# Patient Record
Sex: Female | Born: 1960 | Race: Black or African American | Hispanic: No | Marital: Single | Smoking: Current every day smoker
Health system: Southern US, Community
[De-identification: ages and names within clinical notes are randomized; demographics above are authoritative.]

## PROBLEM LIST (undated history)

## (undated) DIAGNOSIS — J302 Other seasonal allergic rhinitis: Secondary | ICD-10-CM

## (undated) DIAGNOSIS — K219 Gastro-esophageal reflux disease without esophagitis: Secondary | ICD-10-CM

## (undated) DIAGNOSIS — K089 Disorder of teeth and supporting structures, unspecified: Secondary | ICD-10-CM

## (undated) DIAGNOSIS — I509 Heart failure, unspecified: Secondary | ICD-10-CM

## (undated) DIAGNOSIS — I10 Essential (primary) hypertension: Secondary | ICD-10-CM

## (undated) HISTORY — DX: Disorder of teeth and supporting structures, unspecified: K08.9

## (undated) HISTORY — DX: Heart failure, unspecified: I50.9

## (undated) HISTORY — DX: Essential (primary) hypertension: I10

## (undated) HISTORY — DX: Other seasonal allergic rhinitis: J30.2

## (undated) NOTE — *Deleted (*Deleted)
***In Progress*** Referring Physician: Dr. Shirlean Mylar [facility]) Primary Care: Dr. Shirlean Mylar [facility]) Primary Cardiologist: Dr. Eden Emms (not seen since 2016) AHFC: Dr. Gala Romney  HPI:  Angela Serrano is a 89 y/o woman with HTN, GERD with esophageal stricture s/p dilation in 2016 and tobacco use referred by Dr. Leary Roca for further evaluation of her HF.  Saw Dr. Eden Emms in 2016 for moderate pericardial effusion found incidentally on chest CT. TEE 1/16 EF 55-60% and normal valves. Treated with NSAIDs for pleuropericarditis.   Echo 4/21 EF 35-40% global HK grade II DD.   We saw her for the first time in May 2021. Was not feeling well. NYHA III. Underwent R/L cath which looked good. EF 40-45% no CAD. Normal RHC. She was ordered to get a cMRI but this had not been done yet.  She recently presented to clinic 05/20/20 with her daughter. Was doing better from a symptom volume standpoint. Denied exertional dyspnea. No orthopnea/ PND. No LEE. Checked weight every morning. Weight was stable at home, 129 lbs. Reported full medication compliance. Her BP remained elevated, 162/90. SBP in the 150s at home. Was still smoking <1/2 ppd and drinking 3-4 40 oz beers a day.   Today he returns to HF clinic for pharmacist medication titration. At last visit with pharmacy clinic on 06/13/20, carvedilol 3.125 mg BID was initiated and Entresto was increased to 49/51 BID. Patient previously using losartan and Entresto with dizziness/lightheadedness, patient stopped losartan after calling her pharmacy.  . Shortness of breath/dyspnea on exertion? {YES [id number]  . Orthopnea/PND? {YES [id number] . Edema? {YES [id number] . Lightheadedness/dizziness? {YES [id number] . Daily weights at home? {YES [id number] . Blood pressure/heart rate monitoring at home? {YES [id number] . Following low-sodium/fluid-restricted diet? {YES NO:22349}  HF Medications: Carvedilol 3.125 mg BID  Entresto 49/51 mg BID  Spironolactone 25 mg  daily   Has the patient been experiencing any side effects to the medications prescribed?  {YES NO:22349}  Does the patient have any problems obtaining medications due to transportation or finances?   {YES NO:22349}  Understanding of regimen: {excellent/good/fair/poor:19665} Understanding of indications: {excellent/good/fair/poor:19665} Potential of compliance: {excellent/good/fair/poor:19665} Patient understands to avoid NSAIDs. Patient understands to avoid decongestants.    Pertinent Lab Values 05/28/20: . Serum creatinine 0.77, BUN 17, Potassium 3.9, Sodium 142, BNP 209.2 (04/18/20)  Vital Signs: . Weight: *** (last clinic weight: 130 lbs) . Blood pressure: ***  . Heart rate: ***   Assessment: 1. Chronic systolic HF/chest pressure - Echo 4/21 EF 35-40% global HK grade II DD. Unclear etiology suspect HTN +/- ETOH - R/L Cath 5/21 Normal cors, EF 40-45% RHC ok -NYHA II, Volume status ok, BP elevated - Not currently taking any diuretics - Increase carvedilol to 6.25 mg BID? - Continue Entresto to 49/51 mg BID - Continue spironolactone 12.5 mg daily  - Eventual addition of SGLT2i  - we discussed at length need for ETOH cessation and better control of BP  - Working on scheduling cMRI  - continue gradual med titration q2-3 weeks.   2. Severe HTN - suspect cause of CM - remains elevated, 152/86 in clinic - Start carvedilol and increase Entresto as above. Continue spironolactone.  - advised to quit drinking and smoking    3. ETOH use - discussed possibility that this could be cause of reduced EF - she continues to drink 3-4 40 oz beers a day  - advised need for cessation.  - she will work to reduce intake   4. Tobacco use - still  smoking <1/2 ppd  - encouraged cessation   Plan: 1) Medication changes:  2) Follow-up: 3 weeks with Pharmacy Clinic   Plan: 1) Medication changes: Based on clinical presentation, vital signs and recent labs will *** 2) Labs: *** 3)  Follow-up: ***   Karle Plumber, PharmD, BCPS, BCCP, CPP Heart Failure Clinic Pharmacist [phone]

---

## 1988-09-21 HISTORY — PX: TUBAL LIGATION: SHX77

## 1999-05-29 ENCOUNTER — Emergency Department (HOSPITAL_COMMUNITY): Admission: EM | Admit: 1999-05-29 | Discharge: 1999-05-29 | Payer: Self-pay | Admitting: Emergency Medicine

## 2000-05-25 ENCOUNTER — Emergency Department (HOSPITAL_COMMUNITY): Admission: EM | Admit: 2000-05-25 | Discharge: 2000-05-25 | Payer: Self-pay | Admitting: *Deleted

## 2002-09-21 HISTORY — PX: MYOMECTOMY: SHX85

## 2003-04-24 ENCOUNTER — Inpatient Hospital Stay (HOSPITAL_COMMUNITY): Admission: AD | Admit: 2003-04-24 | Discharge: 2003-04-26 | Payer: Self-pay | Admitting: Obstetrics and Gynecology

## 2005-11-20 ENCOUNTER — Other Ambulatory Visit: Admission: RE | Admit: 2005-11-20 | Discharge: 2005-11-20 | Payer: Self-pay | Admitting: Family Medicine

## 2006-08-06 ENCOUNTER — Ambulatory Visit (HOSPITAL_COMMUNITY): Admission: RE | Admit: 2006-08-06 | Discharge: 2006-08-06 | Payer: Self-pay | Admitting: Family Medicine

## 2007-08-03 ENCOUNTER — Ambulatory Visit (HOSPITAL_COMMUNITY): Admission: RE | Admit: 2007-08-03 | Discharge: 2007-08-03 | Payer: Self-pay | Admitting: Family Medicine

## 2007-10-19 ENCOUNTER — Emergency Department (HOSPITAL_COMMUNITY): Admission: EM | Admit: 2007-10-19 | Discharge: 2007-10-19 | Payer: Self-pay | Admitting: Family Medicine

## 2009-03-04 ENCOUNTER — Emergency Department (HOSPITAL_COMMUNITY): Admission: EM | Admit: 2009-03-04 | Discharge: 2009-03-04 | Payer: Self-pay | Admitting: Family Medicine

## 2009-09-21 DIAGNOSIS — J302 Other seasonal allergic rhinitis: Secondary | ICD-10-CM

## 2009-09-21 HISTORY — DX: Other seasonal allergic rhinitis: J30.2

## 2011-02-06 NOTE — H&P (Signed)
NAME:  Angela Serrano, Angela Serrano                              ACCOUNT NO.:  [id number]   MEDICAL RECORD NO.:  [id number]                   PATIENT TYPE:  INP   LOCATION:  NA                                   [facility]ILITY[facility]  [facility]   PHYSICIAN:  Janine Limbo, M.D.            DATE OF BIRTH:  13-Apr-1961   DATE OF ADMISSION:  DATE OF DISCHARGE:                                HISTORY & PHYSICAL   HISTORY OF PRESENT ILLNESS:  [name] is a 50 year old female, para 1-1-1-3,  who presents with large fibroids for an abdominal myomectomy.  The patient  complains of pelvic pressure and pelvic pain.  Her most recent Pap smear was  within normal limits.  Cultures of her cervix are negative.  The patient is  not sexually active, though she does not want to have a hysterectomy at this  time.  An ultrasound was performed that showed a 12.6 x 8.7 x 8.6 cm uterus.  There were multiple fibroids present and the largest measures 7.3 cm in  size.  The patient is status post tubal ligation.  She denies a past history  of sexually transmitted infections.   ALLERGIES:  No known drug allergies.   OBSTETRICAL HISTORY:  The patient has had one full term vaginal delivery and  one preterm vaginal delivery.  She has had twins.  She had one elective  pregnancy termination.   PAST MEDICAL HISTORY:  The patient has a history of anemia.  She also has a  history of sickle cell trait.  She is not currently taking any medications  on a regular basis.   SOCIAL HISTORY:  The patient smokes six cigarettes each day.  She drinks  beer occasionally.  She denies other recreational drug uses.   REVIEW OF SYSTEMS:  Please see history of present illness.   FAMILY HISTORY:  The patient has a family history of asthma, sickle cell  trait, hypertension, diabetes, seizures, migraine headaches, and strokes.   PHYSICAL EXAMINATION:  VITAL SIGNS:  Weight is 113 pounds.  HEENT:  Within normal limits.  CHEST:  Clear.  CARDIOVASCULAR:  Regular rate  and rhythm.  BREASTS:  No masses.  ABDOMEN:  Nontender and there is a firm pelvic mass present.  EXTREMITIES:  Within normal limits.  NEUROLOGIC:  Grossly normal.  PELVIC:  External genitalia is normal. The vagina is normal except for  relaxation.  Cervix is nontender.  The uterus is 12 to 14-week size,  anterior, and firm.  Adnexa has no masses appreciated.  Rectovaginal  examination confirms.   ASSESSMENT:  Symptomatic fibroids.    PLAN:  The patient will undergo an abdominal myomectomy.  She understands  the indications for her procedure and she accepts the risks of, but not  limited to, anesthetic complications, bleeding, infections, and possible  damage to the surrounding organs.  Janine Limbo, M.D.    AVS/MEDQ  D:  04/19/2003  T:  04/19/2003  Job:  [id number]

## 2011-02-06 NOTE — Discharge Summary (Signed)
NAMEACCALIA, Angela Serrano                              ACCOUNT NO.:  [id number]   MEDICAL RECORD NO.:  [id number]                   PATIENT TYPE:  INP   LOCATION:  9109                                 [facility]ACILITY:  [facility]   PHYSICIAN:  Janine Limbo, M.D.            DATE OF BIRTH:  [birth year]   DATE OF ADMISSION:  04/24/2003  DATE OF DISCHARGE:  04/26/2003                                 DISCHARGE SUMMARY   DISCHARGE DIAGNOSES:  Symptomatic uterine fibroids.   OPERATION:  On the date of admission patient underwent an abdominal  myomectomy, tolerating procedure well.  The patient was found to have a  multiple fibroid uterus with three large fibroids, the largest measuring 6 x  8 cm with aggregate weight of 315 g.  The patient had normal appearing tubes  and ovaries bilaterally.  Do note, however, that her tubes had been  previously ligated.   HISTORY OF PRESENT ILLNESS:  Angela Serrano is a 50 year old female para 1-1-1-3  who presents for myomectomy because of large symptomatic uterine fibroids.  Please see patient's dictated history and physical examination for details.   PHYSICAL EXAMINATION:  VITAL SIGNS:  Weight 113 pounds.  GENERAL:  Within normal limits.  ABDOMEN:  Nontender, firm mass arising from the pelvis to the level of her  umbilicus.  PELVIC:  External genitalia is normal.  Vagina is normal except for  relaxation.  Cervix is nontender.  Uterus is 12-14 weeks size, anterior, and  firm.  Adnexa has no appreciable masses.  Rectovaginal examination confirms.   HOSPITAL COURSE:  On the day of admission patient underwent aforementioned  procedure, tolerating it well.  Postoperative course was unremarkable with  patient resuming bowel and bladder function by postoperative day #2 and  therefore deemed ready for discharge home.  Postoperative hemoglobin 10.0  (preoperative hemoglobin 13.2).   DISCHARGE MEDICATIONS:  1. Vicodin one to two tablets q.6h. as needed for pain.  2. Phenergan  25 mg one tablet q.6h. as needed for nausea.  3. Ibuprofen 600 mg one tablet with food q.6h. for five days, then as needed     for pain.  4. Colace 100 mg one tablet b.i.d. until bowel movements are regular.  5. Iron 325 mg one tablet b.i.d. for six weeks.  6. Mylanta Gas chewables one tablet one-half hour after meals and at     bedtime.   DISCHARGE INSTRUCTIONS:  The patient was given a copy of [facility] postoperative sheet.  She was further advised to avoid driving for  two weeks, heavy lifting for four weeks, and intercourse for six weeks.   FOLLOWUP:  Follow-up appointment is scheduled with Janine Limbo, M.D.  on May 31, 2003 at 2:30 p.m.    PATHOLOGY:  Leiomyomata (two) and portion of endometrium with proliferative  phase changes and underlying myometrium with adenomyosis and focal  adenomyoma.  No  atypia or malignancy identified.     Elmira J. Adline Peals.                    Janine Limbo, M.D.    EJP/MEDQ  D:  05/21/2003  T:  05/22/2003  Job:  [id number]

## 2011-02-06 NOTE — Op Note (Signed)
NAME:  Angela Serrano, Angela Serrano                              ACCOUNT NO.:  [id number]   MEDICAL RECORD NO.:  [id number]                   PATIENT TYPE:  INP   LOCATION:  9109                                 [facility]ILITY:  [facility]   PHYSICIAN:  Janine Limbo, M.D.            DATE OF BIRTH:  07/26/1961   DATE OF PROCEDURE:  04/24/2003  DATE OF DISCHARGE:                                 OPERATIVE REPORT   PREOPERATIVE DIAGNOSIS:  Symptomatic fibroids.   POSTOPERATIVE DIAGNOSIS:  Symptomatic fibroids.   PROCEDURE:  Multiple abdominal myomectomy.   SURGEON:  Janine Limbo, M.D.   FIRST ASSISTANT:  Elmira J. Adline Peals.   ANESTHESIA:  General.   DISPOSITION:  Angela Serrano is a 50 year old female, para 1-1-1-3, who presents  with the above-mentioned diagnosis.  She emphatically declines hypertension.  She understands the indications for her procedure, and she accepts the risks  of, but not limited to, anesthetic complications, bleeding, infection, and  possible damage to the surrounding organs.   FINDINGS:  The patient had a multiple fibroid uterus.  The largest fibroid  removed was 6.6 x 8 cm.  The weight was 315 g.  The overall uterus was  approximately 12-14 weeks' size.  The ovaries are normal.  The fallopian  tubes were normal except for defects in the tubes from her prior tubal  ligation.  If the patient should conceive in the future, then a cesarean  section is recommended.   DESCRIPTION OF PROCEDURE:  The patient was taken to the operating room,  where a general anesthetic was given.  The patient's abdomen, perineum, and  vagina were prepped with multiple layers of Betadine.  A Foley catheter was  placed in the bladder.  The patient was sterilely draped.  A low transverse  area was injected with 20 mL of 0.5% Marcaine.  A low transverse incision  was made and carried sharply through the subcutaneous tissue, the fascia,  and the anterior peritoneum.  The abdominal wall retractor was  placed.  The  bowel was packed cephalad.  The uterus was elevated into the operative  field.  A 6 cm incision was made on the anterior portion of the uterus after  injecting the anterior portion of the uterus with Pitressin and saline.  The  incision was extended down to the level of the fibroid.  A large fibroid was  removed using a combination of sharp and blunt dissection.  There was an  area of adenomyosis present that was also sharply removed.  There was a 3 x  4 cm ________ present on the right posterior fundus of the uterus, and this  was removed after injecting the uterine surface with Pitressin and saline.  All defects were then closed using deep sutures of 0 Vicryl followed by  superficial sutures of 3-0 Vicryl.  Hemostasis was noted to be adequate  throughout.  The pelvis  was vigorously irrigated.  Interceed was placed over  the uterine incisions.  All instruments and packs were removed.  The  anterior peritoneum was closed using a running suture of 3-0 Vicryl.  The  fascia was closed using a running suture of 0 Vicryl, followed by three  interrupted sutures of 0 Vicryl.  The subcutaneous tissue was irrigated.  Hemostasis was confirmed.  The skin was reapproximated using  skin staples.  Sponge, needle, and instrument counts were correct on two  occasions.  The estimated blood loss was 150 mL.  The patient tolerated her  procedure well.  She was awakened from her anesthetic without difficulty and  taken to the recovery room in stable condition.  Pictures were taken of the  patient's fibroids.                                               Janine Limbo, M.D.    AVS/MEDQ  D:  04/24/2003  T:  04/25/2003  Job:  [id number]

## 2011-06-19 ENCOUNTER — Emergency Department (HOSPITAL_COMMUNITY)
Admission: EM | Admit: 2011-06-19 | Discharge: 2011-06-19 | Disposition: A | Discharge status: Home / Self Care | Payer: Self-pay | Attending: Emergency Medicine | Admitting: Emergency Medicine

## 2011-06-19 DIAGNOSIS — R220 Localized swelling, mass and lump, head: Secondary | ICD-10-CM | POA: Insufficient documentation

## 2011-06-19 DIAGNOSIS — K089 Disorder of teeth and supporting structures, unspecified: Secondary | ICD-10-CM | POA: Insufficient documentation

## 2011-12-10 ENCOUNTER — Encounter: Payer: Self-pay | Admitting: Obstetrics & Gynecology

## 2011-12-16 ENCOUNTER — Encounter: Payer: Self-pay | Admitting: Advanced Practice Midwife

## 2011-12-24 ENCOUNTER — Encounter: Payer: Self-pay | Admitting: Advanced Practice Midwife

## 2011-12-24 ENCOUNTER — Ambulatory Visit (INDEPENDENT_AMBULATORY_CARE_PROVIDER_SITE_OTHER): Payer: 59 | Admitting: Advanced Practice Midwife

## 2011-12-24 VITALS — BP 129/79 | HR 82 | Temp 97.8°F | Ht 61.0 in | Wt 132.3 lb

## 2011-12-24 DIAGNOSIS — Z01419 Encounter for gynecological examination (general) (routine) without abnormal findings: Secondary | ICD-10-CM

## 2011-12-24 DIAGNOSIS — N6310 Unspecified lump in the right breast, unspecified quadrant: Secondary | ICD-10-CM

## 2011-12-24 DIAGNOSIS — N63 Unspecified lump in unspecified breast: Secondary | ICD-10-CM

## 2011-12-24 NOTE — Patient Instructions (Signed)
Place premenopausal annual exam patient instructions here.  °

## 2011-12-24 NOTE — Progress Notes (Signed)
Patient states has irregular periods, sometimes spots, sometimes light period

## 2011-12-24 NOTE — Progress Notes (Signed)
  Subjective:     Angela Serrano is a 51 y.o. female here for a routine exam.  Current complaints: None.     Gynecologic History Patient's last menstrual period was 12/04/2011. Contraception: abstinence Last Pap: 2009. Results were: normal Last mammogram: 2008. Results were: normal  Obstetric History OB History    Grav Para Term Preterm Abortions TAB SAB Ect Mult Living   3 2 2  1 1          # Outc Date GA Lbr Len/2nd Wgt Sex Del Anes PTL Lv   1 TAB 1982           2 TRM 1988     SVD      3 TRM 1990     SVD          The following portions of the patient's history were reviewed and updated as appropriate: allergies, current medications, past family history, past medical history, past social history, past surgical history and problem list.  Review of Systems A comprehensive review of systems was negative.    Objective:    BP 129/79  Pulse 82  Temp(Src) 97.8 F (36.6 C) (Oral)  Ht 5\' 1"  (1.549 m)  Wt 60.011 kg (132 lb 4.8 oz)  BMI 25.00 kg/m2  LMP 12/04/2011  General Appearance:    Alert, cooperative, no distress, appears stated age  Head:    Normocephalic, without obvious abnormality, atraumatic  Eyes:    PERRL, conjunctiva/corneas clear,         Nose:   Nares normal     Neck:   Supple, symmetrical, trachea midline, no adenopathy;    thyroid:  no enlargement/tenderness/nodules  Back:     Symmetric, no curvature, ROM normal, no CVA tenderness  Lungs:     Clear to auscultation bilaterally, respirations unlabored  Chest Wall:    No tenderness or deformity   Heart:    Regular rate and rhythm, S1 and S2 normal, no murmur, rub   or gallop  Breast Exam:    No tenderness, two 1 x 0.5 cm masses at 6 o'clock on right breast, no nipple abnormality or drainage  Abdomen:     Soft, non-tender, bowel sounds active all four quadrants,    no masses, no organomegaly  Genitalia:    Normal female without lesion, discharge or tenderness     Extremities:   Extremities normal, atraumatic,  no cyanosis or edema     Skin:   Skin color, texture, turgor normal, no rashes or lesions     Neurologic:  A&Ox4, Grossly normal      Assessment:    Healthy female exam.  Right breast mass   Plan:    Mammogram ordered. Follow up in: 1 year. Pap  1. Visit for routine gyn exam  Cytology - PAP  2. Mass of breast, right  MM Digital Diagnostic Bilat, US Breast Right     Biddie Sebek

## 2011-12-28 ENCOUNTER — Telehealth: Payer: Self-pay | Admitting: *Deleted

## 2011-12-28 NOTE — Telephone Encounter (Signed)
Message copied by Mannie Stabile on Mon Dec 28, 2011  3:08 PM ------      Message from: Sherre Lain A      Created: Thu Dec 24, 2011  5:06 PM      Regarding: Appointment       Pt needs an appointment scheduled for a mammogram at the breast center. Order is in EPIC.

## 2011-12-28 NOTE — Telephone Encounter (Signed)
Appointment made for Thursday 12/31/11 at 0800. Patient notified of appointment.

## 2011-12-29 DIAGNOSIS — N6310 Unspecified lump in the right breast, unspecified quadrant: Secondary | ICD-10-CM | POA: Insufficient documentation

## 2011-12-31 ENCOUNTER — Other Ambulatory Visit: Payer: 59

## 2012-01-01 ENCOUNTER — Ambulatory Visit
Admission: RE | Admit: 2012-01-01 | Discharge: 2012-01-01 | Disposition: A | Discharge status: Home / Self Care | Payer: 59 | Source: Ambulatory Visit | Attending: Advanced Practice Midwife | Admitting: Advanced Practice Midwife

## 2012-01-01 DIAGNOSIS — N6310 Unspecified lump in the right breast, unspecified quadrant: Secondary | ICD-10-CM

## 2012-04-06 ENCOUNTER — Encounter: Payer: Self-pay | Admitting: Family Medicine

## 2012-04-06 ENCOUNTER — Ambulatory Visit (INDEPENDENT_AMBULATORY_CARE_PROVIDER_SITE_OTHER): Payer: 59 | Admitting: Family Medicine

## 2012-04-06 VITALS — BP 126/76 | HR 76 | Temp 98.2°F | Ht 61.5 in | Wt 128.0 lb

## 2012-04-06 DIAGNOSIS — F17200 Nicotine dependence, unspecified, uncomplicated: Secondary | ICD-10-CM | POA: Insufficient documentation

## 2012-04-06 DIAGNOSIS — Z23 Encounter for immunization: Secondary | ICD-10-CM

## 2012-04-06 DIAGNOSIS — Z720 Tobacco use: Secondary | ICD-10-CM

## 2012-04-06 DIAGNOSIS — N63 Unspecified lump in unspecified breast: Secondary | ICD-10-CM

## 2012-04-06 DIAGNOSIS — Z0000 Encounter for general adult medical examination without abnormal findings: Secondary | ICD-10-CM | POA: Insufficient documentation

## 2012-04-06 DIAGNOSIS — N6310 Unspecified lump in the right breast, unspecified quadrant: Secondary | ICD-10-CM

## 2012-04-06 LAB — COMPREHENSIVE METABOLIC PANEL
Albumin: 4.2 g/dL (ref 3.5–5.2)
Alkaline Phosphatase: 68 U/L (ref 39–117)
BUN: 14 mg/dL (ref 6–23)
CO2: 26 mEq/L (ref 19–32)
Calcium: 9.2 mg/dL (ref 8.4–10.5)
Glucose, Bld: 69 mg/dL — ABNORMAL LOW (ref 70–99)
Potassium: 4.2 mEq/L (ref 3.5–5.3)
Sodium: 138 mEq/L (ref 135–145)
Total Protein: 6.9 g/dL (ref 6.0–8.3)

## 2012-04-06 LAB — LIPID PANEL
Cholesterol: 174 mg/dL (ref 0–200)
Total CHOL/HDL Ratio: 2.7 Ratio

## 2012-04-06 MED ORDER — NICOTINE 14 MG/24HR TD PT24: 1.0000 | MEDICATED_PATCH | TRANSDERMAL | Status: AC

## 2012-04-06 NOTE — Assessment & Plan Note (Addendum)
A: ready to quit. P: per AVS.  For smoking cessation: 1. Set quit date: an important day is a good one. 2. Tell family and friends for support.  3.  Nicotine patch 14 mg patch daily x 4 weeks, 7 mg patch x 2 weeks.  4. Smoking cessation support: smoking cessation hotline: 1-800-QUIT-NOW.  Here is the number to the smoking cessation classes at [facility]: [phone]

## 2012-04-06 NOTE — Assessment & Plan Note (Signed)
Benign with negative breast ultrasound and mammogram. P: f.u mammogram in 2014.

## 2012-04-06 NOTE — Patient Instructions (Addendum)
Ms. Caponigro,  Thank you for coming in to see me today.  For smoking cessation: 1. Set quit date: an important day is a good one. 2. Tell family and friends for support.  3.  Nicotine patch 14 mg patch daily x 4 weeks, 7 mg patch x 2 weeks.  4. Smoking cessation support: smoking cessation hotline: 1-800-QUIT-NOW.  Here is the number to the smoking cessation classes at [facility]: 161-0960  You can pick any dentist for your dental work: check out [facility] but shop around because they are a bit pricey.   Please come back in the fall for flu shot.  Follow up yearly for physical.  I will call or send a letter with your blood work results.  Dr. Armen Pickup

## 2012-04-06 NOTE — Assessment & Plan Note (Signed)
screening non-fasting lipid panel and CMP. Tetanus shot today.

## 2012-04-06 NOTE — Progress Notes (Signed)
Subjective:     Patient ID: Angela Serrano, female   DOB: 1960/11/13, 51 y.o.   MRN: [id number]  HPI 51 yo F new patient presents to establish primary care and discuss the following:  1. Smoking: 1/4-1/2 PPD x 30 years. Lives with sister who also smokes. Has not been able to quit for longer than 2 weeks since starting at age 53. Does not wake up to smoke. She does smoke and have coffee in the morning. Best quit attempt with nicotine patch. Denies productive cough and chest pain.   2. Poor dentition: x many years. Desires teeth extraction and dentures.   Past Medical History  Diagnosis Date  . Seasonal allergies 2011    sneezing and hoarseness  . Poor dentition     Past Surgical History  Procedure Date  . Myomectomy 2001  . Tubal ligation 1990   History   Social History  . Marital Status: Single    Spouse Name: N/A    Number of Children: 3  . Years of Education: N/A   Occupational History  . Guest Services  [employer]    [employer]    Social History Main Topics  . Smoking status: Current Everyday Smoker -- 0.2 packs/day    Types: Cigarettes  . Smokeless tobacco: Never Used  . Alcohol Use: Yes     Social, not weekly.   . Drug Use: No  . Sexually Active: No   Other Topics Concern  . Not on file   Social History Narrative  . No narrative on file   Review of Systems GEN: denies fatigue and weight loss.  HEENT: tooth pain, sneezing, hoarse voice.  Chest: denies CP, palpitations, SOB,  GI: negative  MSK: negative NEURO: admits to an episode of dizziness and hypertension.     Objective:   Physical Exam BP 126/76  Pulse 76  Temp 98.2 F (36.8 C) (Oral)  Ht 5' 1.5" (1.562 m)  Wt 128 lb (58.06 kg)  BMI 23.79 kg/m2  LMP 02/05/2012 General appearance: alert, cooperative and no distress Eyes: conjunctivae/corneas clear. PERRL, EOM's intact.  Throat: abnormal findings: dentition: poor and periodontitis Neck: no adenopathy, no carotid bruit, no JVD, supple,  symmetrical, trachea midline and thyroid not enlarged, symmetric, no tenderness/mass/nodules Lungs: clear to auscultation bilaterally Heart: regular rate and rhythm, S1, S2 normal, no murmur, click, rub or gallop Extremities: extremities normal, atraumatic, no cyanosis or edema Pulses: 2+ and symmetric Neurologic: Grossly normal  Assessment and Plan:

## 2012-11-24 ENCOUNTER — Other Ambulatory Visit: Payer: Self-pay | Admitting: Advanced Practice Midwife

## 2012-11-24 ENCOUNTER — Other Ambulatory Visit: Payer: Self-pay

## 2013-01-02 ENCOUNTER — Ambulatory Visit
Admission: RE | Admit: 2013-01-02 | Discharge: 2013-01-02 | Disposition: A | Discharge status: Home / Self Care | Payer: 59 | Source: Ambulatory Visit

## 2013-01-02 DIAGNOSIS — Z1231 Encounter for screening mammogram for malignant neoplasm of breast: Secondary | ICD-10-CM

## 2013-01-11 ENCOUNTER — Encounter: Payer: Self-pay | Admitting: Family Medicine

## 2013-01-11 ENCOUNTER — Ambulatory Visit (INDEPENDENT_AMBULATORY_CARE_PROVIDER_SITE_OTHER): Payer: 59 | Admitting: Family Medicine

## 2013-01-11 ENCOUNTER — Ambulatory Visit (HOSPITAL_COMMUNITY)
Admission: RE | Admit: 2013-01-11 | Discharge: 2013-01-11 | Disposition: A | Discharge status: Home / Self Care | Payer: 59 | Source: Ambulatory Visit | Attending: Family Medicine | Admitting: Family Medicine

## 2013-01-11 VITALS — BP 120/80 | HR 92 | Temp 98.3°F | Ht 61.5 in | Wt 125.0 lb

## 2013-01-11 DIAGNOSIS — F17200 Nicotine dependence, unspecified, uncomplicated: Secondary | ICD-10-CM

## 2013-01-11 DIAGNOSIS — J309 Allergic rhinitis, unspecified: Secondary | ICD-10-CM

## 2013-01-11 DIAGNOSIS — M542 Cervicalgia: Secondary | ICD-10-CM

## 2013-01-11 DIAGNOSIS — R079 Chest pain, unspecified: Secondary | ICD-10-CM | POA: Insufficient documentation

## 2013-01-11 DIAGNOSIS — K219 Gastro-esophageal reflux disease without esophagitis: Secondary | ICD-10-CM

## 2013-01-11 DIAGNOSIS — R030 Elevated blood-pressure reading, without diagnosis of hypertension: Secondary | ICD-10-CM

## 2013-01-11 DIAGNOSIS — Z720 Tobacco use: Secondary | ICD-10-CM

## 2013-01-11 DIAGNOSIS — J302 Other seasonal allergic rhinitis: Secondary | ICD-10-CM | POA: Insufficient documentation

## 2013-01-11 DIAGNOSIS — G478 Other sleep disorders: Secondary | ICD-10-CM

## 2013-01-11 LAB — COMPREHENSIVE METABOLIC PANEL
ALT: 10 U/L (ref 0–35)
AST: 15 U/L (ref 0–37)
Albumin: 3.9 g/dL (ref 3.5–5.2)
Alkaline Phosphatase: 67 U/L (ref 39–117)
BUN: 7 mg/dL (ref 6–23)
Calcium: 9.1 mg/dL (ref 8.4–10.5)
Chloride: 104 mEq/L (ref 96–112)
Potassium: 4.5 mEq/L (ref 3.5–5.3)
Sodium: 138 mEq/L (ref 135–145)
Total Protein: 6.6 g/dL (ref 6.0–8.3)

## 2013-01-11 LAB — CBC
MCH: 29.4 pg (ref 26.0–34.0)
MCHC: 33.1 g/dL (ref 30.0–36.0)
Platelets: 361 10*3/uL (ref 150–400)

## 2013-01-11 LAB — LIPID PANEL: HDL: 71 mg/dL (ref >39)

## 2013-01-11 MED ORDER — LORATADINE 10 MG PO TABS: 10.0000 mg | ORAL_TABLET | Freq: Every day | ORAL | Status: DC

## 2013-01-11 MED ORDER — SALINE NASAL SPRAY 0.65 % NA SOLN: 1.0000 | NASAL | Status: DC | PRN

## 2013-01-11 MED ORDER — PANTOPRAZOLE SODIUM 40 MG PO TBEC: 40.0000 mg | DELAYED_RELEASE_TABLET | Freq: Every day | ORAL | Status: DC

## 2013-01-11 MED ORDER — VARENICLINE TARTRATE 1 MG PO TABS: 1.0000 mg | ORAL_TABLET | Freq: Two times a day (BID) | ORAL | Status: DC

## 2013-01-11 MED ORDER — VARENICLINE TARTRATE 0.5 MG PO TABS: 0.5000 mg | ORAL_TABLET | Freq: Two times a day (BID) | ORAL | Status: DC

## 2013-01-11 MED ORDER — GABAPENTIN 100 MG PO CAPS: 100.0000 mg | ORAL_CAPSULE | Freq: Every day | ORAL | Status: DC

## 2013-01-11 NOTE — Assessment & Plan Note (Signed)
A: suspect muscle spasm and cervicalgia.  P:  Neck x-ray Nightly gabapentin

## 2013-01-11 NOTE — Progress Notes (Signed)
Subjective:     Patient ID: Angela Serrano, female   DOB: July 01, 1961, 52 y.o.   MRN: [id number]  HPI 52 yo F presents for physical to discuss the following:  1. Insomnia: x one year. Sleeping 6 hrs per night. Sleep interrupted. Usually wakes up at 2 AM to use restroom and eat chocolate. Does not wake up to smoke. Has attempted to self treat with OTC sleep aid and nightly beer without improvement. Sleeps alone.   2. Neck pain: x 10 months. Once per month. Bilateral neck pulling sensation. Does not radiate. Worse with palpation. Denies neck injury. Admits to work related stress. Does have intermittent epigastric pain associated with increased gas and belching. Denies associated palpitations, SOB, dizziness, lightheadedness, nausea and vomiting.   3. Hoarseness: x 2-3 days. associated with sneezing and watery/ithcy eyes. Denies fever and sore throat.   4. Smoking: ready to quit. Smoking 5-6 cigarettes per day. Denies chronic cough. Has tried wellbutrin x 3 doses in the past with side effects. Tried nicotine replacement many years ago.   5. Health maintenance: normal screening mammogram this year. Normal pap in 2013.   Review of Systems Patient Information Form: Screening and ROS  AUDIT-C Score: 3 Do you feel safe in relationships? yes PHQ-2:positive   Review of Symptoms  General:  Negative for unexplained weight loss, fever Skin: Negative for new or changing mole, sore that won't heal HEENT:  Positive for hoarseness/changing voice. Negative for trouble hearing, trouble seeing, ringing in ears, mouth sore, dysphagia. CV:  Positive for chest pain. Negative for dyspnea, edema, palpitations Resp: Negative for cough, dyspnea, hemoptysis GI: Negative for nausea, vomiting, diarrhea, constipation, abdominal pain, melena, hematochezia. GU: Positive for leakage of urine. Negative for dysuria, urinary hesitance, hematuria, vaginal or penile discharge, polyuria, sexual difficulty, lumps in testicle or  breasts MSK: Positive for muscle cramps or aches. Negative for joint pain or swelling Neuro: Negative for headaches, weakness, numbness, dizziness, passing out/fainting Psych: Positive for anxiety/stress. Negative for depression, memory problems    Objective:   Physical Exam BP 120/80  Pulse 92  Temp(Src) 98.3 F (36.8 C) (Oral)  Ht 5' 1.5" (1.562 m)  Wt 125 lb (56.7 kg)  BMI 23.24 kg/m2  LMP 10/22/2012 General appearance: alert, cooperative and no distress Eyes: conjunctivae/corneas clear. PERRL, EOM's intact.  Ears: normal TM's and external ear canals both ears Nose: Nares normal. Septum midline. Mucosa normal. No drainage or sinus tenderness. Throat: lips, mucosa, and tongue normal; teeth and gums normal upper teeth, dentures. Poor dentition lower teeth.  Neck: no adenopathy, no carotid bruit, no JVD, supple, symmetrical, trachea midline and thyroid not enlarged, symmetric, no tenderness/mass/nodules Lungs: clear to auscultation bilaterally Heart: regular rate and rhythm, S1, S2 normal, no murmur, click, rub or gallop Abdomen: soft, non-tender; bowel sounds normal; no masses,  no organomegaly Extremities: extremities normal, atraumatic, no cyanosis or edema Neurologic: Grossly normal  EKG: NSR, rate 79, normal axis, good r wave progression. No ST segment elevation or depression.      Assessment and Plan

## 2013-01-11 NOTE — Assessment & Plan Note (Signed)
A: Gerd symptoms. P: gerd diet PPI per orders

## 2013-01-11 NOTE — Assessment & Plan Note (Signed)
A: improved on recheck. Normal resting EKG.  P: advised low salt diet and regular exercise.

## 2013-01-11 NOTE — Patient Instructions (Addendum)
Angela Serrano,  Thank you for coming in today.  For your hoarseness and heartburn: avoid acidic foods (tomato sauce), caffeine, overeating. Start protonix daily. You can take as needed if symptoms improve.  For poor sleep:  Make sure to avoid TV and reading in bed.  Continue regular exercise. Urinate before bed. Start gabapentin 1 tab nightly, work up to 3 tabs night over one week. This will reduce upper shoulder pain.    For allergy symptoms: Start with nasal saline, multiple uses as needed for congestion/nose irritation and daily antihistamine.   F/u in 1 month for smoking cessation and sleep f/u.   Dr. Armen Pickup

## 2013-01-11 NOTE — Assessment & Plan Note (Signed)
A: ready to quit. P: Chantix, starter pack. Set quit date 8-35 days after starting chantix. Counseled regarding SI. F/u in one month

## 2013-01-11 NOTE — Assessment & Plan Note (Signed)
A: poor sleep latency  P:  Discussed improved sleep hygiene Nightly gabapentin should help as well.

## 2013-01-11 NOTE — Assessment & Plan Note (Signed)
A: seasonal allergies P: Oral antihistamine Nasal saline Advance to flonase if limited improvement with above.

## 2013-01-15 ENCOUNTER — Encounter: Payer: Self-pay | Admitting: Family Medicine

## 2013-09-27 ENCOUNTER — Emergency Department (HOSPITAL_COMMUNITY): Payer: Self-pay

## 2013-09-27 ENCOUNTER — Emergency Department (HOSPITAL_COMMUNITY)
Admission: EM | Admit: 2013-09-27 | Discharge: 2013-09-28 | Disposition: A | Discharge status: Home Health | Payer: Self-pay | Attending: Emergency Medicine | Admitting: Emergency Medicine

## 2013-09-27 ENCOUNTER — Encounter (HOSPITAL_COMMUNITY): Payer: Self-pay | Admitting: Emergency Medicine

## 2013-09-27 DIAGNOSIS — R079 Chest pain, unspecified: Secondary | ICD-10-CM | POA: Insufficient documentation

## 2013-09-27 DIAGNOSIS — F17200 Nicotine dependence, unspecified, uncomplicated: Secondary | ICD-10-CM | POA: Insufficient documentation

## 2013-09-27 DIAGNOSIS — Z8719 Personal history of other diseases of the digestive system: Secondary | ICD-10-CM | POA: Insufficient documentation

## 2013-09-27 DIAGNOSIS — Z8709 Personal history of other diseases of the respiratory system: Secondary | ICD-10-CM | POA: Insufficient documentation

## 2013-09-27 HISTORY — DX: Gastro-esophageal reflux disease without esophagitis: K21.9

## 2013-09-27 LAB — BASIC METABOLIC PANEL
BUN: 10 mg/dL (ref 6–23)
CALCIUM: 10 mg/dL (ref 8.4–10.5)
CO2: 27 meq/L (ref 19–32)
Chloride: 101 mEq/L (ref 96–112)
Creatinine, Ser: 0.58 mg/dL (ref 0.50–1.10)
GFR calc Af Amer: >90 mL/min (ref >90)
GFR calc non Af Amer: >90 mL/min (ref >90)
GLUCOSE: 93 mg/dL (ref 70–99)
Potassium: 4.8 mEq/L (ref 3.7–5.3)
SODIUM: 143 meq/L (ref 137–147)

## 2013-09-27 LAB — CBC
HEMATOCRIT: 41.3 % (ref 36.0–46.0)
HEMOGLOBIN: 14.4 g/dL (ref 12.0–15.0)
MCH: 31.7 pg (ref 26.0–34.0)
MCHC: 34.9 g/dL (ref 30.0–36.0)
MCV: 91 fL (ref 78.0–100.0)
Platelets: 384 10*3/uL (ref 150–400)
RBC: 4.54 MIL/uL (ref 3.87–5.11)
RDW: 13.3 % (ref 11.5–15.5)
WBC: 10.6 10*3/uL — ABNORMAL HIGH (ref 4.0–10.5)

## 2013-09-27 LAB — TROPONIN I

## 2013-09-27 LAB — POCT I-STAT TROPONIN I: Troponin i, poc: 0.01 ng/mL (ref 0.00–0.08)

## 2013-09-27 LAB — D-DIMER, QUANTITATIVE: D-Dimer, Quant: 1.39 ug/mL-FEU — ABNORMAL HIGH (ref 0.00–0.48)

## 2013-09-27 MED ORDER — SODIUM CHLORIDE 0.9 % IV SOLN
INTRAVENOUS | Status: DC
  Administered 2013-09-27: 22:00:00 via INTRAVENOUS

## 2013-09-27 MED ORDER — SODIUM CHLORIDE 0.9 % IV BOLUS (SEPSIS)
250.0000 mL | Freq: Once | INTRAVENOUS | Status: AC
  Administered 2013-09-27: 250 mL via INTRAVENOUS

## 2013-09-27 MED ORDER — IOHEXOL 350 MG/ML SOLN
80.0000 mL | Freq: Once | INTRAVENOUS | Status: AC | PRN
  Administered 2013-09-27: 57 mL via INTRAVENOUS

## 2013-09-27 NOTE — ED Notes (Signed)
Patient transported to CT 

## 2013-09-27 NOTE — ED Notes (Signed)
Pt reports chest pain since last Thursday and reports that felt like she need to burp but then last nite felt her heart fluttering

## 2013-09-28 MED ORDER — TRAMADOL HCL 50 MG PO TABS
50.0000 mg | ORAL_TABLET | Freq: Four times a day (QID) | ORAL | Status: DC | PRN
Start: 2013-09-28 — End: 2020-02-13

## 2013-09-28 MED ORDER — HYDROCODONE-ACETAMINOPHEN 5-325 MG PO TABS
2.0000 | ORAL_TABLET | ORAL | Status: DC | PRN
Start: 2013-09-28 — End: 2014-09-26

## 2013-09-28 NOTE — ED Provider Notes (Signed)
CSN: [id number]     Arrival date & time 09/27/13  1831 History   First MD Initiated Contact with Patient 09/27/13 2059     Chief Complaint  Patient presents with  . Chest Pain   (Consider location/radiation/quality/duration/timing/severity/associated sxs/prior Treatment) Patient is a 53 y.o. female presenting with chest pain. The history is provided by the patient.  Chest Pain Associated symptoms: no back pain, no fever, no headache and no shortness of breath    patient with onset of chest pain one week ago. Substernal pain currently 4/10 at its worse is 10 out of 10. Radiates occasionally to the left neck. No nausea vomiting no shortness of breath. No fevers or no coronary artery disease. Cardiac risk factors only smoking. Patient is [facility]. Pain is described as an ache occasionally gets sharp. Pain is been constant but waxes and wanes. Never gone away completely.  Past Medical History  Diagnosis Date  . Seasonal allergies 2011    sneezing and hoarseness  . Poor dentition   . Acid reflux disease    Past Surgical History  Procedure Laterality Date  . Myomectomy  2001  . Tubal ligation  1990   Family History  Problem Relation Age of Onset  . Cancer Mother 35    Bone  . Alcohol abuse Father 17  . Hypertension Sister   . Hyperlipidemia Sister   . Hypertension Sister   . Thyroid disease Daughter   . Diabetes Maternal Aunt   . Cancer Maternal Aunt 60    Bone   . Diabetes Maternal Grandmother   . Cancer Maternal Aunt 63    Bone    History  Substance Use Topics  . Smoking status: Current Every Day Smoker -- 0.25 packs/day for 30 years    Types: Cigarettes  . Smokeless tobacco: Never Used  . Alcohol Use: Yes     Comment: one beer per night for sleep   OB History   Grav Para Term Preterm Abortions TAB SAB Ect Mult Living   3 2 2  1 1          Review of Systems  Constitutional: Negative for fever.  HENT: Negative for congestion.   Eyes:  Negative for redness.  Respiratory: Negative for shortness of breath.   Cardiovascular: Positive for chest pain.  Genitourinary: Negative for dysuria.  Musculoskeletal: Negative for back pain.  Skin: Negative for rash.  Neurological: Negative for headaches.  Hematological: Does not bruise/bleed easily.  Psychiatric/Behavioral: Negative for confusion.    Allergies  Review of patient's allergies indicates no known allergies.  Home Medications   Current Outpatient Rx  Name  Route  Sig  Dispense  Refill  . HYDROcodone-acetaminophen (NORCO/VICODIN) 5-325 MG per tablet   Oral   Take 2 tablets by mouth every 4 (four) hours as needed for moderate pain.   14 tablet   0   . traMADol (ULTRAM) 50 MG tablet   Oral   Take 1 tablet (50 mg total) by mouth every 6 (six) hours as needed.   20 tablet   0    BP 135/90  Pulse 94  Temp(Src) 98 F (36.7 C) (Oral)  Resp 15  SpO2 100%  LMP 04/27/2013 Physical Exam  Nursing note and vitals reviewed. Constitutional: She is oriented to person, place, and time. She appears well-developed and well-nourished. No distress.  HENT:  Head: Normocephalic and atraumatic.  Mouth/Throat: Oropharynx is clear and moist.  Eyes: Conjunctivae and EOM are normal. Pupils  are equal, round, and reactive to light.  Neck: Normal range of motion. Neck supple.  Cardiovascular: Normal rate, regular rhythm and normal heart sounds.   No murmur heard. Pulmonary/Chest: Effort normal and breath sounds normal. No respiratory distress.  Abdominal: Soft. Bowel sounds are normal. There is no tenderness.  Musculoskeletal: Normal range of motion. She exhibits no edema.  Neurological: She is alert and oriented to person, place, and time. No cranial nerve deficit. She exhibits normal muscle tone. Coordination normal.  Skin: Skin is warm. No rash noted.    ED Course  Procedures (including critical care time) Labs Review Labs Reviewed  CBC - Abnormal; Notable for the  following:    WBC 10.6 (*)    All other components within normal limits  D-DIMER, QUANTITATIVE - Abnormal; Notable for the following:    D-Dimer, Quant 1.39 (*)    All other components within normal limits  BASIC METABOLIC PANEL  TROPONIN I  POCT I-STAT TROPONIN I   Results for orders placed during the hospital encounter of 09/27/13  CBC      Result Value Range   WBC 10.6 (*) 4.0 - 10.5 K/uL   RBC 4.54  3.87 - 5.11 MIL/uL   Hemoglobin 14.4  12.0 - 15.0 g/dL   HCT 41.3  36.0 - 46.0 %   MCV 91.0  78.0 - 100.0 fL   MCH 31.7  26.0 - 34.0 pg   MCHC 34.9  30.0 - 36.0 g/dL   RDW 13.3  11.5 - 15.5 %   Platelets 384  150 - 400 K/uL  BASIC METABOLIC PANEL      Result Value Range   Sodium 143  137 - 147 mEq/L   Potassium 4.8  3.7 - 5.3 mEq/L   Chloride 101  96 - 112 mEq/L   CO2 27  19 - 32 mEq/L   Glucose, Bld 93  70 - 99 mg/dL   BUN 10  6 - 23 mg/dL   Creatinine, Ser 0.58  0.50 - 1.10 mg/dL   Calcium 10.0  8.4 - 10.5 mg/dL   GFR calc non Af Amer >90  >90 mL/min   GFR calc Af Amer >90  >90 mL/min  TROPONIN I      Result Value Range   Troponin I <0.30  <0.30 ng/mL  D-DIMER, QUANTITATIVE      Result Value Range   D-Dimer, Quant 1.39 (*) 0.00 - 0.48 ug/mL-FEU  POCT I-STAT TROPONIN I      Result Value Range   Troponin i, poc 0.01  0.00 - 0.08 ng/mL   Comment 3             Imaging Review Dg Chest 2 View  09/27/2013   CLINICAL DATA:  Heartburn, and is suggestion, belching, chest pressure especially when lying on left side, shortness of breath, history smoking  EXAM: CHEST  2 VIEW  COMPARISON:  None  FINDINGS: Normal heart size, mediastinal contours, and pulmonary vascularity.  Minimal subsegmental atelectasis at lung bases.  Lungs otherwise clear.  No infiltrate, pleural effusion or pneumothorax.  Bones unremarkable.  IMPRESSION: Minimal bibasilar atelectasis.   Electronically Signed   By: Lavonia Dana M.D.   On: 09/27/2013 19:27   Ct Angio Chest Pe W/cm &/or Wo Cm  09/27/2013    CLINICAL DATA:  Chest pain  EXAM: CT ANGIOGRAPHY CHEST WITH CONTRAST  TECHNIQUE: Multidetector CT imaging of the chest was performed using the standard protocol during bolus administration of intravenous contrast. Multiplanar CT  image reconstructions including MIPs were obtained to evaluate the vascular anatomy.  CONTRAST:  30mL OMNIPAQUE IOHEXOL 350 MG/ML SOLN  COMPARISON:  Chest radiograph September 27, 2013  FINDINGS: There is no demonstrable pulmonary embolus. There is no appreciable thoracic aortic aneurysm or dissection.  There is underlying centrilobular emphysema. There is no edema or consolidation. There is mild subsegmental atelectasis in the lung bases.  There is no demonstrable thoracic adenopathy. The pericardium is not thickened.  Visualized upper abdominal structures appear normal. There are no blastic or lytic bone lesions. Visualized thyroid appears normal.  Review of the MIP images confirms the above findings.  IMPRESSION: No demonstrable pulmonary embolus. No edema or consolidation. There is a degree of underlying centrilobular emphysema.   Electronically Signed   By: Lowella Grip M.D.   On: 09/27/2013 23:32    EKG Interpretation    Date/Time:  Wednesday September 27 2013 18:34:26 EST Ventricular Rate:  116 PR Interval:  150 QRS Duration: 64 QT Interval:  314 QTC Calculation: 436 R Axis:   65 Text Interpretation:  Sinus tachycardia Right atrial enlargement Borderline ECG Confirmed by Nevayah Faust  MD, Marbella Markgraf ([id number]) on 09/27/2013 9:33:38 PM            MDM   1. Chest pain     Patient with chest pain since last Thursday. Workup here negative for any acute cardiac event. D-dimer was elevated but CT angiogram ruled out pulmonary embolism. No evidence of pulmonary edema no evidence of pneumonia. Patient can followup as an outpatient with her primary care Dr. will start a baby aspirin a day. And provide pain medication for the chest pain. Patient will return for any new or worse  symptoms. Patient has no cardiac risk factors. Other than a smoker.   Mervin Kung, MD 09/28/13 [phone]

## 2013-09-28 NOTE — Discharge Instructions (Signed)
Recommend starting a baby aspirin a day. Workup for the chest pain here was negative. If the chest pain gets worse return. Followup with your regular Dr. in the next few days. Take pain medication provided as needed. Tramadol we for mild pain. Norco would be for more severe pain.

## 2014-01-12 ENCOUNTER — Other Ambulatory Visit: Payer: Self-pay | Admitting: Family Medicine

## 2014-07-23 ENCOUNTER — Encounter (HOSPITAL_COMMUNITY): Payer: Self-pay | Admitting: Emergency Medicine

## 2014-09-22 ENCOUNTER — Observation Stay (HOSPITAL_COMMUNITY)
Admission: EM | Admit: 2014-09-22 | Discharge: 2014-09-26 | Disposition: A | Discharge status: Home / Self Care | Payer: Self-pay | Attending: Family Medicine | Admitting: Family Medicine

## 2014-09-22 ENCOUNTER — Emergency Department (HOSPITAL_COMMUNITY): Payer: Self-pay

## 2014-09-22 ENCOUNTER — Encounter (HOSPITAL_COMMUNITY): Payer: Self-pay

## 2014-09-22 DIAGNOSIS — R079 Chest pain, unspecified: Secondary | ICD-10-CM | POA: Diagnosis present

## 2014-09-22 DIAGNOSIS — R09A2 Foreign body sensation, throat: Secondary | ICD-10-CM | POA: Insufficient documentation

## 2014-09-22 DIAGNOSIS — R319 Hematuria, unspecified: Secondary | ICD-10-CM | POA: Insufficient documentation

## 2014-09-22 DIAGNOSIS — R1310 Dysphagia, unspecified: Principal | ICD-10-CM | POA: Insufficient documentation

## 2014-09-22 DIAGNOSIS — R071 Chest pain on breathing: Secondary | ICD-10-CM | POA: Insufficient documentation

## 2014-09-22 DIAGNOSIS — F17210 Nicotine dependence, cigarettes, uncomplicated: Secondary | ICD-10-CM | POA: Insufficient documentation

## 2014-09-22 DIAGNOSIS — K222 Esophageal obstruction: Secondary | ICD-10-CM | POA: Insufficient documentation

## 2014-09-22 DIAGNOSIS — R10A2 Flank pain, left side: Secondary | ICD-10-CM | POA: Insufficient documentation

## 2014-09-22 DIAGNOSIS — R109 Unspecified abdominal pain: Secondary | ICD-10-CM | POA: Insufficient documentation

## 2014-09-22 DIAGNOSIS — R933 Abnormal findings on diagnostic imaging of other parts of digestive tract: Secondary | ICD-10-CM | POA: Insufficient documentation

## 2014-09-22 DIAGNOSIS — R7989 Other specified abnormal findings of blood chemistry: Secondary | ICD-10-CM

## 2014-09-22 DIAGNOSIS — K219 Gastro-esophageal reflux disease without esophagitis: Secondary | ICD-10-CM | POA: Insufficient documentation

## 2014-09-22 DIAGNOSIS — R0989 Other specified symptoms and signs involving the circulatory and respiratory systems: Secondary | ICD-10-CM | POA: Insufficient documentation

## 2014-09-22 DIAGNOSIS — I3139 Other pericardial effusion (noninflammatory): Secondary | ICD-10-CM | POA: Insufficient documentation

## 2014-09-22 DIAGNOSIS — I313 Pericardial effusion (noninflammatory): Secondary | ICD-10-CM | POA: Insufficient documentation

## 2014-09-22 DIAGNOSIS — R509 Fever, unspecified: Secondary | ICD-10-CM | POA: Insufficient documentation

## 2014-09-22 DIAGNOSIS — K2960 Other gastritis without bleeding: Secondary | ICD-10-CM | POA: Insufficient documentation

## 2014-09-22 LAB — URINALYSIS, ROUTINE W REFLEX MICROSCOPIC
Glucose, UA: NEGATIVE mg/dL
Ketones, ur: NEGATIVE mg/dL
Leukocytes, UA: NEGATIVE
NITRITE: NEGATIVE
PROTEIN: NEGATIVE mg/dL
Specific Gravity, Urine: 1.025 (ref 1.005–1.030)
UROBILINOGEN UA: 1 mg/dL (ref 0.0–1.0)
pH: 5.5 (ref 5.0–8.0)

## 2014-09-22 LAB — BRAIN NATRIURETIC PEPTIDE: B Natriuretic Peptide: 19.6 pg/mL (ref 0.0–100.0)

## 2014-09-22 LAB — CBC WITH DIFFERENTIAL/PLATELET
Basophils Absolute: 0 10*3/uL (ref 0.0–0.1)
Basophils Relative: 0 % (ref 0–1)
EOS ABS: 0 10*3/uL (ref 0.0–0.7)
Eosinophils Relative: 0 % (ref 0–5)
HEMATOCRIT: 36.7 % (ref 36.0–46.0)
HEMOGLOBIN: 12.1 g/dL (ref 12.0–15.0)
Lymphocytes Relative: 14 % (ref 12–46)
Lymphs Abs: 1.7 10*3/uL (ref 0.7–4.0)
MCH: 30.3 pg (ref 26.0–34.0)
MCHC: 33 g/dL (ref 30.0–36.0)
MCV: 91.8 fL (ref 78.0–100.0)
MONOS PCT: 9 % (ref 3–12)
Monocytes Absolute: 1.1 10*3/uL — ABNORMAL HIGH (ref 0.1–1.0)
NEUTROS PCT: 77 % (ref 43–77)
Neutro Abs: 8.9 10*3/uL — ABNORMAL HIGH (ref 1.7–7.7)
PLATELETS: 443 10*3/uL — AB (ref 150–400)
RBC: 4 MIL/uL (ref 3.87–5.11)
RDW: 12.3 % (ref 11.5–15.5)
WBC: 11.7 10*3/uL — AB (ref 4.0–10.5)

## 2014-09-22 LAB — BASIC METABOLIC PANEL
Anion gap: 7 (ref 5–15)
BUN: 11 mg/dL (ref 6–23)
CO2: 28 mmol/L (ref 19–32)
Calcium: 9 mg/dL (ref 8.4–10.5)
Chloride: 106 mEq/L (ref 96–112)
Creatinine, Ser: 0.57 mg/dL (ref 0.50–1.10)
GFR calc Af Amer: >90 mL/min (ref >90)
Glucose, Bld: 101 mg/dL — ABNORMAL HIGH (ref 70–99)
Potassium: 4.2 mmol/L (ref 3.5–5.1)
SODIUM: 141 mmol/L (ref 135–145)

## 2014-09-22 LAB — URINE MICROSCOPIC-ADD ON

## 2014-09-22 LAB — D-DIMER, QUANTITATIVE (NOT AT ARMC): D-Dimer, Quant: 3 ug/mL-FEU — ABNORMAL HIGH (ref 0.00–0.48)

## 2014-09-22 LAB — TROPONIN I: Troponin I: <0.03 ng/mL (ref <0.031)

## 2014-09-22 LAB — TSH: TSH: 1.183 u[IU]/mL (ref 0.350–4.500)

## 2014-09-22 LAB — SEDIMENTATION RATE: SED RATE: 50 mm/h — AB (ref 0–22)

## 2014-09-22 LAB — I-STAT TROPONIN, ED: TROPONIN I, POC: 0 ng/mL (ref 0.00–0.08)

## 2014-09-22 MED ORDER — MORPHINE SULFATE 4 MG/ML IJ SOLN
4.0000 mg | Freq: Once | INTRAMUSCULAR | Status: AC
  Administered 2014-09-22: 4 mg via INTRAVENOUS
  Filled 2014-09-22: qty 1

## 2014-09-22 MED ORDER — ONDANSETRON HCL 4 MG PO TABS: 4.0000 mg | ORAL_TABLET | Freq: Four times a day (QID) | ORAL | Status: DC | PRN

## 2014-09-22 MED ORDER — ACETAMINOPHEN 325 MG PO TABS
650.0000 mg | ORAL_TABLET | Freq: Four times a day (QID) | ORAL | Status: DC | PRN
  Administered 2014-09-22 – 2014-09-25 (×4): 650 mg via ORAL
  Filled 2014-09-22 (×5): qty 2

## 2014-09-22 MED ORDER — HEPARIN SODIUM (PORCINE) 5000 UNIT/ML IJ SOLN
5000.0000 [IU] | Freq: Three times a day (TID) | INTRAMUSCULAR | Status: DC
  Administered 2014-09-22 – 2014-09-25 (×8): 5000 [IU] via SUBCUTANEOUS
  Filled 2014-09-22 (×8): qty 1

## 2014-09-22 MED ORDER — SODIUM CHLORIDE 0.9 % IV BOLUS (SEPSIS)
1000.0000 mL | Freq: Once | INTRAVENOUS | Status: AC
  Administered 2014-09-22: 1000 mL via INTRAVENOUS

## 2014-09-22 MED ORDER — SODIUM CHLORIDE 0.9 % IJ SOLN: 3.0000 mL | INTRAMUSCULAR | Status: DC | PRN

## 2014-09-22 MED ORDER — LORAZEPAM 2 MG/ML IJ SOLN: 1.0000 mg | Freq: Four times a day (QID) | INTRAMUSCULAR | Status: AC | PRN

## 2014-09-22 MED ORDER — LORAZEPAM 1 MG PO TABS: 1.0000 mg | ORAL_TABLET | Freq: Four times a day (QID) | ORAL | Status: AC | PRN

## 2014-09-22 MED ORDER — ACETAMINOPHEN 650 MG RE SUPP: 650.0000 mg | Freq: Four times a day (QID) | RECTAL | Status: DC | PRN

## 2014-09-22 MED ORDER — PANTOPRAZOLE SODIUM 40 MG PO TBEC
40.0000 mg | DELAYED_RELEASE_TABLET | Freq: Every day | ORAL | Status: DC
  Administered 2014-09-22 – 2014-09-26 (×5): 40 mg via ORAL
  Filled 2014-09-22 (×5): qty 1

## 2014-09-22 MED ORDER — ADULT MULTIVITAMIN W/MINERALS CH
1.0000 | ORAL_TABLET | Freq: Every day | ORAL | Status: DC
  Administered 2014-09-23 – 2014-09-26 (×2): 1 via ORAL
  Filled 2014-09-22 (×4): qty 1

## 2014-09-22 MED ORDER — OXYCODONE HCL 5 MG PO TABS
5.0000 mg | ORAL_TABLET | Freq: Four times a day (QID) | ORAL | Status: DC | PRN
  Administered 2014-09-22 – 2014-09-25 (×6): 5 mg via ORAL
  Filled 2014-09-22 (×6): qty 1

## 2014-09-22 MED ORDER — ALUM & MAG HYDROXIDE-SIMETH 200-200-20 MG/5ML PO SUSP: 30.0000 mL | Freq: Four times a day (QID) | ORAL | Status: DC | PRN

## 2014-09-22 MED ORDER — SODIUM CHLORIDE 0.9 % IV SOLN: INTRAVENOUS | Status: DC

## 2014-09-22 MED ORDER — ONDANSETRON HCL 4 MG/2ML IJ SOLN: 4.0000 mg | Freq: Four times a day (QID) | INTRAMUSCULAR | Status: DC | PRN

## 2014-09-22 MED ORDER — THIAMINE HCL 100 MG/ML IJ SOLN
100.0000 mg | Freq: Every day | INTRAMUSCULAR | Status: DC
  Filled 2014-09-22: qty 2

## 2014-09-22 MED ORDER — VITAMIN B-1 100 MG PO TABS
100.0000 mg | ORAL_TABLET | Freq: Every day | ORAL | Status: DC
  Administered 2014-09-23 – 2014-09-26 (×2): 100 mg via ORAL
  Filled 2014-09-22 (×4): qty 1

## 2014-09-22 MED ORDER — SODIUM CHLORIDE 0.9 % IJ SOLN
3.0000 mL | Freq: Two times a day (BID) | INTRAMUSCULAR | Status: DC
  Administered 2014-09-22 – 2014-09-25 (×7): 3 mL via INTRAVENOUS

## 2014-09-22 MED ORDER — IOHEXOL 350 MG/ML SOLN
100.0000 mL | Freq: Once | INTRAVENOUS | Status: AC | PRN
  Administered 2014-09-22: 100 mL via INTRAVENOUS

## 2014-09-22 MED ORDER — SODIUM CHLORIDE 0.9 % IV SOLN: 250.0000 mL | INTRAVENOUS | Status: DC | PRN

## 2014-09-22 MED ORDER — FOLIC ACID 1 MG PO TABS
1.0000 mg | ORAL_TABLET | Freq: Every day | ORAL | Status: DC
  Administered 2014-09-23 – 2014-09-26 (×2): 1 mg via ORAL
  Filled 2014-09-22 (×4): qty 1

## 2014-09-22 NOTE — H&P (Signed)
[facility] Admission History and Physical Service Pager: [phone]  Patient name: Angela Serrano Medical record number: [id number] Date of birth: 11-23-1960 Age: 54 y.o. Gender: female  Primary Care Provider: Conni Slipper, MD Consultants: none at present (plan for cardiology) Code Status: full code (discussed with pt upon admission)  Chief Complaint: difficulty swallowing, central chest & left flank pain  Assessment and Plan: Angela Serrano is a 54 y.o. female presenting with vague chest complaints of difficulty swallowing/globus sensation and left flank/rib pain, found to have pericardial effusion on imaging. PMH is significant for GERD, tobacco abuse, daily alcohol consumption.  # Pericardial effusion: Vital stable, no respiratory distress at present. Likely explains the chest discomfort and left rib/flank pain patient has been having the last few weeks. It's unclear to me if this is related to her globus sensation, or if that is a separate issue. She has multiple family members with autoimmune disorders, or who are being worked up at present for autoimmune disorders.  -Admit to telemetry unit for monitoring  -Check echo  -Cycle troponins, repeat EKG in morning  -Sedimentation rate 50. CRP pending. Check ANA. If ANA positive, will need to discuss additional rheumatologic workup. -Consult cardiology in the morning. Await their recommendations regarding possibility of pericardiocentesis, or additional evaluation that needs to be done. -Risk stratification with lipids, A1c, TSH  # Difficulty swallowing/globus sensation: Unclear this is related to her pericardial effusion. She's never seen a GI doctor before. May benefit from outpatient GI follow-up. -Protonix daily -Consider barium swallow to evaluate esophageal function  # Possible urinary difficulty: Patient did not mention this to me during our discussion. Noted by emergency room physician. Urinalysis not  consistent with infection. She did have moderate blood, but CT of the abdomen was negative for any kidney stones. -Monitor urine output  # Alcohol use: reports drinking two 12 ounce beers daily - CIWA protocol out of an abundance of caution in the event pt is underreporting alcohol intake  FEN/GI: Saline lock IV, heart healthy diet Prophylaxis: Subcutaneous heparin  Disposition: Admit to telemetry unit, dyspepsia pending further evaluation.  History of Present Illness: Angela Serrano is a 54 y.o. female presenting with several weeks of globus sensation, as well as mid chest and left flank discomfort. The symptoms seem somewhat vague. They've been going on for several weeks, and are not acutely worsened, but the patient decided she'd had enough, and decided to come to the emergency room today. She's noted sensation of feeling like her food is not completely going down her esophagus when she eats. She does better with liquids. Has also had some discomfort in her left side and mid chest for the last several days. No shortness of breath, abdominal pain, difficulty stooling, fevers.  She is our patient at the [facility], however she's not been seen since early 2014.  Review Of Systems: Per HPI, Otherwise 12 point review of systems was performed and was unremarkable.  Patient Active Problem List   Diagnosis Date Noted  . Chest pain 09/22/2014  . Elevated BP 01/11/2013  . Neck pain, bilateral 01/11/2013  . Poor sleep pattern 01/11/2013  . GERD (gastroesophageal reflux disease) 01/11/2013  . Seasonal allergies 01/11/2013  . Tobacco use 04/06/2012  . Health care maintenance 04/06/2012   Past Medical History: Past Medical History  Diagnosis Date  . Seasonal allergies 2011    sneezing and hoarseness  . Poor dentition   . Acid reflux disease    Past  Surgical History: Past Surgical History  Procedure Laterality Date  . Myomectomy  2001  . Tubal ligation  1990   Social  History: History  Substance Use Topics  . Smoking status: Current Every Day Smoker -- 0.25 packs/day for 30 years    Types: Cigarettes  . Smokeless tobacco: Never Used  . Alcohol Use: Yes     Comment: one beer per night for sleep   Additional social history: drinks two 12 oz beers daily  Please also refer to relevant sections of EMR.  Family History: Family History  Problem Relation Age of Onset  . Cancer Mother 63    Bone  . Alcohol abuse Father 40  . Hypertension Sister   . Hyperlipidemia Sister   . Hypertension Sister   . Thyroid disease Daughter   . Diabetes Maternal Aunt   . Cancer Maternal Aunt 60    Bone   . Diabetes Maternal Grandmother   . Cancer Maternal Aunt 63    Bone    Allergies and Medications: No Known Allergies No current facility-administered medications on file prior to encounter.   Current Outpatient Prescriptions on File Prior to Encounter  Medication Sig Dispense Refill  . HYDROcodone-acetaminophen (NORCO/VICODIN) 5-325 MG per tablet Take 2 tablets by mouth every 4 (four) hours as needed for moderate pain. (Patient not taking: Reported on 09/22/2014) 14 tablet 0  . pantoprazole (PROTONIX) 40 MG tablet Take 1 tablet (40 mg total) by mouth daily. Needs visit with PCP. (Patient not taking: Reported on 09/22/2014) 30 tablet 0  . traMADol (ULTRAM) 50 MG tablet Take 1 tablet (50 mg total) by mouth every 6 (six) hours as needed. (Patient not taking: Reported on 09/22/2014) 20 tablet 0    Objective: BP 131/72 mmHg  Pulse 102  Temp(Src) 98.8 F (37.1 C) (Oral)  Resp 16  SpO2 100%  LMP 04/27/2013 Exam: General: NAD, pleasant, cooperative HEENT: NCAT, MMM Cardiovascular: RRR, no murmurs auscultated Respiratory: NWOB. Coarse crackles at bilateral bases. Abdomen: soft, nontender to palpation. Ribs also nontender. No organomegaly. NABS. Extremities: No appreciable lower extremity edema bilaterally, atraumatic Skin: warm and dry Neuro: grossly nonfocal, speech  normal  Labs and Imaging: CBC BMET   Recent Labs Lab 09/22/14 1241  WBC 11.7*  HGB 12.1  HCT 36.7  PLT 443*    Recent Labs Lab 09/22/14 1241  NA 141  K 4.2  CL 106  CO2 28  BUN 11  CREATININE 0.57  GLUCOSE 101*  CALCIUM 9.0     CTA Chest: 1. Negative for acute PE or thoracic aortic dissection. 2. Moderate pericardial effusion and trace bilateral pleural effusions, new since prior study.  CT Abd Renal Stone: 1. Moderate pericardial effusion with slight atelectasis at both lung bases along with a small left pleural effusion. 2. Uterine fibroids. 3. No acute abnormalities of the abdomen.  CXR: 1. Mild cardiomegaly, new since prior exam.  EKG sinus tach, baseline wander, no acute ischemia  Angela Rio, MD 09/22/2014, 7:15 PM PGY-3, [facility] pager: [phone], text pages welcome

## 2014-09-22 NOTE — ED Notes (Signed)
Went in 21 to collect labs and patient was going to x-ray I will collect when they return

## 2014-09-22 NOTE — ED Provider Notes (Signed)
CSN: [id number]     Arrival date & time 09/22/14  1117 History   First MD Initiated Contact with Patient 09/22/14 1200     Chief Complaint  Patient presents with  . Flank Pain  . Gastrophageal Reflux     (Consider location/radiation/quality/duration/timing/severity/associated sxs/prior Treatment) HPI Comments: Patient presents with multiple complaints including one month of globus sensation that is been worse over the past week.  Several days of midsternal sharp chest pain which is worse with cough and deep breathing as well as a left-sided flank pain, dark urine and decreased urine output.  She denies fever, chills, cough, shortness of breath.  She is able to tolerate both liquids and solids, though feels like in her lower esophagus contents are getting stuck.   Past Medical History  Diagnosis Date  . Seasonal allergies 2011    sneezing and hoarseness  . Poor dentition   . Acid reflux disease    Past Surgical History  Procedure Laterality Date  . Myomectomy  2001  . Tubal ligation  1990   Family History  Problem Relation Age of Onset  . Cancer Mother 21    Bone  . Alcohol abuse Father 53  . Hypertension Sister   . Hyperlipidemia Sister   . Hypertension Sister   . Thyroid disease Daughter   . Diabetes Maternal Aunt   . Cancer Maternal Aunt 60    Bone   . Diabetes Maternal Grandmother   . Cancer Maternal Aunt 63    Bone    History  Substance Use Topics  . Smoking status: Current Every Day Smoker -- 0.25 packs/day for 30 years    Types: Cigarettes  . Smokeless tobacco: Never Used  . Alcohol Use: Yes     Comment: one beer per night for sleep   OB History    Gravida Para Term Preterm AB TAB SAB Ectopic Multiple Living   '3 2 2  1 1         '$ Review of Systems  Constitutional: Negative for fever, chills, diaphoresis, activity change, appetite change and fatigue.  HENT: Positive for trouble swallowing. Negative for congestion, facial swelling, rhinorrhea and sore  throat.   Eyes: Negative for photophobia and discharge.  Respiratory: Negative for cough, chest tightness and shortness of breath.   Cardiovascular: Positive for chest pain. Negative for palpitations and leg swelling.  Gastrointestinal: Negative for nausea, vomiting, abdominal pain and diarrhea.  Endocrine: Negative for polydipsia and polyuria.  Genitourinary: Positive for flank pain and difficulty urinating. Negative for dysuria, frequency and pelvic pain.  Musculoskeletal: Negative for back pain, arthralgias, neck pain and neck stiffness.  Skin: Negative for color change and wound.  Allergic/Immunologic: Negative for immunocompromised state.  Neurological: Negative for facial asymmetry, weakness, numbness and headaches.  Hematological: Does not bruise/bleed easily.  Psychiatric/Behavioral: Negative for confusion and agitation.      Allergies  Review of patient's allergies indicates no known allergies.  Home Medications   Prior to Admission medications   Medication Sig Start Date End Date Taking? Authorizing Provider  aspirin-acetaminophen-caffeine (EXCEDRIN MIGRAINE) [phone] MG per tablet Take 1 tablet by mouth every 6 (six) hours as needed for headache.   Yes Historical Provider, MD  calcium carbonate (TUMS - DOSED IN MG ELEMENTAL CALCIUM) 500 MG chewable tablet Chew 1 tablet by mouth 2 (two) times daily as needed for indigestion or heartburn.   Yes Historical Provider, MD  HYDROcodone-acetaminophen (NORCO/VICODIN) 5-325 MG per tablet Take 2 tablets by mouth every 4 (four) hours  as needed for moderate pain. Patient not taking: Reported on 09/22/2014 09/28/13   Fredia Sorrow, MD  pantoprazole (PROTONIX) 40 MG tablet Take 1 tablet (40 mg total) by mouth daily. Needs visit with PCP. Patient not taking: Reported on 09/22/2014    Hilton Sinclair, MD  traMADol (ULTRAM) 50 MG tablet Take 1 tablet (50 mg total) by mouth every 6 (six) hours as needed. Patient not taking: Reported on  09/22/2014 09/28/13   Fredia Sorrow, MD   BP 126/66 mmHg  Pulse 90  Temp(Src) 98.8 F (37.1 C) (Oral)  Resp 16  SpO2 99%  LMP 04/27/2013 Physical Exam  Constitutional: She is oriented to person, place, and time. She appears well-developed and well-nourished. No distress.  HENT:  Head: Normocephalic and atraumatic.  Mouth/Throat: No oropharyngeal exudate.  Eyes: Pupils are equal, round, and reactive to light.  Neck: Normal range of motion. Neck supple.  Cardiovascular: Regular rhythm and normal heart sounds.  Tachycardia present.  Exam reveals no gallop and no friction rub.   No murmur heard. Pulmonary/Chest: Effort normal and breath sounds normal. No respiratory distress. She has no wheezes. She has no rales.  Abdominal: Soft. Bowel sounds are normal. She exhibits no distension and no mass. There is no tenderness. There is no rebound and no guarding.  Musculoskeletal: Normal range of motion. She exhibits no edema or tenderness.  Neurological: She is alert and oriented to person, place, and time.  Skin: Skin is warm and dry.  Psychiatric: She has a normal mood and affect.    ED Course  Procedures (including critical care time) Labs Review Labs Reviewed  URINALYSIS, ROUTINE W REFLEX MICROSCOPIC - Abnormal; Notable for the following:    Color, Urine AMBER (*)    Hgb urine dipstick MODERATE (*)    Bilirubin Urine SMALL (*)    All other components within normal limits  CBC WITH DIFFERENTIAL - Abnormal; Notable for the following:    WBC 11.7 (*)    Platelets 443 (*)    Neutro Abs 8.9 (*)    Monocytes Absolute 1.1 (*)    All other components within normal limits  BASIC METABOLIC PANEL - Abnormal; Notable for the following:    Glucose, Bld 101 (*)    All other components within normal limits  D-DIMER, QUANTITATIVE - Abnormal; Notable for the following:    D-Dimer, Quant 3.00 (*)    All other components within normal limits  URINE MICROSCOPIC-ADD ON  BRAIN NATRIURETIC PEPTIDE   SEDIMENTATION RATE  I-STAT TROPOININ, ED    Imaging Review Dg Chest 2 View  09/22/2014   CLINICAL DATA:  Pt states flank pain with difficulty urinating. Pt symptoms started this week. No discharge. No fever known. Pain goes to left side. No hx of kidney stones. Also c/o discomfort with swallowing. Feels like food doesn't go down. Is able to swallow but keeps feeling of "fullness". Increase in belching. Hx of reflux.  EXAM: CHEST - 2 VIEW  COMPARISON:  09/27/2013  FINDINGS: Mild cardiomegaly, new since prior exam. Linear subsegmental atelectasis or scarring in the lung bases. Lungs otherwise clear. No pneumothorax. No effusion. Degenerative changes in the right shoulder.  IMPRESSION: 1. Mild cardiomegaly, new since prior exam.   Electronically Signed   By: Arne Cleveland M.D.   On: 09/22/2014 12:47   Ct Angio Chest Pe W/cm &/or Wo Cm  09/22/2014   CLINICAL DATA:  flank pain with difficulty urinating. Pt symptoms started this week. No discharge. No fever known. Pain  goes to left side. No hx of kidney stones. Also c/o discomfort with swallowing. Feels like food doesn't go down. Is able to swallow but keeps feeling of "fullness". Increase in belching. Hx of reflux. Not taking medications for it. "not like she should" Pt said left sided back ainElevated d dimer  EXAM: CT ANGIOGRAPHY CHEST WITH CONTRAST  TECHNIQUE: Multidetector CT imaging of the chest was performed using the standard protocol during bolus administration of intravenous contrast. Multiplanar CT image reconstructions and MIPs were obtained to evaluate the vascular anatomy.  CONTRAST:  138mL OMNIPAQUE IOHEXOL 350 MG/ML SOLN  COMPARISON:  09/27/2013  FINDINGS: SVC patent. RV/LV ratio less than 1. Satisfactory opacification of pulmonary arteries noted, and there is no evidence of pulmonary emboli. Patent superior and inferior pulmonary veins bilaterally. Adequate contrast opacification of the thoracic aorta with no evidence of dissection, aneurysm,  or stenosis. There is bovine variant brachiocephalic arch anatomy without proximal stenosis.  There is a moderate pericardial effusion , new since prior study. Trace bilateral pleural effusions. No hilar or mediastinal adenopathy. Moderate dependent atelectasis in both lower lobes left greater than right, and posteriorly in the left upper lobe. Thoracic spine and sternum intact. Visualized portions of upper abdomen unremarkable.  Review of the MIP images confirms the above findings.  IMPRESSION: 1. Negative for acute PE or thoracic aortic dissection. 2. Moderate pericardial effusion and trace bilateral pleural effusions, new since prior study.   Electronically Signed   By: Arne Cleveland M.D.   On: 09/22/2014 16:43   Ct Renal Stone Study  09/22/2014   CLINICAL DATA:  Left flank pain.  Difficulty urinating.  EXAM: CT ABDOMEN AND PELVIS WITHOUT CONTRAST  TECHNIQUE: Multidetector CT imaging of the abdomen and pelvis was performed following the standard protocol without IV contrast.  COMPARISON:  CT scan of the chest dated 09/27/2013  FINDINGS: There is a new a moderate pericardial effusion with a small left pleural effusion and slight bibasilar atelectasis. There is borderline cardiomegaly.  The liver appears normal. There is suggestion of sludge in the otherwise normal gallbladder. No dilated bile ducts. The spleen, pancreas, adrenal glands, and left kidney are normal. There is a 4.5 cm cyst on the lower pole the otherwise normal right kidney. There are no renal or ureteral or bladder calculi.  The bowel appears normal including the terminal ileum and appendix.  The uterus is enlarged, measuring 11 by 7.3 by 5.6 cm. The residual inhomogeneous consistent with fibroids. The ovaries are normal. Bladder is normal.  No significant osseous abnormality.  IMPRESSION: 1. Moderate pericardial effusion with slight atelectasis at both lung bases along with a small left pleural effusion. 2. Uterine fibroids. 3. No acute  abnormalities of the abdomen.   Electronically Signed   By: Rozetta Nunnery M.D.   On: 09/22/2014 14:44     EKG Interpretation   Date/Time:  Saturday September 22 2014 11:53:53 EST Ventricular Rate:  107 PR Interval:  148 QRS Duration: 68 QT Interval:  331 QTC Calculation: 442 R Axis:   49 Text Interpretation:  Sinus tachycardia Probable left atrial enlargement  Borderline T wave abnormalities Baseline wander in lead(s) V3 Confirmed by  [place] ([id number]) on 09/22/2014 2:57:22 PM      MDM   Final diagnoses:  Left flank pain  Positive D dimer  Pericardial effusion  Hematuria    Pt is a 54 y.o. female with Pmhx as above who presents with  1 month of globus sensation which is been  worse for the past week, now also with a pleuritic midsternal pressure, worse with cough and deep breathing as well as to 3 days of left flank pain with radiation to the left side of her abdomen with decreased urine output.  She denies shortness of breath or leg swelling.  On physical exam she is tachycardic, 93% on room air.  No alert should've any edema.  EKG with sinus tachycardia.  Troponin is not elevated.  D-dimer is elevated at 3.  Large blood seen on urine but negative for nitrites and leukocytes.  Chest x-ray with new mild cardiomegaly since prior exam.    CT stone study ordered, which showed a moderate pericardial effusion with a small left pleural effusion and atelectasis.  Spoke with cardiology, who recommend echo and medical admit.  CTA done given d-dimer of 3 and was negative for PE or dissection.  Pericardial effusion again seen.  ESR and CRP ordered.  Patient will be admitted to family medicine to [facility] for further workup      Ernestina Patches, MD 09/22/14 1726

## 2014-09-22 NOTE — ED Notes (Signed)
Pt states flank pain with difficulty urinating.  Pt symptoms started this week. No discharge.  No fever known.  Pain goes to left side.  No hx of kidney stones.  Also c/o discomfort with swallowing.  Feels like food doesn't go down.  Is able to swallow but keeps feeling of "fullness". Increase in belching.  Hx of reflux.  Not taking medications for it.  "not like she should"

## 2014-09-22 NOTE — ED Notes (Signed)
Feels like she has a fever inside her (only in evening).  Dark urine,

## 2014-09-22 NOTE — ED Notes (Signed)
Talked with Lab.  Ulice Dash is running the BNP.

## 2014-09-22 NOTE — ED Notes (Signed)
Pt not in room.

## 2014-09-23 DIAGNOSIS — R072 Precordial pain: Secondary | ICD-10-CM

## 2014-09-23 DIAGNOSIS — I369 Nonrheumatic tricuspid valve disorder, unspecified: Secondary | ICD-10-CM

## 2014-09-23 DIAGNOSIS — I319 Disease of pericardium, unspecified: Secondary | ICD-10-CM

## 2014-09-23 DIAGNOSIS — R079 Chest pain, unspecified: Secondary | ICD-10-CM

## 2014-09-23 LAB — LIPID PANEL
CHOL/HDL RATIO: 2.5 ratio
Cholesterol: 127 mg/dL (ref 0–200)
HDL: 50 mg/dL (ref >39)
LDL Cholesterol: 63 mg/dL (ref 0–99)
Triglycerides: 72 mg/dL (ref <150)
VLDL: 14 mg/dL (ref 0–40)

## 2014-09-23 LAB — C-REACTIVE PROTEIN: CRP: 13.1 mg/dL — AB (ref <0.60)

## 2014-09-23 LAB — HEPATIC FUNCTION PANEL
ALT: 8 U/L (ref 0–35)
AST: 13 U/L (ref 0–37)
Albumin: 2.7 g/dL — ABNORMAL LOW (ref 3.5–5.2)
Alkaline Phosphatase: 65 U/L (ref 39–117)
BILIRUBIN TOTAL: 0.3 mg/dL (ref 0.3–1.2)
Bilirubin, Direct: <0.1 mg/dL (ref 0.0–0.3)
TOTAL PROTEIN: 6 g/dL (ref 6.0–8.3)

## 2014-09-23 LAB — CBC
HEMATOCRIT: 31.5 % — AB (ref 36.0–46.0)
Hemoglobin: 10.5 g/dL — ABNORMAL LOW (ref 12.0–15.0)
MCH: 30.7 pg (ref 26.0–34.0)
MCHC: 33.3 g/dL (ref 30.0–36.0)
MCV: 92.1 fL (ref 78.0–100.0)
PLATELETS: 398 10*3/uL (ref 150–400)
RBC: 3.42 MIL/uL — ABNORMAL LOW (ref 3.87–5.11)
RDW: 12.5 % (ref 11.5–15.5)
WBC: 10 10*3/uL (ref 4.0–10.5)

## 2014-09-23 LAB — BASIC METABOLIC PANEL
Anion gap: 5 (ref 5–15)
BUN: 7 mg/dL (ref 6–23)
CALCIUM: 8 mg/dL — AB (ref 8.4–10.5)
CO2: 24 mmol/L (ref 19–32)
Chloride: 109 mEq/L (ref 96–112)
Creatinine, Ser: 0.53 mg/dL (ref 0.50–1.10)
GFR calc non Af Amer: >90 mL/min (ref >90)
Glucose, Bld: 94 mg/dL (ref 70–99)
Potassium: 3.7 mmol/L (ref 3.5–5.1)
Sodium: 138 mmol/L (ref 135–145)

## 2014-09-23 LAB — TROPONIN I: Troponin I: <0.03 ng/mL (ref <0.031)

## 2014-09-23 LAB — HEMOGLOBIN A1C
HEMOGLOBIN A1C: 5.7 % — AB (ref <5.7)
Mean Plasma Glucose: 117 mg/dL — ABNORMAL HIGH (ref <117)

## 2014-09-23 NOTE — Progress Notes (Signed)
  Echocardiogram 2D Echocardiogram has been performed.  Diamond Nickel 09/23/2014, 4:57 PM

## 2014-09-23 NOTE — Progress Notes (Signed)
Family Medicine Teaching Service Daily Progress Note Intern Pager: [phone]  Patient name: Angela Serrano Medical record number: [id number] Date of birth: 04/24/1961 Age: 54 y.o. Gender: female  Primary Care Provider: Conni Slipper, MD Consultants: cardiology (will call today) Code Status: full per discussion on admission  Pt Overview and Major Events to Date:  1/2 - admit with pericardial effusion, globus sensation  Assessment and Plan: [name] is a 54 y.o. female presenting with vague chest complaints of difficulty swallowing/globus sensation and left flank/rib pain, found to have pericardial effusion on imaging. PMH is significant for GERD, tobacco abuse, daily alcohol consumption.  # Pericardial effusion: Vital stable, no respiratory distress at present. Likely explains the chest discomfort and left rib/flank pain patient has been having the last Serrano weeks. It's unclear to me if this is related to her globus sensation, or if that is a separate issue. She has multiple family members with autoimmune disorders, or who are being worked up at present for autoimmune disorders.  -continue to monitor on telemetry -await echo, AM EKG -Cycle troponins, neg x2 thus far -Sedimentation rate 50. CRP & ANA pending. If ANA positive, will need to discuss additional rheumatologic workup. -Consult cardiology this morning. Await their recommendations regarding possibility of pericardiocentesis, or additional evaluation that needs to be done. -Risk stratification: lipids with ASCVD 10 year risk of 2.5% (no indication for statin). TSH nl. A1c pending  # Hypocalcemia: Ca 8.0 today.  - check hepatic function panel  # Difficulty swallowing/globus sensation: Unclear this is related to her pericardial effusion. She's never seen a GI doctor before. May benefit from outpatient GI follow-up. -Protonix daily -Consider barium swallow to evaluate esophageal function  # Possible urinary difficulty: Patient did  not mention this to me during our discussion. Noted by emergency room physician. Urinalysis not consistent with infection. She did have moderate blood, but CT of the abdomen was negative for any kidney stones. -Monitor urine output, intake & output ordered  # Alcohol use: reports drinking two 12 ounce beers daily - CIWA protocol out of an abundance of caution in the event pt is underreporting alcohol intake  FEN/GI: Saline lock IV, heart healthy diet Prophylaxis: Subcutaneous heparin  Disposition: pending echo & cardiology eval  Subjective:  Feeling well. Chest discomfort somewhat improved. Tolerating PO intake.  Objective: Temp:  [98.6 F (37 C)-99.5 F (37.5 C)] 99.5 F (37.5 C) (01/03 0620) Pulse Rate:  [90-119] 106 (01/03 0620) Resp:  [16-18] 16 (01/02 1805) BP: (120-144)/(66-83) 120/66 mmHg (01/03 0620) SpO2:  [93 %-100 %] 94 % (01/03 0620) Weight:  [115 lb 3.2 oz (52.254 kg)-118 lb 9.6 oz (53.797 kg)] 115 lb 3.2 oz (52.254 kg) (01/03 1478) Physical Exam: General: NAD, lying in bed comfortably Cardiovascular: RRR no murmurs Respiratory: fine crackles at bilat bases, otherwise clear, normal respiratory effort Abdomen: soft, NTTP Extremities: No appreciable lower extremity edema bilaterally   Laboratory:  Recent Labs Lab 09/22/14 1241 09/23/14 0038  WBC 11.7* 10.0  HGB 12.1 10.5*  HCT 36.7 31.5*  PLT 443* 398    Recent Labs Lab 09/22/14 1241 09/23/14 0038  NA 141 138  K 4.2 3.7  CL 106 109  CO2 28 24  BUN 11 7  CREATININE 0.57 0.53  CALCIUM 9.0 8.0*  GLUCOSE 101* 94    Imaging/Diagnostic Tests: CTA Chest: 1. Negative for acute PE or thoracic aortic dissection. 2. Moderate pericardial effusion and trace bilateral pleural effusions, new since prior study.  CT Abd Renal Stone: 1.  Moderate pericardial effusion with slight atelectasis at both lung bases along with a small left pleural effusion. 2. Uterine fibroids. 3. No acute abnormalities of the  abdomen.  CXR: 1. Mild cardiomegaly, new since prior exam.  Leeanne Rio, MD 09/23/2014, 7:12 AM PGY-3, [place] Intern pager: [phone], text pages welcome

## 2014-09-23 NOTE — Consult Note (Signed)
CARDIOLOGY CONSULT NOTE       Patient ID: Angela Serrano MRN: [id number] DOB/AGE: 02/28/1961 54 y.o.  Admit date: 09/22/2014 Referring Physician:  Randolm Idol Primary Physician: Simone Curia, MD Primary Cardiologist:  New Reason for Consultation:  Pericardial Effusion  Active Problems:   Chest pain   Left flank pain   Pericardial effusion   HPI:   54 yo smoker with no previous cardiac history.  Has been having pressure build up in chest.  Difficulty swallowing food feeling that things get stuck and not "progressing" No real chest pain or dyspnea Voice chronically bronchitic.  Also began having lower back pain and thought she might have UTI Came to ER to be evaluated  Thought she had low grade ferve  Drinks daily and smokes 1/2 ppd Not clear why CT ordered but no PE and "moderate "  Pericardial effusion noted  Multiple family members with autoimmune disease    ROS All other systems reviewed and negative except as noted above  Past Medical History  Diagnosis Date  . Seasonal allergies 2011    sneezing and hoarseness  . Poor dentition   . Acid reflux disease     Family History  Problem Relation Age of Onset  . Cancer Mother 62    Bone  . Alcohol abuse Father 53  . Hypertension Sister   . Hyperlipidemia Sister   . Hypertension Sister   . Thyroid disease Daughter   . Diabetes Maternal Aunt   . Cancer Maternal Aunt 60    Bone   . Diabetes Maternal Grandmother   . Cancer Maternal Aunt 46    Bone     History   Social History  . Marital Status: Single    Spouse Name: N/A    Number of Children: 3  . Years of Education: N/A   Occupational History  . Guest Services  [employer]    [employer]    Social History Main Topics  . Smoking status: Current Every Day Smoker -- 0.25 packs/day for 30 years    Types: Cigarettes  . Smokeless tobacco: Never Used  . Alcohol Use: Yes     Comment: one beer per night for sleep  . Drug Use: No  . Sexual Activity: No   Other  Topics Concern  . Not on file   Social History Narrative    Past Surgical History  Procedure Laterality Date  . Myomectomy  2001  . Tubal ligation  1990     . folic acid  1 mg Oral Daily  . heparin  5,000 Units Subcutaneous 3 times per day  . multivitamin with minerals  1 tablet Oral Daily  . pantoprazole  40 mg Oral Daily  . sodium chloride  3 mL Intravenous Q12H  . thiamine  100 mg Oral Daily   Or  . thiamine  100 mg Intravenous Daily      Physical Exam: Blood pressure 120/66, pulse 106, temperature 99.5 F (37.5 C), temperature source Oral, resp. rate 16, height 5\' 1"  (1.549 m), weight 52.254 kg (115 lb 3.2 oz), last menstrual period 04/27/2013, SpO2 94 %.    Affect appropriate Thin black female  HEENT: Bronchitic voice  Neck supple with no adenopathy JVP normal no bruits no thyromegaly Lungs clear with no wheezing and good diaphragmatic motion Heart:  S1/S2 no murmur, no rub, gallop or click PMI normal Abdomen: benighn, BS positve, no tenderness, no AAA no bruit.  No HSM or HJR Distal pulses intact with no  bruits No edema Neuro non-focal Skin warm and dry No muscular weakness   Labs:   Lab Results  Component Value Date   WBC 10.0 09/23/2014   HGB 10.5* 09/23/2014   HCT 31.5* 09/23/2014   MCV 92.1 09/23/2014   PLT 398 09/23/2014    Recent Labs Lab 09/23/14 0038 09/23/14 0809  NA 138  --   K 3.7  --   CL 109  --   CO2 24  --   BUN 7  --   CREATININE 0.53  --   CALCIUM 8.0*  --   PROT  --  6.0  BILITOT  --  0.3  ALKPHOS  --  65  ALT  --  8  AST  --  13  GLUCOSE 94  --    Lab Results  Component Value Date   TROPONINI <0.03 09/23/2014    Lab Results  Component Value Date   CHOL 127 09/23/2014   CHOL 171 01/11/2013   CHOL 174 04/06/2012   Lab Results  Component Value Date   HDL 50 09/23/2014   HDL 71 01/11/2013   HDL 65 04/06/2012   Lab Results  Component Value Date   LDLCALC 63 09/23/2014   LDLCALC 88 01/11/2013   LDLCALC 97  04/06/2012   Lab Results  Component Value Date   TRIG 72 09/23/2014   TRIG 62 01/11/2013   TRIG 58 04/06/2012   Lab Results  Component Value Date   CHOLHDL 2.5 09/23/2014   CHOLHDL 2.4 01/11/2013   CHOLHDL 2.7 04/06/2012   No results found for: LDLDIRECT    Radiology: Dg Chest 2 View  09/22/2014   CLINICAL DATA:  Pt states flank pain with difficulty urinating. Pt symptoms started this week. No discharge. No fever known. Pain goes to left side. No hx of kidney stones. Also c/o discomfort with swallowing. Feels like food doesn't go down. Is able to swallow but keeps feeling of "fullness". Increase in belching. Hx of reflux.  EXAM: CHEST - 2 VIEW  COMPARISON:  09/27/2013  FINDINGS: Mild cardiomegaly, new since prior exam. Linear subsegmental atelectasis or scarring in the lung bases. Lungs otherwise clear. No pneumothorax. No effusion. Degenerative changes in the right shoulder.  IMPRESSION: 1. Mild cardiomegaly, new since prior exam.   Electronically Signed   By: Oley Balm M.D.   On: 09/22/2014 12:47   Ct Angio Chest Pe W/cm &/or Wo Cm  09/22/2014   CLINICAL DATA:  flank pain with difficulty urinating. Pt symptoms started this week. No discharge. No fever known. Pain goes to left side. No hx of kidney stones. Also c/o discomfort with swallowing. Feels like food doesn't go down. Is able to swallow but keeps feeling of "fullness". Increase in belching. Hx of reflux. Not taking medications for it. "not like she should" Pt said left sided back ainElevated d dimer  EXAM: CT ANGIOGRAPHY CHEST WITH CONTRAST  TECHNIQUE: Multidetector CT imaging of the chest was performed using the standard protocol during bolus administration of intravenous contrast. Multiplanar CT image reconstructions and MIPs were obtained to evaluate the vascular anatomy.  CONTRAST:  OMNIPAQUE IOHEXOL 350 MG/ML SOLN  COMPARISON:  09/27/2013  FINDINGS: SVC patent. RV/LV ratio less than 1. Satisfactory opacification of  pulmonary arteries noted, and there is no evidence of pulmonary emboli. Patent superior and inferior pulmonary veins bilaterally. Adequate contrast opacification of the thoracic aorta with no evidence of dissection, aneurysm, or stenosis. There is bovine variant brachiocephalic arch anatomy without proximal stenosis.  There is a moderate pericardial effusion , new since prior study. Trace bilateral pleural effusions. No hilar or mediastinal adenopathy. Moderate dependent atelectasis in both lower lobes left greater than right, and posteriorly in the left upper lobe. Thoracic spine and sternum intact. Visualized portions of upper abdomen unremarkable.  Review of the MIP images confirms the above findings.  IMPRESSION: 1. Negative for acute PE or thoracic aortic dissection. 2. Moderate pericardial effusion and trace bilateral pleural effusions, new since prior study.   Electronically Signed   By: Arne Cleveland M.D.   On: 09/22/2014 16:43   Ct Renal Stone Study  09/22/2014   CLINICAL DATA:  Left flank pain.  Difficulty urinating.  EXAM: CT ABDOMEN AND PELVIS WITHOUT CONTRAST  TECHNIQUE: Multidetector CT imaging of the abdomen and pelvis was performed following the standard protocol without IV contrast.  COMPARISON:  CT scan of the chest dated 09/27/2013  FINDINGS: There is a new a moderate pericardial effusion with a small left pleural effusion and slight bibasilar atelectasis. There is borderline cardiomegaly.  The liver appears normal. There is suggestion of sludge in the otherwise normal gallbladder. No dilated bile ducts. The spleen, pancreas, adrenal glands, and left kidney are normal. There is a 4.5 cm cyst on the lower pole the otherwise normal right kidney. There are no renal or ureteral or bladder calculi.  The bowel appears normal including the terminal ileum and appendix.  The uterus is enlarged, measuring 11 by 7.3 by 5.6 cm. The residual inhomogeneous consistent with fibroids. The ovaries are normal.  Bladder is normal.  No significant osseous abnormality.  IMPRESSION: 1. Moderate pericardial effusion with slight atelectasis at both lung bases along with a small left pleural effusion. 2. Uterine fibroids. 3. No acute abnormalities of the abdomen.   Electronically Signed   By: Rozetta Nunnery M.D.   On: 09/22/2014 14:44    EKG:  SR rate 107 LAE no St changes no signs of pericarditis   ASSESSMENT AND PLAN:   Pericardial Effusion:  Not likely related to her symptoms.  Agree with echo evaluation as effusions always overestimated on CT.  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    Dysphagia:  Needs swallowing study and possible EGD  GI to see Radiology unable to do study until Monday    Smoking:  Discussed cessation minimal motivation to quit   Back Pain:  UA negative further w/u per primary service   Signed: Jenkins Rouge 09/23/2014, 11:08 AM

## 2014-09-24 ENCOUNTER — Inpatient Hospital Stay (HOSPITAL_COMMUNITY): Payer: Self-pay

## 2014-09-24 ENCOUNTER — Encounter (HOSPITAL_COMMUNITY): Payer: Self-pay | Admitting: Physician Assistant

## 2014-09-24 DIAGNOSIS — R0989 Other specified symptoms and signs involving the circulatory and respiratory systems: Secondary | ICD-10-CM | POA: Insufficient documentation

## 2014-09-24 DIAGNOSIS — R319 Hematuria, unspecified: Secondary | ICD-10-CM | POA: Insufficient documentation

## 2014-09-24 DIAGNOSIS — R09A2 Foreign body sensation, throat: Secondary | ICD-10-CM | POA: Insufficient documentation

## 2014-09-24 DIAGNOSIS — R071 Chest pain on breathing: Secondary | ICD-10-CM | POA: Insufficient documentation

## 2014-09-24 DIAGNOSIS — F458 Other somatoform disorders: Secondary | ICD-10-CM

## 2014-09-24 MED ORDER — IOHEXOL 300 MG/ML  SOLN
100.0000 mL | Freq: Once | INTRAMUSCULAR | Status: AC | PRN
  Administered 2014-09-24: 100 mL via INTRAVENOUS

## 2014-09-24 NOTE — Progress Notes (Signed)
Patient: Angela Serrano / Admit Date: 09/22/2014 / Date of Encounter: 09/24/2014, 8:18 AM   Subjective: Continues with abdominal pain and back discomfort. Tried to eat breakfast but again it felt like it was getting stuck. She endorses a mild chest discomfort worse when she breathes or twists in bed; not worse with palpation, sitting up or lying back.   Objective: Telemetry: Sinus tach low 100s Physical Exam: Blood pressure 137/68, pulse 101, temperature 100.2 F (37.9 C), temperature source Oral, resp. rate 18, height $RemoveBe'5\' 1"'iCtKHHbCT$  (1.549 m), weight 112 lb 9.6 oz (51.075 kg), last menstrual period 04/27/2013, SpO2 96 %. General: Well developed thin AAF in no acute distress. Head: Normocephalic, atraumatic, sclera non-icteric, no xanthomas, nares are without discharge. Neck: JVP not elevated. Lungs: Clear bilaterally to auscultation without wheezes, rales, or rhonchi. Breathing is unlabored. Heart: RRR borderline elevated rate S1 S2 without murmurs, rubs, or gallops.  Abdomen: Soft, non-tender, non-distended with normoactive bowel sounds. No rebound/guarding. Extremities: No clubbing or cyanosis. No edema. Distal pedal pulses are 2+ and equal bilaterally. Neuro: Alert and oriented X 3. Moves all extremities spontaneously. Psych:  Responds to questions appropriately with a normal affect.   Intake/Output Summary (Last 24 hours) at 09/24/14 0818 Last data filed at 09/23/14 2100  Gross per 24 hour  Intake    223 ml  Output      0 ml  Net    223 ml    Inpatient Medications:  . folic acid  1 mg Oral Daily  . heparin  5,000 Units Subcutaneous 3 times per day  . multivitamin with minerals  1 tablet Oral Daily  . pantoprazole  40 mg Oral Daily  . sodium chloride  3 mL Intravenous Q12H  . thiamine  100 mg Oral Daily   Or  . thiamine  100 mg Intravenous Daily   Infusions:    Labs:  Recent Labs  09/22/14 1241 09/23/14 0038  NA 141 138  K 4.2 3.7  CL 106 109  CO2 28 24  GLUCOSE 101* 94  BUN  11 7  CREATININE 0.57 0.53  CALCIUM 9.0 8.0*    Recent Labs  09/23/14 0809  AST 13  ALT 8  ALKPHOS 65  BILITOT 0.3  PROT 6.0  ALBUMIN 2.7*    Recent Labs  09/22/14 1241 09/23/14 0038  WBC 11.7* 10.0  NEUTROABS 8.9*  --   HGB 12.1 10.5*  HCT 36.7 31.5*  MCV 91.8 92.1  PLT 443* 398    Recent Labs  09/22/14 2000 09/23/14 0038 09/23/14 0710  TROPONINI <0.03 <0.03 <0.03   Invalid input(s): POCBNP  Recent Labs  09/22/14 2000  HGBA1C 5.7*     Radiology/Studies:  Dg Chest 2 View  09/22/2014   CLINICAL DATA:  Pt states flank pain with difficulty urinating. Pt symptoms started this week. No discharge. No fever known. Pain goes to left side. No hx of kidney stones. Also c/o discomfort with swallowing. Feels like food doesn't go down. Is able to swallow but keeps feeling of "fullness". Increase in belching. Hx of reflux.  EXAM: CHEST - 2 VIEW  COMPARISON:  09/27/2013  FINDINGS: Mild cardiomegaly, new since prior exam. Linear subsegmental atelectasis or scarring in the lung bases. Lungs otherwise clear. No pneumothorax. No effusion. Degenerative changes in the right shoulder.  IMPRESSION: 1. Mild cardiomegaly, new since prior exam.   Electronically Signed   By: Arne Cleveland M.D.   On: 09/22/2014 12:47   Ct Angio Chest Pe W/cm &/or  Wo Cm  09/22/2014   CLINICAL DATA:  flank pain with difficulty urinating. Pt symptoms started this week. No discharge. No fever known. Pain goes to left side. No hx of kidney stones. Also c/o discomfort with swallowing. Feels like food doesn't go down. Is able to swallow but keeps feeling of "fullness". Increase in belching. Hx of reflux. Not taking medications for it. "not like she should" Pt said left sided back ainElevated d dimer  EXAM: CT ANGIOGRAPHY CHEST WITH CONTRAST  TECHNIQUE: Multidetector CT imaging of the chest was performed using the standard protocol during bolus administration of intravenous contrast. Multiplanar CT image reconstructions  and MIPs were obtained to evaluate the vascular anatomy.  CONTRAST:  OMNIPAQUE IOHEXOL 350 MG/ML SOLN  COMPARISON:  09/27/2013  FINDINGS: SVC patent. RV/LV ratio less than 1. Satisfactory opacification of pulmonary arteries noted, and there is no evidence of pulmonary emboli. Patent superior and inferior pulmonary veins bilaterally. Adequate contrast opacification of the thoracic aorta with no evidence of dissection, aneurysm, or stenosis. There is bovine variant brachiocephalic arch anatomy without proximal stenosis.  There is a moderate pericardial effusion , new since prior study. Trace bilateral pleural effusions. No hilar or mediastinal adenopathy. Moderate dependent atelectasis in both lower lobes left greater than right, and posteriorly in the left upper lobe. Thoracic spine and sternum intact. Visualized portions of upper abdomen unremarkable.  Review of the MIP images confirms the above findings.  IMPRESSION: 1. Negative for acute PE or thoracic aortic dissection. 2. Moderate pericardial effusion and trace bilateral pleural effusions, new since prior study.   Electronically Signed   By: Oley Balm M.D.   On: 09/22/2014 16:43   Ct Renal Stone Study  09/22/2014   CLINICAL DATA:  Left flank pain.  Difficulty urinating.  EXAM: CT ABDOMEN AND PELVIS WITHOUT CONTRAST  TECHNIQUE: Multidetector CT imaging of the abdomen and pelvis was performed following the standard protocol without IV contrast.  COMPARISON:  CT scan of the chest dated 09/27/2013  FINDINGS: There is a new a moderate pericardial effusion with a small left pleural effusion and slight bibasilar atelectasis. There is borderline cardiomegaly.  The liver appears normal. There is suggestion of sludge in the otherwise normal gallbladder. No dilated bile ducts. The spleen, pancreas, adrenal glands, and left kidney are normal. There is a 4.5 cm cyst on the lower pole the otherwise normal right kidney. There are no renal or ureteral or bladder  calculi.  The bowel appears normal including the terminal ileum and appendix.  The uterus is enlarged, measuring 11 by 7.3 by 5.6 cm. The residual inhomogeneous consistent with fibroids. The ovaries are normal. Bladder is normal.  No significant osseous abnormality.  IMPRESSION: 1. Moderate pericardial effusion with slight atelectasis at both lung bases along with a small left pleural effusion. 2. Uterine fibroids. 3. No acute abnormalities of the abdomen.   Electronically Signed   By: Geanie Cooley M.D.   On: 09/22/2014 14:44     Assessment and Plan  1. Dysphagia, weight loss, fever 2. Pericardial effusion - moderate by CT (usually overestimated), trivial by 2D Echo 09/23/14 3. Tobacco abuse, counseled regarding cessation 4. ? Autoimmune process with elevated d-dimer, ESR, CRP (family hx of autommune dz) 5. Normocytic anemia  Pericardial effusion was overestimated by CT - was trivial by 2D echo. Chest pain is somewhat atypical for pericarditis. She has not had any exertional CP or SOB and troponins neg x4. It is not clear that the cardiac findings were acutely  related to her presentation. Will discuss further workup with MD. Further eval of dysphagia, weight loss, fever, possible autoimmune process per IM.  Signed, Melina Copa PA-C As above, patient seen and examined. Patient has some chest pain that increases with inspiration and certain movements. Abdominal CT suggested moderate pericardial effusion. However echocardiogram shows trivial effusion by report. No intervention indicated at this point. Agree with continued evaluation by primary care for weight loss and dysphagia. Autoimmune disease felt to be in the differential given elevated sedimentation rate. Would repeat echo in six - eight weeks (moderate effusion by CT) and if no change in size, no further cardiac WU. We will sign off. Please call with questions. Kirk Ruths

## 2014-09-24 NOTE — Progress Notes (Signed)
Pt with oral temp of 100.8 and 100.2 this am. MD on call made aware. No new orders. Day shift to cont to monitor.

## 2014-09-24 NOTE — Consult Note (Signed)
[facility] Consult: 9:14 AM 09/25/2014  LOS: 3 days    Referring Provider: Erin Hearing.   Primary Care Physician:  Conni Slipper, MD Primary Gastroenterologist:  Althia Forts.    Reason for Consultation:  Globus and esophageal stricture.   HPI: Angela Serrano is a 54 y.o. female.  S/p 2004 myomectomy for uterine fibroids.    Admitted with vague, non-exertional chest pain, dysphagia in upper neck region for last 2 weeks. CT angio chest shows pericardial effusion and trace pleural effusions.  Trivial pericardial effusion and EF 50-55% on 2D echo.  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 .  CT scan neck entirely unremarkable, no findings correlating with the esophagram.  Prior to 2 weeks ago the patient never had reflux symptoms nor issues with her swallowing.  She describes difficulty swallowing solids, it feels like they're getting stuck in the upper region of her neck. Once she belches the food will pass. She has not had to regurgitate or vomited back up the food. She moves her bowels about twice a week, which has been her long-term Mariea Clonts. She has not had any nausea or vomiting. Within the last few days her weight has dropped about 2 pounds due to her not taking much in the way of by mouth's but generally, though thin, her weight is stable.  Drinks 12 oz beer daily.  Takes Excedrin every other night.  No narcotics.   Still having fevers to 101.3 late last night.      Past Medical History  Diagnosis Date  . Seasonal allergies 2011    sneezing and hoarseness  . Poor dentition   . Acid reflux disease     Past Surgical History  Procedure Laterality Date  . Myomectomy  2004  . Tubal ligation  1990    Prior to Admission medications     Medication Sig Start Date End Date Taking? Authorizing Provider  aspirin-acetaminophen-caffeine (EXCEDRIN MIGRAINE) [id number] MG per tablet Take 1 tablet by mouth every 6 (six) hours as needed for headache.   Yes Historical Provider, MD  calcium carbonate (TUMS - DOSED IN MG ELEMENTAL CALCIUM) 500 MG chewable tablet Chew 1 tablet by mouth 2 (two) times daily as needed for indigestion or heartburn.   Yes Historical Provider, MD  Flexeril      Scheduled Meds: . folic acid  1 mg Oral Daily  . heparin  5,000 Units Subcutaneous 3 times per day  . multivitamin with minerals  1 tablet Oral Daily  . pantoprazole  40 mg Oral Daily  . sodium chloride  3 mL Intravenous Q12H  . thiamine  100 mg Oral Daily   Or  . thiamine  100 mg Intravenous Daily   Infusions:   PRN Meds: sodium chloride, acetaminophen **OR** acetaminophen, alum & mag hydroxide-simeth, LORazepam **OR** LORazepam, ondansetron **OR** ondansetron (ZOFRAN) IV, oxyCODONE, sodium chloride   Allergies as of 09/22/2014  . (No Known Allergies)    Family History  Problem Relation Age of Onset  . Cancer Mother 71  Bone  . Alcohol abuse Father 11  . Hypertension Sister   . Hyperlipidemia Sister   . Hypertension Sister   . Thyroid disease Daughter   . Diabetes Maternal Aunt   . Cancer Maternal Aunt 60    Bone   . Diabetes Maternal Grandmother   . Cancer Maternal Aunt 63    Bone     History   Social History  . Marital Status: Single    Spouse Name: N/A    Number of Children: 3  . Years of Education: N/A   Occupational History  . [employer]    Social History Main Topics  . Smoking status: Current Every Day Smoker -- 0.25 packs/day for 30 years    Types: Cigarettes  . Smokeless tobacco: Never Used  . Alcohol Use: Yes     Comment: one beer per night for sleep  . Drug Use: No  . Sexual Activity: No   Other Topics Concern  . Not on file   Social History Narrative     REVIEW OF SYSTEMS: Constitutional:  Patient has always been thin. Within the last several days, because of not eating, she has dropped about 2 pounds she thinks. ENT:  No nose bleeds.  No congestion Pulm:  No shortness of breath. Occasional, mostly nonproductive, cough. CV:  Occasional runs of tachycardia., no LE edema.  GU:  No hematuria, no frequency GI:  Per HPI Heme:  No issues with excessive bleeding or excessive tendency to bruise.   Transfusions:  None Neuro:  No recent headaches, no peripheral tingling or numbness Derm:  No itching, no rash or sores.  Endocrine:  No sweats or chills.  No polyuria or dysuria Immunization:  Not queried Travel:  None beyond local counties in last few months.    PHYSICAL EXAM: Vital signs in last 24 hours: Filed Vitals:   09/25/14 0600  BP: 134/69  Pulse: 98  Temp: 99.4.  tmax 101.3  Resp: 16   Wt Readings from Last 3 Encounters:  09/25/14 113 lb 1.6 oz (51.302 kg)  01/11/13 125 lb (56.7 kg)  04/06/12 128 lb (58.06 kg)    General: Pleasant, thin but not cachectic appearing, comfortable AAF.   Head:  No facial swelling, asymmetry or signs of head trauma.  Eyes:  No conjunctival pallor, no scleral icterus Ears:  Not hard of hearing  Nose:  No congestion or discharge. Mouth:  Several missing teeth. Oral mucosa is moist and clear. Tongue midline Neck:  No masses, no TMG, no bruits.  Not tender Lungs:  CTA bil.  No cough, no dyspnea. Vocal quality hoarse, consistent with smoking Heart: Rhythm is slightly tachy but regular. There is a 2/6 diastolic murmur. Abdomen:  Soft, nondistended, nontender. Bowel sounds active. No mass. No HSM. No bruits..   Rectal:  Deferred   Musc/Skeltl:  No joint swelling, redness or deformities. No contractures Extremities:  No CCE.  Neurologic:  Oriented 3. Very alert. Good historian. No tremor, no limb weakness or asymmetric strength. Skin:  No rash, no telangiectasia, no sores. Tattoos:  None Nodes:   No cervical or inguinal adenopathy.   Psych:  Pleasant, in good spirits, cooperative.  Intake/Output from previous day:   Intake/Output this shift:    LAB RESULTS:  Recent Labs  09/22/14 1241 09/23/14 0038 09/25/14 0406  WBC 11.7* 10.0 10.5  HGB 12.1 10.5* 11.1*  HCT 36.7 31.5* 33.7*  PLT 443* 398 524*   BMET  Lab Results  Component Value Date   NA 137 09/25/2014   NA 138 09/23/2014   NA 141 09/22/2014   K 3.4* 09/25/2014   K 3.7 09/23/2014   K 4.2 09/22/2014   CL 102 09/25/2014   CL 109 09/23/2014   CL 106 09/22/2014   CO2 24 09/25/2014   CO2 24 09/23/2014   CO2 28 09/22/2014   GLUCOSE 72 09/25/2014   GLUCOSE 94 09/23/2014   GLUCOSE 101* 09/22/2014   BUN <5* 09/25/2014   BUN 7 09/23/2014   BUN 11 09/22/2014   CREATININE 0.69 09/25/2014   CREATININE 0.53 09/23/2014   CREATININE 0.57 09/22/2014   CALCIUM 8.4 09/25/2014   CALCIUM 8.0* 09/23/2014   CALCIUM 9.0 09/22/2014   LFT  Recent Labs  09/23/14 0809  PROT 6.0  ALBUMIN 2.7*  AST 13  ALT 8  ALKPHOS 65  BILITOT 0.3  BILIDIR <0.1  IBILI NOT CALCULATED   PT/INR No results found for: INR, PROTIME  RADIOLOGY STUDIES: Ct Soft Tissue Neck W Contrast  09/24/2014   CLINICAL DATA:  Sensation of something being stuck in the throat. Chest pain during swallowing. Contrast swallow study showed a mass in the oropharynx at the level of the tongue base.  EXAM: CT NECK WITH CONTRAST  TECHNIQUE: Multidetector CT imaging of the neck was performed using the standard protocol following the bolus administration of intravenous contrast.  CONTRAST:  1104mL OMNIPAQUE IOHEXOL 300 MG/ML  SOLN  COMPARISON:  Swallowing study same day.  FINDINGS: Pharynx and larynx: No mucosal or submucosal lesion is seen. No evidence of tongue base mass.  Salivary glands: Both parotid glands are normal. Both submandibular glands are normal.  Thyroid: Symmetric and normal  Lymph nodes: No enlarged lymph nodes on either side of the neck.  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 Mild cervical spondylosis.  Upper chest: Layering pleural effusion on the left with dependent volume loss.  IMPRESSION: There is no evidence of mucosal or submucosal mass of the tongue base. No finding to correlate with the swallowing study.   Electronically Signed   By: Nelson Chimes M.D.   On: 09/24/2014 17:34   Dg Esophagus  09/24/2014   CLINICAL DATA:  Chronic GE reflux, recent globus sensation in the back of the throat, recent mid chest pain during swallows, recent sensation of solid food getting stuck in the throat.  EXAM: ESOPHOGRAM / BARIUM SWALLOW / BARIUM TABLET STUDY  TECHNIQUE: Combined double contrast and single contrast examination was performed using effervescent crystals, thick barium liquid, and thin barium liquid. The patient was observed with fluoroscopy swallowing a 13 mm barium sulfate tablet.  FLUOROSCOPY TIME:  2 min 24 sec, pulsed fluoroscopy.  COMPARISON:  None.  FINDINGS: Patient swallowed the thick and thin barium liquid without difficulty. Primary esophageal peristalsis is well-preserved, with only slight, insignificant break up of the primary wave in the upper esophagus on a single swallow, without break up of the primary wave on subsequent swallows. No fixed esophageal strictures or masses. No evidence of hiatal hernia. Gastroesophageal reflux was not elicited with use of the water siphon maneuver.  Evaluation of the pharynx during rapid sequence imaging with swallows of thin barium liquid demonstrate a mass at the level of the base of the tongue which causes a filling defect in the swallowed barium. There is also evidence of laryngeal penetration to the level of  the cords, though there is no evidence of frank tracheal aspiration.  IMPRESSION: 1. Mass in the oropharynx at the level of the base of the tongue. CT  of the neck with contrast is recommended in further evaluation to confirm or deny this finding. 2. Laryngeal penetration of thin barium liquid to the level of the vocal cords, without evidence of frank tracheal aspiration. 3. No significant abnormality involving the esophagus.   Electronically Signed   By: Evangeline Dakin M.D.   On: 09/24/2014 14:59    ENDOSCOPIC STUDIES: None ever  IMPRESSION:   *  Dysphagia to solids, new.  No hx GERD.  Esophagram with oropharyngeal mass, not seen on CT of neck.     *  Pericardial effusion.    *  Fevers.      PLAN:     *  Will need EGD.  Will place orders in depot.  Latest SQ heparin given at 0600 today.  Need to give 8 hours before proceeding to EGD.  Also consumed small amount of grits this AM.   Azucena Freed  09/25/2014, 9:14 AM Pager: [phone]

## 2014-09-24 NOTE — Progress Notes (Signed)
UR Completed Maris Abascal Graves-Bigelow, RN,BSN [phone]  

## 2014-09-24 NOTE — Discharge Summary (Signed)
[facility] Discharge Summary  Patient name: Angela Serrano Medical record number: [id number] Date of birth: 1961-03-15 Age: 54 y.o. Gender: female Date of Admission: 09/22/2014  Date of Discharge: 09/26/2014 Admitting Physician: Lupita Dawn, MD  Primary Care Provider: Conni Slipper, MD Consultants: GI  Indication for Hospitalization: finding of pericardial effusion on CT  Discharge Diagnoses/Problem List:  Trivial pericardial effusion Fever  Hypocalcemia Globus sensation Urinary difficulty (resolved) Alcohol use  Disposition: home  Discharge Condition: stable  Discharge Exam:  Filed Vitals:   09/26/14 0410  BP: 102/57  Pulse: 102  Temp: 99.2 F (37.3 C)  Resp: 18   General: NAD, sitting in chair comfortably Cardiovascular: RRR no murmurs Respiratory: CTAB, normal respiratory effort Abdomen: soft, NTND Extremities: No appreciable lower extremity edema bilaterally   Brief Hospital Course: Angela Serrano is a 54 y.o. female presenting with vague chest complaints of difficulty swallowing/globus sensation and left flank/rib pain, found to have pericardial effusion on imaging. PMH is significant for GERD, tobacco abuse, daily alcohol consumption.  # Pericardial effusion: Noted to have moderate pericardial effusion on CT on admission.  Vitals stable, no respiratory distress. Echo showed only trivial effusion. Troponins neg x3. Initially started rheumatologic work-up for concern of possible rheum etiology of pericardial effusion.  ESR 50, CRP 13.1& ANA negative.  Risk stratification labs showed  lipids with ASCVD 10 year risk of 2.5% (no indication for statin), TSH nl, A1c 5.7.  Cardiology was consulted on admission, but signed off after pericardial effusion found to be trivial.  # Febrile: Noted to be febrile on multiple evenings during admission.  No signs/symptoms of infection.  No findings consistent with cellulitis on physical exam.  CXR clear. UA non  infectious.  No stigmata of rheumatologic conditions.  May require additional outpatient follow-up if fevers continue.  # Dysphagia: Patient reporting difficulty swallowing from admission.  Barium swallow with apparent mass at base of tongue, not confirmed on CT.  GI consulted and performed EGD with empiric dilation.  Symptoms improved thereafter. Will continue protonix as outpatient.  # Possible urinary difficulty: Reported to ED physician but later denied. Urinalysis not consistent with infection. She did have moderate blood, but CT of the abdomen was negative for any kidney stones. Resolved prior to discharge.  All other chronic medical conditions were stable throughout admission and managed with home regimens.  Issues for Follow Up: 1. Consider repeat UA at follow-up appointment.  Noted to have asymptomatic hematuria during admission 2. Consider further work-up of fever if continues to spike - must consider malignancy, rheum, infection. 3. Cancer screenings: breast, colon, cervical 4. F/u HIV test - could be reason for fevers   Significant Procedures: EGD (1/5) - see below  Significant Labs and Imaging:   Recent Labs Lab 09/22/14 1241 09/23/14 0038 09/25/14 0406  WBC 11.7* 10.0 10.5  HGB 12.1 10.5* 11.1*  HCT 36.7 31.5* 33.7*  PLT 443* 398 524*    Recent Labs Lab 09/22/14 1241 09/23/14 0038 09/23/14 0809 09/25/14 0406 09/26/14 0417  NA 141 138  --  137 138  K 4.2 3.7  --  3.4* 3.5  CL 106 109  --  102 103  CO2 28 24  --  24 25  GLUCOSE 101* 94  --  72 107*  BUN 11 7  --  <5* 8  CREATININE 0.57 0.53  --  0.69 0.65  CALCIUM 9.0 8.0*  --  8.4 8.5  ALKPHOS  --   --  65  --   --   AST  --   --  13  --   --   ALT  --   --  8  --   --   ALBUMIN  --   --  2.7*  --   --     ESR 50 CRP 13.1 ANA negative  Barium swallow (1/6): Normal swallowing function  EGD (1/5): 1. The mucosa of the esophagus appeared normal; dilated using a 41mm (45Fr) savary dilator over  guidewire 2. Erosive gastritis in the gastric antrum 3. The EGD was otherwise appeared normal  Barium swallow (1/4): 1. Mass in the oropharynx at the level of the base of the tongue. CT of the neck with contrast is recommended in further evaluation to confirm or deny this finding. 2. Laryngeal penetration of thin barium liquid to the level of the vocal cords, without evidence of frank tracheal aspiration. 3. No significant abnormality involving the esophagus.  CT neck (1/4): There is no evidence of mucosal or submucosal mass of the tongue base. No finding to correlate with the swallowing study.  CTA Chest: 1. Negative for acute PE or thoracic aortic dissection. 2. Moderate pericardial effusion and trace bilateral pleural effusions, new since prior study.  CT Abd Renal Stone: 1. Moderate pericardial effusion with slight atelectasis at both lung bases along with a small left pleural effusion. 2. Uterine fibroids. 3. No acute abnormalities of the abdomen.  CXR: 1. Mild cardiomegaly, new since prior exam.  Results/Tests Pending at Time of Discharge: HIV  Discharge Medications:    Medication List    STOP taking these medications        HYDROcodone-acetaminophen 5-325 MG per tablet  Commonly known as:  NORCO/VICODIN      TAKE these medications        aspirin-acetaminophen-caffeine 250-250-65 MG per tablet  Commonly known as:  EXCEDRIN MIGRAINE  Take 1 tablet by mouth every 6 (six) hours as needed for headache.     calcium carbonate 500 MG chewable tablet  Commonly known as:  TUMS - dosed in mg elemental calcium  Chew 1 tablet by mouth 2 (two) times daily as needed for indigestion or heartburn.     pantoprazole 40 MG tablet  Commonly known as:  PROTONIX  Take 1 tablet (40 mg total) by mouth daily. Needs visit with PCP.     traMADol 50 MG tablet  Commonly known as:  ULTRAM  Take 1 tablet (50 mg total) by mouth every 6 (six) hours as needed.        Discharge  Instructions: Please refer to Patient Instructions section of EMR for full details.  Patient was counseled important signs and symptoms that should prompt return to medical care, changes in medications, dietary instructions, activity restrictions, and follow up appointments.   Follow-Up Appointments: Follow-up Information    Follow up with Conni Slipper, MD.   Specialty:  Family Medicine   Why:  Appointment made for hospital follow-up on 10/03/14 at [facility] information:   [place] [address] [phone]       Lavon Paganini, MD 09/26/2014, 1:05 PM PGY-1, [place]

## 2014-09-24 NOTE — Progress Notes (Signed)
Family Medicine Teaching Service Daily Progress Note Intern Pager: [phone]  Patient name: Angela Serrano Medical record number: [id number] Date of birth: Dec 12, 1960 Age: 54 y.o. Gender: female  Primary Care Provider: Conni Slipper, MD Consultants: cardiology Code Status: full per discussion on admission  Pt Overview and Major Events to Date:  1/2 - admit with pericardial effusion, globus sensation  Assessment and Plan: Angela Serrano is a 54 y.o. female presenting with vague chest complaints of difficulty swallowing/globus sensation and left flank/rib pain, found to have pericardial effusion on imaging. PMH is significant for GERD, tobacco abuse, daily alcohol consumption.  # Pericardial effusion: Vital stable, no respiratory distress at present. Likely explains the chest discomfort and left rib/flank pain patient has been having the last few weeks. It's unclear to me if this is related to her globus sensation, or if that is a separate issue. She has multiple family members with autoimmune disorders, or who are being worked up at present for autoimmune disorders.  -continue to monitor on telemetry - Echo (1/3) - EF 50-55%, normal Wall motion, trivial pericardial effusion  - Troponins neg x3 -ESR 50, CRP 13.1& ANA pending. If ANA positive, will need to discuss additional rheumatologic workup. - Cardiology consulting - appreciate recs -Risk stratification: lipids with ASCVD 10 year risk of 2.5% (no indication for statin). TSH nl. A1c 5.7  # Hypocalcemia: Ca 8.0 today.  - Hepatic function panel wnl  # Difficulty swallowing/globus sensation: Unclear this is related to her pericardial effusion. She's never seen a GI doctor before. May benefit from outpatient GI follow-up. - Protonix daily - Barium swallow to evaluate esophageal function with possible GI consult  # Possible urinary difficulty: Resolved. Urinalysis not consistent with infection. She did have moderate blood, but CT of the  abdomen was negative for any kidney stones. -Monitor urine output, intake & output ordered  # Alcohol use: reports drinking two 12 ounce beers daily - CIWA protocol out of an abundance of caution in the event pt is underreporting alcohol intake  FEN/GI: Saline lock IV, heart healthy diet Prophylaxis: Subcutaneous heparin  Disposition: pending echo & cardiology eval  Subjective:  Feeling well. Chest discomfort improved. Tolerating PO intake but still reporting globus sensation.   Objective: Temp:  [98.8 F (37.1 C)-100.8 F (38.2 C)] 100.8 F (38.2 C) (01/04 9242) Pulse Rate:  [76-101] 101 (01/04 0613) Resp:  [14-18] 18 (01/04 0613) BP: (137-157)/(68-80) 137/68 mmHg (01/04 0613) SpO2:  [93 %-96 %] 96 % (01/04 0613) Weight:  [112 lb 9.6 oz (51.075 kg)] 112 lb 9.6 oz (51.075 kg) (01/04 6834) Physical Exam: General: NAD, lying in bed comfortably Cardiovascular: RRR no murmurs Respiratory: CTAB, normal respiratory effort Abdomen: soft, NTTP Extremities: No appreciable lower extremity edema bilaterally   Laboratory:  Recent Labs Lab 09/22/14 1241 09/23/14 0038  WBC 11.7* 10.0  HGB 12.1 10.5*  HCT 36.7 31.5*  PLT 443* 398    Recent Labs Lab 09/22/14 1241 09/23/14 0038 09/23/14 0809  NA 141 138  --   K 4.2 3.7  --   CL 106 109  --   CO2 28 24  --   BUN 11 7  --   CREATININE 0.57 0.53  --   CALCIUM 9.0 8.0*  --   PROT  --   --  6.0  BILITOT  --   --  0.3  ALKPHOS  --   --  65  ALT  --   --  8  AST  --   --  13  GLUCOSE 101* 94  --     Imaging/Diagnostic Tests: CTA Chest: 1. Negative for acute PE or thoracic aortic dissection. 2. Moderate pericardial effusion and trace bilateral pleural effusions, new since prior study.  CT Abd Renal Stone: 1. Moderate pericardial effusion with slight atelectasis at both lung bases along with a small left pleural effusion. 2. Uterine fibroids. 3. No acute abnormalities of the abdomen.  CXR: 1. Mild cardiomegaly, new  since prior exam.  Lavon Paganini, MD 09/24/2014, 7:28 AM PGY-1, [place] Intern pager: [phone], text pages welcome

## 2014-09-25 ENCOUNTER — Inpatient Hospital Stay (HOSPITAL_COMMUNITY): Payer: Self-pay

## 2014-09-25 ENCOUNTER — Encounter (HOSPITAL_COMMUNITY): Payer: Self-pay | Admitting: *Deleted

## 2014-09-25 ENCOUNTER — Encounter (HOSPITAL_COMMUNITY)
Admission: EM | Disposition: A | Discharge status: Home / Self Care | Payer: 59 | Source: Home / Self Care | Attending: Emergency Medicine

## 2014-09-25 DIAGNOSIS — R509 Fever, unspecified: Secondary | ICD-10-CM | POA: Insufficient documentation

## 2014-09-25 DIAGNOSIS — R933 Abnormal findings on diagnostic imaging of other parts of digestive tract: Secondary | ICD-10-CM | POA: Insufficient documentation

## 2014-09-25 DIAGNOSIS — R1310 Dysphagia, unspecified: Secondary | ICD-10-CM

## 2014-09-25 HISTORY — PX: SAVORY DILATION: SHX5439

## 2014-09-25 HISTORY — PX: ESOPHAGOGASTRODUODENOSCOPY: SHX5428

## 2014-09-25 LAB — URINE MICROSCOPIC-ADD ON

## 2014-09-25 LAB — CBC
HEMATOCRIT: 33.7 % — AB (ref 36.0–46.0)
Hemoglobin: 11.1 g/dL — ABNORMAL LOW (ref 12.0–15.0)
MCH: 29.1 pg (ref 26.0–34.0)
MCHC: 32.9 g/dL (ref 30.0–36.0)
MCV: 88.5 fL (ref 78.0–100.0)
Platelets: 524 10*3/uL — ABNORMAL HIGH (ref 150–400)
RBC: 3.81 MIL/uL — ABNORMAL LOW (ref 3.87–5.11)
RDW: 12.1 % (ref 11.5–15.5)
WBC: 10.5 10*3/uL (ref 4.0–10.5)

## 2014-09-25 LAB — URINALYSIS, ROUTINE W REFLEX MICROSCOPIC
Glucose, UA: NEGATIVE mg/dL
Nitrite: NEGATIVE
PH: 6 (ref 5.0–8.0)
Protein, ur: NEGATIVE mg/dL
Specific Gravity, Urine: 1.02 (ref 1.005–1.030)
UROBILINOGEN UA: 2 mg/dL — AB (ref 0.0–1.0)

## 2014-09-25 LAB — BASIC METABOLIC PANEL
ANION GAP: 11 (ref 5–15)
BUN: <5 mg/dL — ABNORMAL LOW (ref 6–23)
CALCIUM: 8.4 mg/dL (ref 8.4–10.5)
CO2: 24 mmol/L (ref 19–32)
Chloride: 102 mEq/L (ref 96–112)
Creatinine, Ser: 0.69 mg/dL (ref 0.50–1.10)
GFR calc Af Amer: >90 mL/min (ref >90)
Glucose, Bld: 72 mg/dL (ref 70–99)
POTASSIUM: 3.4 mmol/L — AB (ref 3.5–5.1)
Sodium: 137 mmol/L (ref 135–145)

## 2014-09-25 LAB — ANA: Anti Nuclear Antibody(ANA): NEGATIVE

## 2014-09-25 LAB — HIV ANTIBODY: HIV: NONREACTIVE

## 2014-09-25 SURGERY — EGD (ESOPHAGOGASTRODUODENOSCOPY)
Anesthesia: Moderate Sedation

## 2014-09-25 MED ORDER — MIDAZOLAM HCL 5 MG/ML IJ SOLN
INTRAMUSCULAR | Status: AC
  Filled 2014-09-25: qty 2

## 2014-09-25 MED ORDER — DIPHENHYDRAMINE HCL 50 MG/ML IJ SOLN
INTRAMUSCULAR | Status: DC | PRN
  Administered 2014-09-25: 25 mg via INTRAVENOUS

## 2014-09-25 MED ORDER — MIDAZOLAM HCL 10 MG/2ML IJ SOLN
INTRAMUSCULAR | Status: DC | PRN
Start: 2014-09-25 — End: 2014-09-25
  Administered 2014-09-25: 2 mg via INTRAVENOUS
  Administered 2014-09-25 (×3): 1 mg via INTRAVENOUS

## 2014-09-25 MED ORDER — SODIUM CHLORIDE 0.9 % IV SOLN
INTRAVENOUS | Status: DC
  Administered 2014-09-25: 500 mL via INTRAVENOUS

## 2014-09-25 MED ORDER — BUTAMBEN-TETRACAINE-BENZOCAINE 2-2-14 % EX AERO
INHALATION_SPRAY | CUTANEOUS | Status: DC | PRN
Start: 2014-09-25 — End: 2014-09-25
  Administered 2014-09-25: 2 via TOPICAL

## 2014-09-25 MED ORDER — FENTANYL CITRATE 0.05 MG/ML IJ SOLN
INTRAMUSCULAR | Status: DC | PRN
  Administered 2014-09-25 (×2): 25 ug via INTRAVENOUS

## 2014-09-25 MED ORDER — FENTANYL CITRATE 0.05 MG/ML IJ SOLN
INTRAMUSCULAR | Status: AC
  Filled 2014-09-25: qty 2

## 2014-09-25 MED ORDER — DIPHENHYDRAMINE HCL 50 MG/ML IJ SOLN
INTRAMUSCULAR | Status: AC
  Filled 2014-09-25: qty 1

## 2014-09-25 NOTE — Discharge Instructions (Signed)
You were admitted after finding fluid around your heart on a CT scan.  This is a trivial amount of fluid.  You underwent a scope for the trouble swallowing which showed gastritis(see below).  Dysphagia Swallowing problems (dysphagia) occur when solids and liquids seem to stick in your throat on the way down to your stomach, or the food takes longer to get to the stomach. Other symptoms include regurgitating food, noises coming from the throat, chest discomfort with swallowing, and a feeling of fullness or the feeling of something being stuck in your throat when swallowing. When blockage in your throat is complete, it may be associated with drooling. CAUSES  Problems with swallowing may occur because of problems with the muscles. The food cannot be propelled in the usual manner into your stomach. You may have ulcers, scar tissue, or inflammation in the tube down which food travels from your mouth to your stomach (esophagus), which blocks food from passing normally into the stomach. Causes of inflammation include:  Acid reflux from your stomach into your esophagus.  Infection.  Radiation treatment for cancer.  Medicines taken without enough fluids to wash them down into your stomach. You may have nerve problems that prevent signals from being sent to the muscles of your esophagus to contract and move your food down to your stomach. Globus pharyngeus is a relatively common problem in which there is a sense of an obstruction or difficulty in swallowing, without any physical abnormalities of the swallowing passages being found. This problem usually improves over time with reassurance and testing to rule out other causes. DIAGNOSIS Dysphagia can be diagnosed and its cause can be determined by tests in which you swallow a white substance that helps illuminate the inside of your throat (contrast medium) while X-rays are taken. Sometimes a flexible telescope that is inserted down your throat (endoscopy) to  look at your esophagus and stomach is used. TREATMENT   If the dysphagia is caused by acid reflux or infection, medicines may be used.  If the dysphagia is caused by problems with your swallowing muscles, swallowing therapy may be used to help you strengthen your swallowing muscles.  If the dysphagia is caused by a blockage or mass, procedures to remove the blockage may be done. HOME CARE INSTRUCTIONS  Try to eat soft food that is easier to swallow and check your weight on a daily basis to be sure that it is not decreasing.  Be sure to drink liquids when sitting upright (not lying down). SEEK MEDICAL CARE IF:  You are losing weight because you are unable to swallow.  You are coughing when you drink liquids (aspiration).  You are coughing up partially digested food. SEEK IMMEDIATE MEDICAL CARE IF:  You are unable to swallow your own saliva .  You are having shortness of breath or a fever, or both.  You have a hoarse voice along with difficulty swallowing. MAKE SURE YOU:  Understand these instructions.  Will watch your condition.  Will get help right away if you are not doing well or get worse. Document Released: 09/04/2000 Document Revised: 01/22/2014 Document Reviewed: 02/24/2013 [facility] Patient Information 2015 [place], [place]. This information is not intended to replace advice given to you by your health care provider. Make sure you discuss any questions you have with your health care provider.

## 2014-09-25 NOTE — H&P (View-Only) (Signed)
[facility] Consult: 9:14 AM 09/25/2014  LOS: 3 days    Referring Provider: Erin Hearing.   Primary Care Physician:  Conni Slipper, MD Primary Gastroenterologist:  Althia Forts.    Reason for Consultation:  Globus and esophageal stricture.   HPI: Angela Serrano is a 54 y.o. female.  S/p 2004 myomectomy for uterine fibroids.    Admitted with vague, non-exertional chest pain, dysphagia in upper neck region for last 2 weeks. CT angio chest shows pericardial effusion and trace pleural effusions.  Trivial pericardial effusion and EF 50-55% on 2D echo.  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 .  CT scan neck entirely unremarkable, no findings correlating with the esophagram.  Prior to 2 weeks ago the patient never had reflux symptoms nor issues with her swallowing.  She describes difficulty swallowing solids, it feels like they're getting stuck in the upper region of her neck. Once she belches the food will pass. She has not had to regurgitate or vomited back up the food. She moves her bowels about twice a week, which has been her long-term Mariea Clonts. She has not had any nausea or vomiting. Within the last few days her weight has dropped about 2 pounds due to her not taking much in the way of by mouth's but generally, though thin, her weight is stable.  Drinks 12 oz beer daily.  Takes Excedrin every other night.  No narcotics.   Still having fevers to 101.3 late last night.      Past Medical History  Diagnosis Date  . Seasonal allergies 2011    sneezing and hoarseness  . Poor dentition   . Acid reflux disease     Past Surgical History  Procedure Laterality Date  . Myomectomy  2004  . Tubal ligation  1990    Prior to Admission medications     Medication Sig Start Date End Date Taking? Authorizing Provider  aspirin-acetaminophen-caffeine (EXCEDRIN MIGRAINE) [phone] MG per tablet Take 1 tablet by mouth every 6 (six) hours as needed for headache.   Yes Historical Provider, MD  calcium carbonate (TUMS - DOSED IN MG ELEMENTAL CALCIUM) 500 MG chewable tablet Chew 1 tablet by mouth 2 (two) times daily as needed for indigestion or heartburn.   Yes Historical Provider, MD  Flexeril      Scheduled Meds: . folic acid  1 mg Oral Daily  . heparin  5,000 Units Subcutaneous 3 times per day  . multivitamin with minerals  1 tablet Oral Daily  . pantoprazole  40 mg Oral Daily  . sodium chloride  3 mL Intravenous Q12H  . thiamine  100 mg Oral Daily   Or  . thiamine  100 mg Intravenous Daily   Infusions:   PRN Meds: sodium chloride, acetaminophen **OR** acetaminophen, alum & mag hydroxide-simeth, LORazepam **OR** LORazepam, ondansetron **OR** ondansetron (ZOFRAN) IV, oxyCODONE, sodium chloride   Allergies as of 09/22/2014  . (No Known Allergies)    Family History  Problem Relation Age of Onset  . Cancer Mother 53  Bone  . Alcohol abuse Father 25  . Hypertension Sister   . Hyperlipidemia Sister   . Hypertension Sister   . Thyroid disease Daughter   . Diabetes Maternal Aunt   . Cancer Maternal Aunt 60    Bone   . Diabetes Maternal Grandmother   . Cancer Maternal Aunt 63    Bone     History   Social History  . Marital Status: Single    Spouse Name: N/A    Number of Children: 3  . Years of Education: N/A   Occupational History  . [employer]    Social History Main Topics  . Smoking status: Current Every Day Smoker -- 0.25 packs/day for 30 years    Types: Cigarettes  . Smokeless tobacco: Never Used  . Alcohol Use: Yes     Comment: one beer per night for sleep  . Drug Use: No  . Sexual Activity: No   Other Topics Concern  . Not on file   Social History Narrative     REVIEW OF SYSTEMS: Constitutional:  Patient has always been thin. Within the last several days, because of not eating, she has dropped about 2 pounds she thinks. ENT:  No nose bleeds.  No congestion Pulm:  No shortness of breath. Occasional, mostly nonproductive, cough. CV:  Occasional runs of tachycardia., no LE edema.  GU:  No hematuria, no frequency GI:  Per HPI Heme:  No issues with excessive bleeding or excessive tendency to bruise.   Transfusions:  None Neuro:  No recent headaches, no peripheral tingling or numbness Derm:  No itching, no rash or sores.  Endocrine:  No sweats or chills.  No polyuria or dysuria Immunization:  Not queried Travel:  None beyond local counties in last few months.    PHYSICAL EXAM: Vital signs in last 24 hours: Filed Vitals:   09/25/14 0600  BP: 134/69  Pulse: 98  Temp: 99.4.  tmax 101.3  Resp: 16   Wt Readings from Last 3 Encounters:  09/25/14 113 lb 1.6 oz (51.302 kg)  01/11/13 125 lb (56.7 kg)  04/06/12 128 lb (58.06 kg)    General: Pleasant, thin but not cachectic appearing, comfortable AAF.   Head:  No facial swelling, asymmetry or signs of head trauma.  Eyes:  No conjunctival pallor, no scleral icterus Ears:  Not hard of hearing  Nose:  No congestion or discharge. Mouth:  Several missing teeth. Oral mucosa is moist and clear. Tongue midline Neck:  No masses, no TMG, no bruits.  Not tender Lungs:  CTA bil.  No cough, no dyspnea. Vocal quality hoarse, consistent with smoking Heart: Rhythm is slightly tachy but regular. There is a 2/6 diastolic murmur. Abdomen:  Soft, nondistended, nontender. Bowel sounds active. No mass. No HSM. No bruits..   Rectal:  Deferred   Musc/Skeltl:  No joint swelling, redness or deformities. No contractures Extremities:  No CCE.  Neurologic:  Oriented 3. Very alert. Good historian. No tremor, no limb weakness or asymmetric strength. Skin:  No rash, no telangiectasia, no sores. Tattoos:  None Nodes:   No cervical or inguinal adenopathy.   Psych:  Pleasant, in good spirits, cooperative.  Intake/Output from previous day:   Intake/Output this shift:    LAB RESULTS:  Recent Labs  09/22/14 1241 09/23/14 0038 09/25/14 0406  WBC 11.7* 10.0 10.5  HGB 12.1 10.5* 11.1*  HCT 36.7 31.5* 33.7*  PLT 443* 398 524*   BMET  Lab Results  Component Value Date   NA 137 09/25/2014   NA 138 09/23/2014   NA 141 09/22/2014   K 3.4* 09/25/2014   K 3.7 09/23/2014   K 4.2 09/22/2014   CL 102 09/25/2014   CL 109 09/23/2014   CL 106 09/22/2014   CO2 24 09/25/2014   CO2 24 09/23/2014   CO2 28 09/22/2014   GLUCOSE 72 09/25/2014   GLUCOSE 94 09/23/2014   GLUCOSE 101* 09/22/2014   BUN <5* 09/25/2014   BUN 7 09/23/2014   BUN 11 09/22/2014   CREATININE 0.69 09/25/2014   CREATININE 0.53 09/23/2014   CREATININE 0.57 09/22/2014   CALCIUM 8.4 09/25/2014   CALCIUM 8.0* 09/23/2014   CALCIUM 9.0 09/22/2014   LFT  Recent Labs  09/23/14 0809  PROT 6.0  ALBUMIN 2.7*  AST 13  ALT 8  ALKPHOS 65  BILITOT 0.3  BILIDIR <0.1  IBILI NOT CALCULATED   PT/INR No results found for: INR, PROTIME  RADIOLOGY STUDIES: Ct Soft Tissue Neck W Contrast  09/24/2014   CLINICAL DATA:  Sensation of something being stuck in the throat. Chest pain during swallowing. Contrast swallow study showed a mass in the oropharynx at the level of the tongue base.  EXAM: CT NECK WITH CONTRAST  TECHNIQUE: Multidetector CT imaging of the neck was performed using the standard protocol following the bolus administration of intravenous contrast.  CONTRAST:  117mL OMNIPAQUE IOHEXOL 300 MG/ML  SOLN  COMPARISON:  Swallowing study same day.  FINDINGS: Pharynx and larynx: No mucosal or submucosal lesion is seen. No evidence of tongue base mass.  Salivary glands: Both parotid glands are normal. Both submandibular glands are normal.  Thyroid: Symmetric and normal  Lymph nodes: No enlarged lymph nodes on either side of the neck.  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 Mild cervical spondylosis.  Upper chest: Layering pleural effusion on the left with dependent volume loss.  IMPRESSION: There is no evidence of mucosal or submucosal mass of the tongue base. No finding to correlate with the swallowing study.   Electronically Signed   By: Nelson Chimes M.D.   On: 09/24/2014 17:34   Dg Esophagus  09/24/2014   CLINICAL DATA:  Chronic GE reflux, recent globus sensation in the back of the throat, recent mid chest pain during swallows, recent sensation of solid food getting stuck in the throat.  EXAM: ESOPHOGRAM / BARIUM SWALLOW / BARIUM TABLET STUDY  TECHNIQUE: Combined double contrast and single contrast examination was performed using effervescent crystals, thick barium liquid, and thin barium liquid. The patient was observed with fluoroscopy swallowing a 13 mm barium sulfate tablet.  FLUOROSCOPY TIME:  2 min 24 sec, pulsed fluoroscopy.  COMPARISON:  None.  FINDINGS: Patient swallowed the thick and thin barium liquid without difficulty. Primary esophageal peristalsis is well-preserved, with only slight, insignificant break up of the primary wave in the upper esophagus on a single swallow, without break up of the primary wave on subsequent swallows. No fixed esophageal strictures or masses. No evidence of hiatal hernia. Gastroesophageal reflux was not elicited with use of the water siphon maneuver.  Evaluation of the pharynx during rapid sequence imaging with swallows of thin barium liquid demonstrate a mass at the level of the base of the tongue which causes a filling defect in the swallowed barium. There is also evidence of laryngeal penetration to the level of  the cords, though there is no evidence of frank tracheal aspiration.  IMPRESSION: 1. Mass in the oropharynx at the level of the base of the tongue. CT  of the neck with contrast is recommended in further evaluation to confirm or deny this finding. 2. Laryngeal penetration of thin barium liquid to the level of the vocal cords, without evidence of frank tracheal aspiration. 3. No significant abnormality involving the esophagus.   Electronically Signed   By: Evangeline Dakin M.D.   On: 09/24/2014 14:59    ENDOSCOPIC STUDIES: None ever  IMPRESSION:   *  Dysphagia to solids, new.  No hx GERD.  Esophagram with oropharyngeal mass, not seen on CT of neck.     *  Pericardial effusion.    *  Fevers.      PLAN:     *  Will need EGD.  Will place orders in depot.  Latest SQ heparin given at 0600 today.  Need to give 8 hours before proceeding to EGD.  Also consumed small amount of grits this AM.   Azucena Freed  09/25/2014, 9:14 AM Pager: [phone]

## 2014-09-25 NOTE — Progress Notes (Signed)
Evaluated Angela Serrano following report of fever. Fever noted in chart from 1533 on 1/4 to present time. Highest temperature noted to be 101.3, however it has been trending down and was 100.6 at last check. No acetaminophen given. States she has had a fever since yesterday afternoon (1/4). Denies feeling poorly. Denies shortness of breath or pain with urination. States she has developed a cough since admission. Coughs when she takes a "good breath." Has been avoiding breathing deeply to prevent cough.  Lung exam was clear bilaterally without wheezes, rales, or rhonchi. Abdominal exam showed soft and nondistended abdomen; bowel sounds were noted and palpation did not reveal any signs of tenderness.  Offered further evaluation of cough with fever, however Mrs. Roell prefers to wait until later in the morning for evaluation. Will sign out to day team for further workup. Will continue to monitor.  Dr. Gerlean Ren PGY1

## 2014-09-25 NOTE — Interval H&P Note (Signed)
History and Physical Interval Note:  09/25/2014 2:18 PM  Angela Serrano  has presented today for surgery, with the diagnosis of Dysphagia  The various methods of treatment have been discussed with the patient and family. After consideration of risks, benefits and other options for treatment, the patient has consented to  Procedure(s): ESOPHAGOGASTRODUODENOSCOPY (EGD) (N/A) SAVORY DILATION (N/A) as a surgical intervention .  The patient's history has been reviewed, patient examined, no change in status, stable for surgery.  I have reviewed the patient's chart and labs.  Questions were answered to the patient's satisfaction.     Pricilla Riffle. Fuller Plan MD

## 2014-09-25 NOTE — Progress Notes (Signed)
Family Medicine Teaching Service Daily Progress Note Intern Pager: [phone]  Patient name: JAZZLENE HUOT Medical record number: [id number] Date of birth: 09-Nov-1960 Age: 54 y.o. Gender: female  Primary Care Provider: Conni Slipper, MD Consultants: cardiology Code Status: full per discussion on admission  Pt Overview and Major Events to Date:  1/2 - admit with pericardial effusion, globus sensation  Assessment and Plan:  Jesusita L Desantiago is a 54 y.o. female presenting with vague chest complaints of difficulty swallowing/globus sensation and left flank/rib pain, found to have pericardial effusion on imaging. PMH is significant for GERD, tobacco abuse, daily alcohol consumption.  # Pericardial effusion: Vital stable, no respiratory distress. Trivial on Echo. - continue to monitor on telemetry - Echo (1/3) - EF 50-55%, normal Wall motion, trivial pericardial effusion  - Troponins neg x3 -ESR 50, CRP 13.1& ANA pending. If ANA positive, will need to discuss additional rheumatologic workup. - Cardiology consulting - appreciate recs - Risk stratification: lipids with ASCVD 10 year risk of 2.5% (no indication for statin). TSH nl. A1c 5.7  # Febrile: Tmax 101.3. Last fever 100.6 1/5 @ 0030. - Tylenol prn for fever - CXR, UA to evaluate for potential source - Consider BCx if spikes fever again - Monitor fever curve  # Hypocalcemia: Ca 8.0 today.  - Hepatic function panel wnl  # Difficulty swallowing/globus sensation: Barium swallow with apparent mass at base of tongue, not confirmed on CT. - GI consulted - appreciate recs - patient made NPO for possible EGD today - Protonix daily  # Possible urinary difficulty: Resolved. Urinalysis not consistent with infection. She did have moderate blood, but CT of the abdomen was negative for any kidney stones. -Monitor urine output, intake & output ordered  # Alcohol use: reports drinking two 12 ounce beers daily - CIWA protocol out of an abundance of  caution in the event pt is underreporting alcohol intake  FEN/GI: Saline lock IV, NPO Prophylaxis: Subcutaneous heparin  Disposition: pending echo & cardiology eval  Subjective:  Feeling well. Chest discomfort improved. Tolerating PO intake but still reporting globus sensation.  Denies SOB, dysuria. Does endorse cough and urinary frequency.  Objective: Temp:  [99.4 F (37.4 C)-101.3 F (38.5 C)] 99.4 F (37.4 C) (01/05 0515) Pulse Rate:  [95-106] 98 (01/05 0600) Resp:  [16] 16 (01/05 0515) BP: (119-134)/(69-83) 134/69 mmHg (01/05 0600) SpO2:  [96 %-100 %] 96 % (01/05 0515) Weight:  [113 lb 1.6 oz (51.302 kg)] 113 lb 1.6 oz (51.302 kg) (01/05 0515) Physical Exam: General: NAD, lying in bed comfortably Cardiovascular: RRR no murmurs Respiratory: CTAB, normal respiratory effort Abdomen: soft, NTTP Extremities: No appreciable lower extremity edema bilaterally   Laboratory:  Recent Labs Lab 09/22/14 1241 09/23/14 0038 09/25/14 0406  WBC 11.7* 10.0 10.5  HGB 12.1 10.5* 11.1*  HCT 36.7 31.5* 33.7*  PLT 443* 398 524*    Recent Labs Lab 09/22/14 1241 09/23/14 0038 09/23/14 0809 09/25/14 0406  NA 141 138  --  137  K 4.2 3.7  --  3.4*  CL 106 109  --  102  CO2 28 24  --  24  BUN 11 7  --  <5*  CREATININE 0.57 0.53  --  0.69  CALCIUM 9.0 8.0*  --  8.4  PROT  --   --  6.0  --   BILITOT  --   --  0.3  --   ALKPHOS  --   --  65  --   ALT  --   --  8  --   AST  --   --  13  --   GLUCOSE 101* 94  --  72    Imaging/Diagnostic Tests: Barium swallow (1/4): 1. Mass in the oropharynx at the level of the base of the tongue. CT of the neck with contrast is recommended in further evaluation to confirm or deny this finding. 2. Laryngeal penetration of thin barium liquid to the level of the vocal cords, without evidence of frank tracheal aspiration. 3. No significant abnormality involving the esophagus.  CT neck (1/4): There is no evidence of mucosal or submucosal mass of  the tongue base. No finding to correlate with the swallowing study.  CTA Chest: 1. Negative for acute PE or thoracic aortic dissection. 2. Moderate pericardial effusion and trace bilateral pleural effusions, new since prior study.  CT Abd Renal Stone: 1. Moderate pericardial effusion with slight atelectasis at both lung bases along with a small left pleural effusion. 2. Uterine fibroids. 3. No acute abnormalities of the abdomen.  CXR: 1. Mild cardiomegaly, new since prior exam.  Lavon Paganini, MD 09/25/2014, 8:36 AM PGY-1, [place] Intern pager: [phone], text pages welcome

## 2014-09-25 NOTE — Op Note (Signed)
[facility] [place], [id number]   ENDOSCOPY PROCEDURE REPORT  PATIENT: Angela, Serrano  MR#: [id number] BIRTHDATE: August 28, 1961 , 53  yrs. old GENDER: female ENDOSCOPIST: Ladene Artist, MD, [facility] Regal REFERRED BY:  Dalbert Mayotte, M.D. PROCEDURE DATE:  09/25/2014 PROCEDURE:  EGD w/ wire guided (savary) dilation ASA CLASS:     Class III INDICATIONS:  dysphagia and abnormal barium esophagogram. MEDICATIONS: Benadryl 25 mg IV, Fentanyl 50 mcg IV, and Versed 5 mg IV TOPICAL ANESTHETIC: Cetacaine Spray DESCRIPTION OF PROCEDURE: After the risks benefits and alternatives of the procedure were thoroughly explained, informed consent was obtained.  The Pentax Gastroscope [id number] endoscope was introduced through the mouth and advanced to the second portion of the duodenum , Without limitations.  The instrument was slowly withdrawn as the mucosa was fully examined.  ESOPHAGUS: The mucosa of the esophagus appeared normal.  No stricture noted. The esophagus was emprically dilated using a 2mm (45Fr) savary dilator over guidewire. STOMACH: Mild erosive gastritis (inflammation) was found in the gastric antrum.   The stomach otherwise appeared normal. DUODENUM: The duodenal mucosa showed no abnormalities in the bulb and 2nd part of the duodenum.  Retroflexed views revealed no abnormalities.     The scope was then withdrawn from the patient and the procedure completed.  COMPLICATIONS: There were no immediate complications.  ENDOSCOPIC IMPRESSION: 1.   The mucosa of the esophagus appeared normal; dilated using a 59mm (45Fr) savary dilator over guidewire 2.   Erosive gastritis in the gastric antrum 3.   The EGD was otherwise appeared normal  RECOMMENDATIONS: 1.  Anti-reflux regimen 2.  Continue PPI 3.  Post dilation instructions 4.  No cause for dysphagia found. Schedule Modified Barium Swallow Study.   eSigned:  Ladene Artist, MD, [facility] 09/25/2014 2:41  PM

## 2014-09-26 ENCOUNTER — Telehealth: Payer: Self-pay

## 2014-09-26 ENCOUNTER — Observation Stay (HOSPITAL_COMMUNITY): Payer: 59

## 2014-09-26 ENCOUNTER — Encounter (HOSPITAL_COMMUNITY): Payer: Self-pay | Admitting: Gastroenterology

## 2014-09-26 LAB — BASIC METABOLIC PANEL
Anion gap: 10 (ref 5–15)
BUN: 8 mg/dL (ref 6–23)
CO2: 25 mmol/L (ref 19–32)
Calcium: 8.5 mg/dL (ref 8.4–10.5)
Chloride: 103 mEq/L (ref 96–112)
Creatinine, Ser: 0.65 mg/dL (ref 0.50–1.10)
GFR calc non Af Amer: >90 mL/min (ref >90)
Glucose, Bld: 107 mg/dL — ABNORMAL HIGH (ref 70–99)
Potassium: 3.5 mmol/L (ref 3.5–5.1)
Sodium: 138 mmol/L (ref 135–145)

## 2014-09-26 MED ORDER — PANTOPRAZOLE SODIUM 40 MG PO TBEC: 40.0000 mg | DELAYED_RELEASE_TABLET | Freq: Every day | ORAL | Status: DC

## 2014-09-26 NOTE — Procedures (Signed)
Objective Swallowing Evaluation: Modified Barium Swallowing Study  Patient Details  Name: Angela Serrano MRN: [id number] Date of Birth: [birth year]  Today's Date: 09/26/2014 Time: 0900-0922 SLP Time Calculation (min) (ACUTE ONLY): 22 min  Past Medical History:  Past Medical History  Diagnosis Date  . Seasonal allergies 2011    sneezing and hoarseness  . Poor dentition   . Acid reflux disease    Past Surgical History:  Past Surgical History  Procedure Laterality Date  . Myomectomy  2004  . Tubal ligation  1990  . Esophagogastroduodenoscopy N/A 09/25/2014    Procedure: ESOPHAGOGASTRODUODENOSCOPY (EGD);  Surgeon: Ladene Artist, MD;  Location[facility] ENDOSCOPY;  Service: Endoscopy;  Laterality: N/A;  . Savory dilation N/A 09/25/2014    Procedure: SAVORY DILATION;  Surgeon: Ladene Artist, MD;  Location[facility] ENDOSCOPY;  Service: Endoscopy;  Laterality: N/A;   HPI:  Angela Serrano is a 54 y.o. female presenting with vague chest complaints of difficulty swallowing/globus sensation and left flank/rib pain, found to have pericardial effusion on imaging. PMH is significant for GERD, tobacco abuse, daily alcohol consumption.Barium swallow with apparent mass at base of tongue, not confirmed on CT.     Assessment / Plan / Recommendation Clinical Impression  Dysphagia Diagnosis: Within Functional Limits Clinical impression: Patient presents with normal swallowing function with timely oral transit, initiation of the swallow, clearance of bolus, and full airway protection. Reviewed imaged from barium swallow. Abnormality documented as possible mass at base of tongue region not seen on todays exam. Reviewed general esophageal precautions with patient as suspect that symptoms are primarily esophageal in nature given EGD results. Patient verbalized understanding. No further SLP needs indicated at this time.     Treatment Recommendation  No treatment recommended at this time    Diet Recommendation Regular;Thin  liquid   Liquid Administration via: Cup;Straw Medication Administration: Whole meds with liquid Supervision: Patient able to self feed Compensations: Small sips/bites;Slow rate;Follow solids with liquid Postural Changes and/or Swallow Maneuvers: Seated upright 90 degrees;Upright 30-60 min after meal    Other  Recommendations Oral Care Recommendations: Oral care BID   Follow Up Recommendations  None            General Date of Onset: 09/22/14 HPI: Angela Serrano is a 54 y.o. female presenting with vague chest complaints of difficulty swallowing/globus sensation and left flank/rib pain, found to have pericardial effusion on imaging. PMH is significant for GERD, tobacco abuse, daily alcohol consumption.Barium swallow with apparent mass at base of tongue, not confirmed on CT. Type of Study: Modified Barium Swallowing Study Reason for Referral: Objectively evaluate swallowing function Previous Swallow Assessment: see HPI Diet Prior to this Study: Regular;Thin liquids Temperature Spikes Noted: No Respiratory Status: Room air History of Recent Intubation: No Behavior/Cognition: Alert;Cooperative;Pleasant mood Oral Cavity - Dentition: Poor condition;Missing dentition Oral Motor / Sensory Function: Within functional limits Self-Feeding Abilities: Able to feed self Patient Positioning: Upright in chair Baseline Vocal Quality: Clear Volitional Cough: Strong Volitional Swallow: Able to elicit Anatomy: Within functional limits (see clinical impression statement) Pharyngeal Secretions: Not observed secondary MBS    Reason for Referral Objectively evaluate swallowing function   Oral Phase Oral Preparation/Oral Phase Oral Phase: WFL   Pharyngeal Phase Pharyngeal Phase Pharyngeal Phase: Within functional limits  Cervical Esophageal Phase    GO    Cervical Esophageal Phase Cervical Esophageal Phase: [facility]    Functional Assessment Tool Used: skilled clinical judgement Functional  Limitations: Swallowing Swallow Current Status (U4403): 0 percent impaired, limited  or restricted Swallow Goal Status [id number]): 0 percent impaired, limited or restricted Swallow Discharge Status [phone]): 0 percent impaired, limited or restricted   Gabriel Rainwater MA, CCC-SLP [phone]  Angela Serrano Angela Serrano 09/26/2014, 9:26 AM

## 2014-09-26 NOTE — Telephone Encounter (Signed)
Recall entered for [id number]

## 2014-09-26 NOTE — Progress Notes (Signed)
Daily Rounding Note  09/26/2014, 9:50 AM  LOS: 4 days   SUBJECTIVE:       No swallowing problems today with HH diet.  Ready for discharge.  OBJECTIVE:         Vital signs in last 24 hours:    Temp:  [99.1 F (37.3 C)-102.9 F (39.4 C)] 99.2 F (37.3 C) (01/06 0410) Pulse Rate:  [96-133] 102 (01/06 0410) Resp:  [18-39] 18 (01/06 0410) BP: (100-179)/(57-159) 102/57 mmHg (01/06 0410) SpO2:  [92 %-100 %] 100 % (01/06 0410) Weight:  [110 lb 1.6 oz (49.941 kg)-113 lb (51.256 kg)] 110 lb 1.6 oz (49.941 kg) (01/06 0410) Last BM Date: 09/25/14 Filed Weights   09/25/14 0515 09/25/14 1321 09/26/14 0410  Weight: 113 lb 1.6 oz (51.302 kg) 113 lb (51.256 kg) 110 lb 1.6 oz (49.941 kg)   General: pleasant, thin, comfortable.     Heart: RRR Chest: clear bil.  No labored breathing or cough.  Abdomen: soft, NT, ND.  BS active  Extremities: no CCE.  Neuro/Psych:  Pleasant, conversant, oriented x 3.   In good spirits.   Intake/Output from previous day: 01/05 0701 - 01/06 0700 In: 322 [P.O.:222; I.V.:100] Out: 300 [Urine:300]  Intake/Output this shift:    Lab Results:  Recent Labs  09/25/14 0406  WBC 10.5  HGB 11.1*  HCT 33.7*  PLT 524*   BMET  Recent Labs  09/25/14 0406 09/26/14 0417  NA 137 138  K 3.4* 3.5  CL 102 103  CO2 24 25  GLUCOSE 72 107*  BUN <5* 8  CREATININE 0.69 0.65  CALCIUM 8.4 8.5   LFT No results for input(s): PROT, ALBUMIN, AST, ALT, ALKPHOS, BILITOT, BILIDIR, IBILI in the last 72 hours. PT/INR No results for input(s): LABPROT, INR in the last 72 hours. Hepatitis Panel No results for input(s): HEPBSAG, HCVAB, HEPAIGM, HEPBIGM in the last 72 hours.  Studies/Results: Dg Chest 2 View  09/25/2014   CLINICAL DATA:  Fever.  Shortness of breath.  Chest pain  EXAM: CHEST  2 VIEW  COMPARISON:  09/22/2014  FINDINGS: Moderate enlargement of the cardiopericardial silhouette, cardiothoracic index 60%.  Small bilateral pleural effusions. No edema. Mild atelectasis in the left lower lobe similar to recent chest CT.  IMPRESSION: 1. Moderate enlargement of the cardiopericardial silhouette. At least some of this is attributable to the pericardial effusion, which was reported out as trivial on echocardiography but is at least moderate in volume on the recent CT scan, with distention of the superior pericardial recesses. 2. Small bilateral pleural effusions with mild atelectasis in the left lower lobe.   Electronically Signed   By: Sherryl Barters M.D.   On: 09/25/2014 10:31   Ct Soft Tissue Neck W Contrast  09/24/2014   CLINICAL DATA:  Sensation of something being stuck in the throat. Chest pain during swallowing. Contrast swallow study showed a mass in the oropharynx at the level of the tongue base.  EXAM: CT NECK WITH CONTRAST  TECHNIQUE: Multidetector CT imaging of the neck was performed using the standard protocol following the bolus administration of intravenous contrast.  CONTRAST:  15mL OMNIPAQUE IOHEXOL 300 MG/ML  SOLN  COMPARISON:  Swallowing study same day.  FINDINGS: Pharynx and larynx: No mucosal or submucosal lesion is seen. No evidence of tongue base mass.  Salivary glands: Both parotid glands are normal. Both submandibular glands are normal.  Thyroid: Symmetric and normal  Lymph nodes: No enlarged lymph nodes  on either side of the neck.  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 Mild cervical spondylosis.  Upper chest: Layering pleural effusion on the left with dependent volume loss.  IMPRESSION: There is no evidence of mucosal or submucosal mass of the tongue base. No finding to correlate with the swallowing study.   Electronically Signed   By: Nelson Chimes M.D.   On: 09/24/2014 17:34   Dg Esophagus  09/24/2014   CLINICAL DATA:  Chronic GE reflux, recent  globus sensation in the back of the throat, recent mid chest pain during swallows, recent sensation of solid food getting stuck in the throat.  EXAM: ESOPHOGRAM / BARIUM SWALLOW / BARIUM TABLET STUDY  TECHNIQUE: Combined double contrast and single contrast examination was performed using effervescent crystals, thick barium liquid, and thin barium liquid. The patient was observed with fluoroscopy swallowing a 13 mm barium sulfate tablet.  FLUOROSCOPY TIME:  2 min 24 sec, pulsed fluoroscopy.  COMPARISON:  None.  FINDINGS: Patient swallowed the thick and thin barium liquid without difficulty. Primary esophageal peristalsis is well-preserved, with only slight, insignificant break up of the primary wave in the upper esophagus on a single swallow, without break up of the primary wave on subsequent swallows. No fixed esophageal strictures or masses. No evidence of hiatal hernia. Gastroesophageal reflux was not elicited with use of the water siphon maneuver.  Evaluation of the pharynx during rapid sequence imaging with swallows of thin barium liquid demonstrate a mass at the level of the base of the tongue which causes a filling defect in the swallowed barium. There is also evidence of laryngeal penetration to the level of the cords, though there is no evidence of frank tracheal aspiration.  IMPRESSION: 1. Mass in the oropharynx at the level of the base of the tongue. CT of the neck with contrast is recommended in further evaluation to confirm or deny this finding. 2. Laryngeal penetration of thin barium liquid to the level of the vocal cords, without evidence of frank tracheal aspiration. 3. No significant abnormality involving the esophagus.   Electronically Signed   By: Evangeline Dakin M.D.   On: 09/24/2014 14:59   Dg Swallowing Func-speech Pathology MBSS 09/26/2014      Assessment / Plan / Recommendation Clinical Impression  Dysphagia Diagnosis: Within Functional Limits Clinical impression: Patient presents with  normal swallowing  function with timely oral transit, initiation of the swallow,  clearance of bolus, and full airway protection. Reviewed imaged  from barium swallow. Abnormality documented as possible mass at  base of tongue region not seen on todays exam. Reviewed general  esophageal precautions with patient as suspect that symptoms are  primarily esophageal in nature given EGD results. Patient  verbalized understanding. No further SLP needs indicated at this  time.     Treatment Recommendation  No treatment recommended at this time    Diet Recommendation Regular;Thin liquid   Liquid Administration via: Cup;Straw Medication Administration: Whole meds with liquid Supervision: Patient able to self feed Compensations: Small sips/bites;Slow rate;Follow solids with  liquid Postural Changes and/or Swallow Maneuvers: Seated upright 90  degrees;Upright 30-60 min after meal    Other  Recommendations Oral Care Recommendations: Oral care BID   Follow Up Recommendations  None         Baseline Vocal Quality: Clear Volitional Cough: Strong Volitional Swallow: Able to elicit Anatomy: Within functional limits (see clinical impression  statement) Pharyngeal Secretions: Not observed secondary MBS   Oral Phase: WFL   Pharyngeal Phase: Within functional limits  Cervical Esophageal Phase    GO    Cervical Esophageal Phase: [facility]    Functional Assessment Tool Used: skilled clinical judgement Functional Limitations: Swallowing Swallow Current Status (N1700): 0 percent impaired, limited or  restricted Swallow Goal Status (F7494): 0 percent impaired, limited or  restricted Swallow Discharge Status [phone]): 0 percent impaired Leah McCoy MA, CCC-SLP [phone]   ASSESMENT:   * Dysphagia to solids, new. No hx GERD. Esophagram with oropharyngeal mass, not seen on CT of neck. EGD 09/25/13:  Erosive gastritis, normal esophagus.  Was empirically dilated.  MBSS 09/26/13: No abnormalities/pathology.  Normal study  * Pericardial  effusion.   * Fevers.     PLAN   *  No plans for further studies.  Will sign off.  *  Spoke with pt about screening colonoscopy as she is 68.  She is interested but has limited funds and no [employer]   Will consider scheduling this after she gets her tax refund.  Will ask GI office to send her a reminder letter in a few months.   *  For gastritis can treat with daily PPI or H2blocker.  D/w the pt.  *  Can a counsellor help her negotiate healthcare.gov insurance options, see if this is within her financial means?    Azucena Freed  09/26/2014, 9:50 AM Pager: [phone]     Attending physician's note   I have taken an interval history, reviewed the chart and examined the patient. I agree with the Advanced Practitioner's note, impression and recommendations. No GI follow up at this time. Pt to contact us if she want to proceed with an outpatient screening colonoscopy. Follow up with her PCP. GI signing off.   Pricilla Riffle. Fuller Plan, MD [facility]

## 2014-09-26 NOTE — Telephone Encounter (Signed)
-----   Message from Vena Rua, PA-C sent at 09/26/2014 11:53 AM EST ----- Regarding: sending reminder for screening colonoscopy [place].  Ms Ronnald Ramp is 57 and would like to receive reminder letter re: screening colonoscopy.  At present her finances will not allow her to schedule this so she declined being set up for direct colonoscopy.  She thinks maybe she can afford it after she receives her tax refund.  So can you put her on a call back/reminder list for March/April 2016?  Thanks. Judson Roch

## 2014-10-02 LAB — HIV ANTIBODY (ROUTINE TESTING W REFLEX): HIV 1/HIV 2 AB: NONREACTIVE

## 2014-10-03 ENCOUNTER — Encounter: Payer: Self-pay | Admitting: Family Medicine

## 2014-10-03 ENCOUNTER — Ambulatory Visit (INDEPENDENT_AMBULATORY_CARE_PROVIDER_SITE_OTHER): Payer: Self-pay | Admitting: Family Medicine

## 2014-10-03 VITALS — BP 115/78 | HR 104 | Temp 98.1°F | Ht 61.0 in | Wt 111.0 lb

## 2014-10-03 DIAGNOSIS — R5383 Other fatigue: Secondary | ICD-10-CM

## 2014-10-03 DIAGNOSIS — R509 Fever, unspecified: Secondary | ICD-10-CM

## 2014-10-03 DIAGNOSIS — R319 Hematuria, unspecified: Secondary | ICD-10-CM

## 2014-10-03 DIAGNOSIS — I313 Pericardial effusion (noninflammatory): Secondary | ICD-10-CM

## 2014-10-03 DIAGNOSIS — Z1211 Encounter for screening for malignant neoplasm of colon: Secondary | ICD-10-CM

## 2014-10-03 DIAGNOSIS — I319 Disease of pericardium, unspecified: Secondary | ICD-10-CM

## 2014-10-03 DIAGNOSIS — D649 Anemia, unspecified: Secondary | ICD-10-CM

## 2014-10-03 DIAGNOSIS — R1310 Dysphagia, unspecified: Secondary | ICD-10-CM

## 2014-10-03 DIAGNOSIS — I3139 Other pericardial effusion (noninflammatory): Secondary | ICD-10-CM

## 2014-10-03 LAB — POCT URINALYSIS DIPSTICK
Glucose, UA: NEGATIVE
Leukocytes, UA: NEGATIVE
Nitrite, UA: NEGATIVE
Protein, UA: 30
Spec Grav, UA: >=1.03
Urobilinogen, UA: >=8
pH, UA: 5.5

## 2014-10-03 LAB — POCT UA - MICROSCOPIC ONLY

## 2014-10-03 NOTE — Patient Instructions (Signed)
Good to see you.  We are getting urine today and I will call if it is NOT normal. Follow up in 2 weeks so we can see how you are doing. Keep a journal of any fevers. Get your mammogram and do the stool cards. Come in the future for a health maintenance visit that is only for that. Seek immediate care with chest pain, trouble breathing, sweating, dizziness or fainting, or other concerns. Discuss Financial Assistance coverage with Pamala Hurry. The pericardial effusion was small. It should be followed if symptoms return. Work on quitting smoking. Make an appointment for this when you are ready to discuss.  Best,  Hilton Sinclair, MD

## 2014-10-03 NOTE — Progress Notes (Signed)
Patient ID: Angela Serrano, female   DOB: 08/18/1961, 54 y.o.   MRN: [id number] Subjective:   CC: Hospital follow up  HPI:   Pericardial effusion Admitted 1/2-09/26/14 for vague chest complaints, found to have pericardial effusion on CT. Echo showed trivial effusion. Troponins neg x 3. Started rheum workup initially. ESR 50, CRP 13, ANA neg, lipids showed no indication for statin, TSH normal, A11c 5.7, card consult signed off when pericardial effusion shown to be trivial.  Since hospitalization, Monday had cramp in chest making it hard to walk. Seemed like something popped when breathed deeply and pain went away. When laid down, feels something 'shifting over' and some 5/10 pain. Occasional mild dyspnea not as bad. No new leg swelling, dizziness other than Monday when first when back to work. Staying hydrated.  Intermittent fevers Febrile multiple evenings, elevated CRP with no evidence of cancer or acute bacterial infection or collagen vascualr disease on extensive eval. CXR, UA, exam unremarkable.   Since hospitalization, she has had hot flashes. Still had some subjective fevers up until 2 days after discharge. Has never had colonoscopy. Has no bloody or black stool or FH colon cancer. Had mammogram 2 years ago that had reportedly fibrocystic change, not seen since. Pap smear at least 2 years ago. Only weight loss has been during hsopitalization. Night sweats 2 nights ago, she thinks may be related to hot flashes. LMP 2014 with no spotting.  Dysphagia Barium swallow with apparent mass at base of tongue, not confirmed on CT. GI consulted and performed EGD with empiric dilation. Symptoms improved thereafter. Will continue protonix as outpatient.  Since hospitalization, swallowing has been better and able to PO what she wants. No nasuea/vomiting.  Hematuria: Inconsistent report of dysuria in hospital. Urinalysis not consistent with infection. She did have moderate blood, but CT of the abdomen was  negative for any kidney stones. Resolved prior to discharge.  Since hospitalization, urine still dark. No fevers or chills in past few day. No pelvic, abdominal, or back pain. No dysuria.  Review of Systems - Per HPI. Also, still quite fatigued though able to get around.  PMH - GERD, tobacco abuse, daily alcohol Smoking status: Down to 3 cig/day. 12 oz / day prior; 2 beers since home from hosp    Objective:  Physical Exam BP 115/78 mmHg  Pulse 104  Temp(Src) 98.1 F (36.7 C) (Oral)  Ht $R'5\' 1"'CO$  (1.549 m)  Wt 111 lb (50.349 kg)  BMI 20.98 kg/m2  SpO2 93%  LMP 04/27/2013 GEN: NAD, mildly tired-appearing CV: RR, no m/r/g, 2+ B radial pulses PULM: CTAB, normal effort EXTR: No LE edema  NEURO: Normal speech HEENT: Sclera clear, EOMI\    Assessment:     Angela Serrano is a 54 y.o. female here for hospital follow up    Plan:     # See problem list and after visit summary for problem-specific plans.   # Health Maintenance: Stool cards given; mammogram card given; return for well visit  Follow-up: Follow up in 2 weeks for f/u of symptoms and further w/u.   Hilton Sinclair, MD [place]

## 2014-10-04 DIAGNOSIS — R5383 Other fatigue: Secondary | ICD-10-CM | POA: Insufficient documentation

## 2014-10-04 DIAGNOSIS — D649 Anemia, unspecified: Secondary | ICD-10-CM | POA: Insufficient documentation

## 2014-10-04 NOTE — Assessment & Plan Note (Signed)
Dysphagia has improved since EGD dilation. Nothing found on EGD or CT, making ba swallow findings likely false positives. - return if dysphagia returns.

## 2014-10-04 NOTE — Assessment & Plan Note (Signed)
Still with dark urine. Concern for hemolysis vs bladder cancer. No dysuria.  - UA today

## 2014-10-04 NOTE — Assessment & Plan Note (Signed)
Pericardial effusion trivial size per echo, with elevated ESR (50) and neg ANA. CBC, BMET, TSH, troponins, and CXR unremarkable. Concern for autoimmune. Symptoms slowly resolved with intermittent chest cramping likely unrelated and MSK given "popping" sensation. Concern for  - Repeat echocardiogram 6 weeks or sooner if any symptoms. Per cards, if no change in size, no further cardiac workup.

## 2014-10-04 NOTE — Assessment & Plan Note (Signed)
Anemia - new since about 1 year ago. Hgb 11.1, MCV and RDW normal. - Recheck in 2 weeks

## 2014-10-04 NOTE — Assessment & Plan Note (Signed)
No focal symptoms. Likely deconditioning from recent hospitalization. - Slowly work back into daily routine. - Stay active to keep building muscle. - 3 meals daily.

## 2014-10-04 NOTE — Assessment & Plan Note (Signed)
Intermittent fevers - Resolved 2 days after discharge. unclear etiology with neg CXR and UA and no exam findings of cellulitis or pneumonia. Consider cancer (h/o chronic alcohol and tobacco use) vs rheum dx with pericardial effusion and elevated ESR and CRP (FH of autoimmune dz). HIV neg. About 10 lbs weight loss. TSH normal. CBC with anemia (11.1), and thyrombocytosis (524). Dysphagia on admission, improved with dilation. - Discussed age-appropriate cancer screening: Given stool cards with no insurance to cover colonoscopy; given information on mammogram; return for well woman to discuss pap. - Talk to [place] re: insurance to get colonoscopy. - If more fever, return. - F/u 2 weeks for further workup - Monitor dysphagia and hematuria closely. - CBC at f/u. - Consider bladder and renal imaging with blood in UA. REpeat UA today.

## 2014-10-05 ENCOUNTER — Telehealth: Payer: Self-pay | Admitting: Family Medicine

## 2014-10-05 NOTE — Telephone Encounter (Signed)
Please let Angela Serrano know that her urine mostly looked dehydrated and had a small amount of blood. She should stay hydrated and we will recheck this at her next visit. If it still has any blood, we will do cystoscopy (closer look at her bladder).  We will also recheck her blood counts to be sure these are not further dropping and her platelets are stable.  It is important for her to discuss insurance coverage with Eusebio Friendly between now and then.  Hilton Sinclair, MD

## 2014-10-05 NOTE — Telephone Encounter (Signed)
LM for patient to call back. Jazmin Hartsell,CMA  

## 2014-10-07 ENCOUNTER — Inpatient Hospital Stay (HOSPITAL_COMMUNITY)
Admission: EM | Admit: 2014-10-07 | Discharge: 2014-10-11 | DRG: 194 | Disposition: A | Discharge status: Home / Self Care | Payer: Self-pay | Attending: Family Medicine | Admitting: Family Medicine

## 2014-10-07 ENCOUNTER — Encounter (HOSPITAL_COMMUNITY): Payer: Self-pay | Admitting: *Deleted

## 2014-10-07 ENCOUNTER — Emergency Department (HOSPITAL_COMMUNITY): Payer: Self-pay

## 2014-10-07 DIAGNOSIS — D473 Essential (hemorrhagic) thrombocythemia: Secondary | ICD-10-CM | POA: Diagnosis present

## 2014-10-07 DIAGNOSIS — K219 Gastro-esophageal reflux disease without esophagitis: Secondary | ICD-10-CM | POA: Diagnosis present

## 2014-10-07 DIAGNOSIS — I3139 Other pericardial effusion (noninflammatory): Secondary | ICD-10-CM | POA: Diagnosis present

## 2014-10-07 DIAGNOSIS — J90 Pleural effusion, not elsewhere classified: Secondary | ICD-10-CM | POA: Diagnosis present

## 2014-10-07 DIAGNOSIS — R634 Abnormal weight loss: Secondary | ICD-10-CM | POA: Insufficient documentation

## 2014-10-07 DIAGNOSIS — I313 Pericardial effusion (noninflammatory): Secondary | ICD-10-CM | POA: Diagnosis present

## 2014-10-07 DIAGNOSIS — R198 Other specified symptoms and signs involving the digestive system and abdomen: Secondary | ICD-10-CM

## 2014-10-07 DIAGNOSIS — R7982 Elevated C-reactive protein (CRP): Secondary | ICD-10-CM | POA: Insufficient documentation

## 2014-10-07 DIAGNOSIS — D649 Anemia, unspecified: Secondary | ICD-10-CM

## 2014-10-07 DIAGNOSIS — D638 Anemia in other chronic diseases classified elsewhere: Secondary | ICD-10-CM | POA: Diagnosis present

## 2014-10-07 DIAGNOSIS — J189 Pneumonia, unspecified organism: Principal | ICD-10-CM | POA: Diagnosis present

## 2014-10-07 DIAGNOSIS — F17210 Nicotine dependence, cigarettes, uncomplicated: Secondary | ICD-10-CM | POA: Diagnosis present

## 2014-10-07 DIAGNOSIS — A419 Sepsis, unspecified organism: Secondary | ICD-10-CM | POA: Diagnosis present

## 2014-10-07 DIAGNOSIS — R079 Chest pain, unspecified: Secondary | ICD-10-CM | POA: Diagnosis present

## 2014-10-07 DIAGNOSIS — I309 Acute pericarditis, unspecified: Secondary | ICD-10-CM | POA: Diagnosis present

## 2014-10-07 DIAGNOSIS — B029 Zoster without complications: Secondary | ICD-10-CM | POA: Diagnosis present

## 2014-10-07 DIAGNOSIS — Z1211 Encounter for screening for malignant neoplasm of colon: Secondary | ICD-10-CM | POA: Insufficient documentation

## 2014-10-07 LAB — BASIC METABOLIC PANEL
ANION GAP: 12 (ref 5–15)
BUN: 13 mg/dL (ref 6–23)
CALCIUM: 9.3 mg/dL (ref 8.4–10.5)
CHLORIDE: 100 meq/L (ref 96–112)
CO2: 25 mmol/L (ref 19–32)
Creatinine, Ser: 0.63 mg/dL (ref 0.50–1.10)
GFR calc Af Amer: >90 mL/min (ref >90)
GFR calc non Af Amer: >90 mL/min (ref >90)
Glucose, Bld: 109 mg/dL — ABNORMAL HIGH (ref 70–99)
Potassium: 4.8 mmol/L (ref 3.5–5.1)
SODIUM: 137 mmol/L (ref 135–145)

## 2014-10-07 LAB — HEPATIC FUNCTION PANEL
ALBUMIN: 2.9 g/dL — AB (ref 3.5–5.2)
ALT: 9 U/L (ref 0–35)
AST: 14 U/L (ref 0–37)
Alkaline Phosphatase: 75 U/L (ref 39–117)
BILIRUBIN TOTAL: 0.4 mg/dL (ref 0.3–1.2)
Bilirubin, Direct: <0.1 mg/dL (ref 0.0–0.3)
TOTAL PROTEIN: 7.8 g/dL (ref 6.0–8.3)

## 2014-10-07 LAB — TROPONIN I

## 2014-10-07 LAB — BRAIN NATRIURETIC PEPTIDE: B Natriuretic Peptide: 43.1 pg/mL (ref 0.0–100.0)

## 2014-10-07 LAB — LIPASE, BLOOD: Lipase: 27 U/L (ref 11–59)

## 2014-10-07 LAB — I-STAT CG4 LACTIC ACID, ED: Lactic Acid, Venous: 1.16 mmol/L (ref 0.5–2.2)

## 2014-10-07 LAB — URINALYSIS, ROUTINE W REFLEX MICROSCOPIC
BILIRUBIN URINE: NEGATIVE
Glucose, UA: NEGATIVE mg/dL
Ketones, ur: 15 mg/dL — AB
Leukocytes, UA: NEGATIVE
NITRITE: NEGATIVE
PROTEIN: NEGATIVE mg/dL
SPECIFIC GRAVITY, URINE: 1.017 (ref 1.005–1.030)
Urobilinogen, UA: 1 mg/dL (ref 0.0–1.0)
pH: 5.5 (ref 5.0–8.0)

## 2014-10-07 LAB — CBC
HCT: 54.5 % — ABNORMAL HIGH (ref 36.0–46.0)
HEMOGLOBIN: 18.2 g/dL — AB (ref 12.0–15.0)
MCH: 30.2 pg (ref 26.0–34.0)
MCHC: 33.4 g/dL (ref 30.0–36.0)
MCV: 90.5 fL (ref 78.0–100.0)
Platelets: 463 10*3/uL — ABNORMAL HIGH (ref 150–400)
RBC: 6.02 MIL/uL — AB (ref 3.87–5.11)
RDW: 12.9 % (ref 11.5–15.5)
WBC: 8.8 10*3/uL (ref 4.0–10.5)

## 2014-10-07 LAB — URINE MICROSCOPIC-ADD ON

## 2014-10-07 MED ORDER — SODIUM CHLORIDE 0.9 % IV SOLN
INTRAVENOUS | Status: DC
  Administered 2014-10-07 – 2014-10-08 (×3): via INTRAVENOUS
  Administered 2014-10-09 (×2): 1000 mL via INTRAVENOUS
  Administered 2014-10-10 (×2): via INTRAVENOUS

## 2014-10-07 MED ORDER — DEXTROSE 5 % IV SOLN
2.0000 g | Freq: Three times a day (TID) | INTRAVENOUS | Status: DC
  Administered 2014-10-08 – 2014-10-09 (×4): 2 g via INTRAVENOUS
  Filled 2014-10-07 (×7): qty 2

## 2014-10-07 MED ORDER — VANCOMYCIN HCL IN DEXTROSE 1-5 GM/200ML-% IV SOLN
1000.0000 mg | Freq: Once | INTRAVENOUS | Status: AC
  Administered 2014-10-07: 1000 mg via INTRAVENOUS
  Filled 2014-10-07: qty 200

## 2014-10-07 MED ORDER — VANCOMYCIN HCL IN DEXTROSE 750-5 MG/150ML-% IV SOLN
750.0000 mg | Freq: Two times a day (BID) | INTRAVENOUS | Status: DC
  Administered 2014-10-08 – 2014-10-10 (×5): 750 mg via INTRAVENOUS
  Filled 2014-10-07 (×8): qty 150

## 2014-10-07 MED ORDER — DEXTROSE 5 % IV SOLN
2.0000 g | Freq: Once | INTRAVENOUS | Status: AC
  Administered 2014-10-07: 2 g via INTRAVENOUS
  Filled 2014-10-07: qty 2

## 2014-10-07 MED ORDER — SODIUM CHLORIDE 0.9 % IV BOLUS (SEPSIS)
1000.0000 mL | INTRAVENOUS | Status: AC
  Administered 2014-10-07 (×2): 1000 mL via INTRAVENOUS

## 2014-10-07 MED ORDER — MORPHINE SULFATE 2 MG/ML IJ SOLN
2.0000 mg | INTRAMUSCULAR | Status: DC | PRN
  Administered 2014-10-07 – 2014-10-08 (×2): 2 mg via INTRAVENOUS
  Filled 2014-10-07 (×2): qty 1

## 2014-10-07 NOTE — H&P (Signed)
[facility] Admission History and Physical Service Pager: [phone]  Patient name: Angela Serrano Medical record number: [id number] Date of birth: November 30, 1960 Age: 54 y.o. Gender: female  Primary Care Provider: Conni Slipper, MD Consultants: none Code Status: full code (discussed with pt upon admission)  Chief Complaint: chest pain  Assessment and Plan: Angela Serrano is a 54 y.o. female presenting with chest discomfort and fevers. PMH is significant for recent admission to hospital for similar LUQ/flank pain, which resulted in EGD with esophageal dilatation.   # Chest pain: significant chest pain worsened by essentially any movement. Also seems pleuritic in nature. This is concerning given her history of continued fevers. She was hospitalized a few weeks ago for similar pain that was primarily in LUQ/left flank at that time. Initial CT scan had shown moderate pericardial effusion and pt thus had transthoracic echo done with EF 50-55%, trivial pericardial effusion. Currently meeting SIRS criteria, but no clear source. Ddx includes rheumatologic disease, endocarditis, pericarditis, pneumonia (though no prominent cough and CXR neg), ACS (though EKG unchanged and initial trop neg), musculoskeletal disease (although does not explain persistent fevers). - admit to stepdown unit (severity of CP will likely make her ineligible for floor, and meeting SIRS criteria) - cycle trop x 3 - EKG in AM - repeat transthoracic echo, if negative will likely need TTE to eval for valvular infectious process - consult cardiology in AM - tramadol prn moderate pain, morphine prn severe pain - treating for possible infection as below  # Fever: persistent since last admission. No focal signs of infection other than chest pain, ddx outlined above. ANA neg earlier this month. HIV neg. - repeat sed rate (50 during last admit) and CRP(13.1 during last admit) to trend - f/u blood cultures x 2 -  continue empiric vanc/cefepime per pharmacy - f/u vitals closely - tylenol prn fever  # Weight loss: down 17lb since July 2013. Albumin low at 2.9.  - nutrition consult - ensure UTD on age appropriate cancer screenings: no colonoscopy visible in records. Pap normal in 2013 (HPV not tested). Normal mammo in April 2013, no repeats since then.  # Sacral rash: appearance and hx consistent with herpes zoster. As present recurrence has been active for >3 weeks, will not dose acyclovir at this time  # ETOH use: endorses near daily drinking - CIWA protocol  FEN/GI: heart healthy diet, NS @ 125 cc/hr as appears dry on exam Prophylaxis: SQ heparin  Disposition: admit to stepdown, dispo pending further eval  History of Present Illness: Angela Serrano is a 54 y.o. female presenting with LUQ/chest pain.  Was recently admitted for similar sx's. Thought initially to have pericardial effusion, but this was shown to be trivial on echo. Underwent EGD with esophageal dilatation. States esophageal dysphagia sx's have since improved. She has had intermittent fevers and intermittent chest/LUQ pain since discharge. Feels better when lays still, has significant pain with any changes in position. Appetite recently decreased despite improvement in swallowing symptoms. Has been drinking liquids well. Urinating and stooling normally.   Also endorses rash on sacral area. Occurs every couple of years. Sore when it comes on, usually in times of stress. Has had present rash for about 3 weeks. Remains R sided.  Review Of Systems: Per HPI. Otherwise 12 point review of systems was performed and was unremarkable.  Patient Active Problem List   Diagnosis Date Noted  . Sepsis 10/07/2014  . Anemia 10/04/2014  . Fatigue 10/04/2014  .  Dysphagia   . Fever   . Abnormal esophagram   . Chest pain on breathing   . Globus sensation   . Hematuria   . Chest pain 09/22/2014  . Left flank pain   . Pericardial effusion   . Elevated  BP 01/11/2013  . Neck pain, bilateral 01/11/2013  . Poor sleep pattern 01/11/2013  . GERD (gastroesophageal reflux disease) 01/11/2013  . Seasonal allergies 01/11/2013  . Tobacco use 04/06/2012  . Health care maintenance 04/06/2012   Past Medical History: Past Medical History  Diagnosis Date  . Seasonal allergies 2011    sneezing and hoarseness  . Poor dentition   . Acid reflux disease    Past Surgical History: Past Surgical History  Procedure Laterality Date  . Myomectomy  2004  . Tubal ligation  1990  . Esophagogastroduodenoscopy N/A 09/25/2014    Procedure: ESOPHAGOGASTRODUODENOSCOPY (EGD);  Surgeon: Ladene Artist, MD;  Location[facility] ENDOSCOPY;  Service: Endoscopy;  Laterality: N/A;  . Savory dilation N/A 09/25/2014    Procedure: SAVORY DILATION;  Surgeon: Ladene Artist, MD;  Location[facility] ENDOSCOPY;  Service: Endoscopy;  Laterality: N/A;   Social History: History  Substance Use Topics  . Smoking status: Current Every Day Smoker -- 0.25 packs/day for 30 years    Types: Cigarettes  . Smokeless tobacco: Never Used     Comment: down to 3 cigs a day  . Alcohol Use: 0.0 oz/week    0 Not specified per week     Comment: one beer per night for sleep   Additional social history: drinks 2-3 beers nightly, cutting down on cigarettes recently (about 4 per day), no drugs  Please also refer to relevant sections of EMR.  Family History: Family History  Problem Relation Age of Onset  . Cancer Mother 52    Bone  . Alcohol abuse Father 34  . Hypertension Sister   . Hyperlipidemia Sister   . Hypertension Sister   . Thyroid disease Daughter   . Diabetes Maternal Aunt   . Cancer Maternal Aunt 60    Bone   . Diabetes Maternal Grandmother   . Cancer Maternal Aunt 63    Bone    Allergies and Medications: No Known Allergies No current facility-administered medications on file prior to encounter.   Current Outpatient Prescriptions on File Prior to Encounter  Medication Sig  Dispense Refill  . aspirin-acetaminophen-caffeine (EXCEDRIN MIGRAINE) 250-250-65 MG per tablet Take 1 tablet by mouth every 6 (six) hours as needed for headache.    . calcium carbonate (TUMS - DOSED IN MG ELEMENTAL CALCIUM) 500 MG chewable tablet Chew 1 tablet by mouth 2 (two) times daily as needed for indigestion or heartburn.    . pantoprazole (PROTONIX) 40 MG tablet Take 1 tablet (40 mg total) by mouth daily. Needs visit with PCP. (Patient not taking: Reported on 09/22/2014) 30 tablet 0  . pantoprazole (PROTONIX) 40 MG tablet Take 1 tablet (40 mg total) by mouth daily. 30 tablet 0  . traMADol (ULTRAM) 50 MG tablet Take 1 tablet (50 mg total) by mouth every 6 (six) hours as needed. (Patient not taking: Reported on 09/22/2014) 20 tablet 0    Objective: BP 115/68 mmHg  Pulse 115  Temp(Src) 100.4 F (38 C) (Oral)  Resp 32  Ht 5\' 1"  (1.549 m)  Wt 111 lb (50.349 kg)  BMI 20.98 kg/m2  SpO2 95%  LMP 04/27/2013 Exam: General: NAD, pleasant, cooperative, chronically ill appearing HEENT: NCAT, slightly dry mucous membranes, temporal wasting  bilat Cardiovascular: RRR, possible small early systolic murmur loudest at LUSB? Respiratory: CTAB, NWOB until changes positions, then seems very uncomfortable and almost tearful from pain Abdomen: soft. TTP LUQ with some mild involuntary guarding but no clear peritoneal signs. No rebound tenderness. No masses or organomegaly Extremities: No appreciable lower extremity edema bilaterally, 2+ DP pulses bilat Skin: grouped vesicular lesions over mid & left lower back overlying sacrum, not crossing midline. No surrounding edema, no current drainage from vesicles. Nails painted blue so unable to assess for splinter hemorrhages. No lesions visible on palms. Neuro: grossly nonfocal, speech normal, follows commands  Labs and Imaging: CBC BMET   Recent Labs Lab 10/07/14 1926  WBC 8.8  HGB 18.2*  HCT 54.5*  PLT 463*    Recent Labs Lab 10/07/14 1926  NA 137   K 4.8  CL 100  CO2 25  BUN 13  CREATININE 0.63  GLUCOSE 109*  CALCIUM 9.3     EKG sinus tach, vent rate 118, no obvious ST segment abnormalities  CXR: Improved small left effusion and basilar airspace disease since the most recent examination. No new abnormality. Cardiomegaly.  Leeanne Rio, MD 10/07/2014, 11:24 PM PGY-3, [place] [facility] pager: [phone], text pages welcome

## 2014-10-07 NOTE — ED Notes (Signed)
Pt c/o shortness of breath. Placed on O2 @ 2L [place]

## 2014-10-07 NOTE — ED Notes (Signed)
Patient transported to X-ray 

## 2014-10-07 NOTE — ED Provider Notes (Addendum)
CSN: [id number]     Arrival date & time 10/07/14  1908 History   First MD Initiated Contact with Patient 10/07/14 2018     Chief Complaint  Patient presents with  . Chest Pain     (Consider location/radiation/quality/duration/timing/severity/associated sxs/prior Treatment) HPI Complains of pleuritic chest pain accompanied by mild shortness of breath for 2 weeks. Patient pain is left-sided anterior under her left breast. She denies cough. Pain is pleuritic in nature and nonradiating. She admits to fever of 101 two no other nights ago no treatment prior to coming here. No vomiting no urinary symptoms Past Medical History  Diagnosis Date  . Seasonal allergies 2011    sneezing and hoarseness  . Poor dentition   . Acid reflux disease    Past Surgical History  Procedure Laterality Date  . Myomectomy  2004  . Tubal ligation  1990  . Esophagogastroduodenoscopy N/A 09/25/2014    Procedure: ESOPHAGOGASTRODUODENOSCOPY (EGD);  Surgeon: Ladene Artist, MD;  Location: [facility] ENDOSCOPY;  Service: Endoscopy;  Laterality: N/A;  . Savory dilation N/A 09/25/2014    Procedure: SAVORY DILATION;  Surgeon: Ladene Artist, MD;  Location: [facility] ENDOSCOPY;  Service: Endoscopy;  Laterality: N/A;   Family History  Problem Relation Age of Onset  . Cancer Mother 28    Bone  . Alcohol abuse Father 50  . Hypertension Sister   . Hyperlipidemia Sister   . Hypertension Sister   . Thyroid disease Daughter   . Diabetes Maternal Aunt   . Cancer Maternal Aunt 60    Bone   . Diabetes Maternal Grandmother   . Cancer Maternal Aunt 63    Bone    History  Substance Use Topics  . Smoking status: Current Every Day Smoker -- 0.25 packs/day for 30 years    Types: Cigarettes  . Smokeless tobacco: Never Used     Comment: down to 3 cigs a day  . Alcohol Use: 0.0 oz/week    0 Not specified per week     Comment: one beer per night for sleep   OB History    Gravida Para Term Preterm AB TAB SAB Ectopic Multiple Living   3 2  2  1 1          Review of Systems  Respiratory: Positive for shortness of breath.   Cardiovascular: Positive for chest pain.  All other systems reviewed and are negative.     Allergies  Review of patient's allergies indicates no known allergies.  Home Medications   Prior to Admission medications   Medication Sig Start Date End Date Taking? Authorizing Provider  acetaminophen (TYLENOL) 500 MG tablet Take 500 mg by mouth every 6 (six) hours as needed for mild pain.   Yes Historical Provider, MD  aspirin-acetaminophen-caffeine (EXCEDRIN MIGRAINE) [phone] MG per tablet Take 1 tablet by mouth every 6 (six) hours as needed for headache.    Historical Provider, MD  calcium carbonate (TUMS - DOSED IN MG ELEMENTAL CALCIUM) 500 MG chewable tablet Chew 1 tablet by mouth 2 (two) times daily as needed for indigestion or heartburn.    Historical Provider, MD  pantoprazole (PROTONIX) 40 MG tablet Take 1 tablet (40 mg total) by mouth daily. Needs visit with PCP. Patient not taking: Reported on 09/22/2014    Hilton Sinclair, MD  pantoprazole (PROTONIX) 40 MG tablet Take 1 tablet (40 mg total) by mouth daily. 09/26/14   Lavon Paganini, MD  traMADol (ULTRAM) 50 MG tablet Take 1 tablet (50 mg total) by  mouth every 6 (six) hours as needed. Patient not taking: Reported on 09/22/2014 09/28/13   Fredia Sorrow, MD   BP 126/90 mmHg  Pulse 116  Temp(Src) 97.9 F (36.6 C)  Resp 32  Ht 5\' 1"  (1.549 m)  Wt 111 lb (50.349 kg)  BMI 20.98 kg/m2  SpO2 99%  LMP 04/27/2013 Physical Exam  Constitutional: She is oriented to person, place, and time.  Chronically ill-appearing cachectic alert nontoxic-appearing  HENT:  Head: Normocephalic and atraumatic.  Right Ear: External ear normal.  Eyes: Conjunctivae are normal. Pupils are equal, round, and reactive to light.  Sclera icteric  Neck: Neck supple. No tracheal deviation present. No thyromegaly present.  Cardiovascular: Regular rhythm.   No murmur  heard. Tachycardic  Pulmonary/Chest: Effort normal and breath sounds normal.  Abdominal: Soft. Bowel sounds are normal. She exhibits no distension. There is no tenderness.  Musculoskeletal: Normal range of motion. She exhibits no edema or tenderness.  Neurological: She is alert and oriented to person, place, and time. No cranial nerve deficit. Coordination normal.  Skin: Skin is warm and dry. No rash noted.  Psychiatric: She has a normal mood and affect.  Nursing note and vitals reviewed.   ED Course  Procedures (including critical care time) Labs Review Labs Reviewed  CBC - Abnormal; Notable for the following:    RBC 6.02 (*)    Hemoglobin 18.2 (*)    HCT 54.5 (*)    Platelets 463 (*)    All other components within normal limits  BASIC METABOLIC PANEL  BRAIN NATRIURETIC PEPTIDE  TROPONIN I  LIPASE, BLOOD    Imaging Review No results found.   EKG Interpretation   Date/Time:  Sunday October 07 2014 19:17:16 EST Ventricular Rate:  118 PR Interval:  134 QRS Duration: 70 QT Interval:  310 QTC Calculation: 434 R Axis:   36 Text Interpretation:  Sinus tachycardia Possible Left atrial enlargement  Cannot rule out Anterior infarct , age undetermined Abnormal ECG No  significant change since last tracing Confirmed by Winfred Leeds  MD, Cassiel Fernandez  [phone]) on 10/07/2014 8:24:13 PM     Chest x-ray viewed by me Results for orders placed or performed during the hospital encounter of 10/07/14  CBC  Result Value Ref Range   WBC 8.8 4.0 - 10.5 K/uL   RBC 6.02 (H) 3.87 - 5.11 MIL/uL   Hemoglobin 18.2 (H) 12.0 - 15.0 g/dL   HCT 54.5 (H) 36.0 - 46.0 %   MCV 90.5 78.0 - 100.0 fL   MCH 30.2 26.0 - 34.0 pg   MCHC 33.4 30.0 - 36.0 g/dL   RDW 12.9 11.5 - 15.5 %   Platelets 463 (H) 150 - 400 K/uL  Basic metabolic panel  Result Value Ref Range   Sodium 137 135 - 145 mmol/L   Potassium 4.8 3.5 - 5.1 mmol/L   Chloride 100 96 - 112 mEq/L   CO2 25 19 - 32 mmol/L   Glucose, Bld 109 (H) 70 - 99  mg/dL   BUN 13 6 - 23 mg/dL   Creatinine, Ser 0.63 0.50 - 1.10 mg/dL   Calcium 9.3 8.4 - 10.5 mg/dL   GFR calc non Af Amer >90 >90 mL/min   GFR calc Af Amer >90 >90 mL/min   Anion gap 12 5 - 15  BNP (order ONLY if patient complains of dyspnea/SOB AND you have documented it for THIS visit)  Result Value Ref Range   B Natriuretic Peptide 43.1 0.0 - 100.0 pg/mL  Troponin  I  Result Value Ref Range   Troponin I <0.03 <0.031 ng/mL  Lipase, blood  Result Value Ref Range   Lipase 27 11 - 59 U/L  Urinalysis, Routine w reflex microscopic  Result Value Ref Range   Color, Urine YELLOW YELLOW   APPearance CLEAR CLEAR   Specific Gravity, Urine 1.017 1.005 - 1.030   pH 5.5 5.0 - 8.0   Glucose, UA NEGATIVE NEGATIVE mg/dL   Hgb urine dipstick SMALL (A) NEGATIVE   Bilirubin Urine NEGATIVE NEGATIVE   Ketones, ur 15 (A) NEGATIVE mg/dL   Protein, ur NEGATIVE NEGATIVE mg/dL   Urobilinogen, UA 1.0 0.0 - 1.0 mg/dL   Nitrite NEGATIVE NEGATIVE   Leukocytes, UA NEGATIVE NEGATIVE  Urine microscopic-add on  Result Value Ref Range   Squamous Epithelial / LPF FEW (A) RARE   WBC, UA 0-2 <3 WBC/hpf   RBC / HPF 3-6 <3 RBC/hpf   Bacteria, UA FEW (A) RARE   Urine-Other MUCOUS PRESENT   I-Stat CG4 Lactic Acid, ED  Result Value Ref Range   Lactic Acid, Venous 1.16 0.5 - 2.2 mmol/L   Dg Chest 2 View  10/07/2014   CLINICAL DATA:  Right chest pain and shortness of breath beginning 5 days ago.  EXAM: CHEST  2 VIEW  COMPARISON:  PA and lateral chest 09/22/2014 and 09/25/2014. CT chest 09/27/2013.  FINDINGS: Small right pleural effusion and basilar airspace disease seen on the most recent examination have improved. The right lung is clear. Mild cardiomegaly is again seen.  IMPRESSION: Improved small left effusion and basilar airspace disease since the most recent examination. No new abnormality.  Cardiomegaly.   Electronically Signed   By: Inge Rise M.D.   On: 10/07/2014 20:35   Dg Chest 2 View  09/25/2014    CLINICAL DATA:  Fever.  Shortness of breath.  Chest pain  EXAM: CHEST  2 VIEW  COMPARISON:  09/22/2014  FINDINGS: Moderate enlargement of the cardiopericardial silhouette, cardiothoracic index 60%. Small bilateral pleural effusions. No edema. Mild atelectasis in the left lower lobe similar to recent chest CT.  IMPRESSION: 1. Moderate enlargement of the cardiopericardial silhouette. At least some of this is attributable to the pericardial effusion, which was reported out as trivial on echocardiography but is at least moderate in volume on the recent CT scan, with distention of the superior pericardial recesses. 2. Small bilateral pleural effusions with mild atelectasis in the left lower lobe.   Electronically Signed   By: Sherryl Barters M.D.   On: 09/25/2014 10:31   Dg Chest 2 View  09/22/2014   CLINICAL DATA:  Pt states flank pain with difficulty urinating. Pt symptoms started this week. No discharge. No fever known. Pain goes to left side. No hx of kidney stones. Also c/o discomfort with swallowing. Feels like food doesn't go down. Is able to swallow but keeps feeling of "fullness". Increase in belching. Hx of reflux.  EXAM: CHEST - 2 VIEW  COMPARISON:  09/27/2013  FINDINGS: Mild cardiomegaly, new since prior exam. Linear subsegmental atelectasis or scarring in the lung bases. Lungs otherwise clear. No pneumothorax. No effusion. Degenerative changes in the right shoulder.  IMPRESSION: 1. Mild cardiomegaly, new since prior exam.   Electronically Signed   By: Arne Cleveland M.D.   On: 09/22/2014 12:47   Ct Soft Tissue Neck W Contrast  09/24/2014   CLINICAL DATA:  Sensation of something being stuck in the throat. Chest pain during swallowing. Contrast swallow study showed a mass in the  oropharynx at the level of the tongue base.  EXAM: CT NECK WITH CONTRAST  TECHNIQUE: Multidetector CT imaging of the neck was performed using the standard protocol following the bolus administration of intravenous contrast.   CONTRAST:  192mL OMNIPAQUE IOHEXOL 300 MG/ML  SOLN  COMPARISON:  Swallowing study same day.  FINDINGS: Pharynx and larynx: No mucosal or submucosal lesion is seen. No evidence of tongue base mass.  Salivary glands: Both parotid glands are normal. Both submandibular glands are normal.  Thyroid: Symmetric and normal  Lymph nodes: No enlarged lymph nodes on either side of the neck.  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 Mild cervical spondylosis.  Upper chest: Layering pleural effusion on the left with dependent volume loss.  IMPRESSION: There is no evidence of mucosal or submucosal mass of the tongue base. No finding to correlate with the swallowing study.   Electronically Signed   By: Nelson Chimes M.D.   On: 09/24/2014 17:34   Ct Angio Chest Pe W/cm &/or Wo Cm  09/22/2014   CLINICAL DATA:  flank pain with difficulty urinating. Pt symptoms started this week. No discharge. No fever known. Pain goes to left side. No hx of kidney stones. Also c/o discomfort with swallowing. Feels like food doesn't go down. Is able to swallow but keeps feeling of "fullness". Increase in belching. Hx of reflux. Not taking medications for it. "not like she should" Pt said left sided back ainElevated d dimer  EXAM: CT ANGIOGRAPHY CHEST WITH CONTRAST  TECHNIQUE: Multidetector CT imaging of the chest was performed using the standard protocol during bolus administration of intravenous contrast. Multiplanar CT image reconstructions and MIPs were obtained to evaluate the vascular anatomy.  CONTRAST:  11mL OMNIPAQUE IOHEXOL 350 MG/ML SOLN  COMPARISON:  09/27/2013  FINDINGS: SVC patent. RV/LV ratio less than 1. Satisfactory opacification of pulmonary arteries noted, and there is no evidence of pulmonary emboli. Patent superior and inferior pulmonary veins bilaterally. Adequate contrast opacification  of the thoracic aorta with no evidence of dissection, aneurysm, or stenosis. There is bovine variant brachiocephalic arch anatomy without proximal stenosis.  There is a moderate pericardial effusion , new since prior study. Trace bilateral pleural effusions. No hilar or mediastinal adenopathy. Moderate dependent atelectasis in both lower lobes left greater than right, and posteriorly in the left upper lobe. Thoracic spine and sternum intact. Visualized portions of upper abdomen unremarkable.  Review of the MIP images confirms the above findings.  IMPRESSION: 1. Negative for acute PE or thoracic aortic dissection. 2. Moderate pericardial effusion and trace bilateral pleural effusions, new since prior study.   Electronically Signed   By: Arne Cleveland M.D.   On: 09/22/2014 16:43   Dg Esophagus  09/24/2014   CLINICAL DATA:  Chronic GE reflux, recent globus sensation in the back of the throat, recent mid chest pain during swallows, recent sensation of solid food getting stuck in the throat.  EXAM: ESOPHOGRAM / BARIUM SWALLOW / BARIUM TABLET STUDY  TECHNIQUE: Combined double contrast and single contrast examination was performed using effervescent crystals, thick barium liquid, and thin barium liquid. The patient was observed with fluoroscopy swallowing a 13 mm barium sulfate tablet.  FLUOROSCOPY TIME:  2 min 24 sec, pulsed fluoroscopy.  COMPARISON:  None.  FINDINGS: Patient swallowed the thick and thin barium liquid without difficulty. Primary esophageal peristalsis is well-preserved, with only slight, insignificant break up of the primary  wave in the upper esophagus on a single swallow, without break up of the primary wave on subsequent swallows. No fixed esophageal strictures or masses. No evidence of hiatal hernia. Gastroesophageal reflux was not elicited with use of the water siphon maneuver.  Evaluation of the pharynx during rapid sequence imaging with swallows of thin barium liquid demonstrate a mass at the  level of the base of the tongue which causes a filling defect in the swallowed barium. There is also evidence of laryngeal penetration to the level of the cords, though there is no evidence of frank tracheal aspiration.  IMPRESSION: 1. Mass in the oropharynx at the level of the base of the tongue. CT of the neck with contrast is recommended in further evaluation to confirm or deny this finding. 2. Laryngeal penetration of thin barium liquid to the level of the vocal cords, without evidence of frank tracheal aspiration. 3. No significant abnormality involving the esophagus.   Electronically Signed   By: Evangeline Dakin M.D.   On: 09/24/2014 14:59   Dg Swallowing Func-speech Pathology  09/26/2014   Leah [name] McCoy, CCC-SLP     09/26/2014  9:27 AM Objective Swallowing Evaluation: Modified Barium Swallowing Study   Patient Details  Name: Angela Serrano MRN: [id number] Date of Birth: [birth year]  Today's Date: 09/26/2014 Time: 0900-0922 SLP Time Calculation (min) (ACUTE ONLY): 22 min  Past Medical History:  Past Medical History  Diagnosis Date  . Seasonal allergies 2011    sneezing and hoarseness  . Poor dentition   . Acid reflux disease    Past Surgical History:  Past Surgical History  Procedure Laterality Date  . Myomectomy  2004  . Tubal ligation  1990  . Esophagogastroduodenoscopy N/A 09/25/2014    Procedure: ESOPHAGOGASTRODUODENOSCOPY (EGD);  Surgeon: Ladene Artist, MD;  Location: [facility] ENDOSCOPY;  Service: Endoscopy;   Laterality: N/A;  . Savory dilation N/A 09/25/2014    Procedure: SAVORY DILATION;  Surgeon: Ladene Artist, MD;   Location: [facility] ENDOSCOPY;  Service: Endoscopy;  Laterality: N/A;   HPI:  Berdia L Lesko is a 54 y.o. female presenting with vague chest  complaints of difficulty swallowing/globus sensation and left  flank/rib pain, found to have pericardial effusion on imaging.  PMH is significant for GERD, tobacco abuse, daily alcohol  consumption.Barium swallow with apparent mass at base of tongue,  not confirmed on  CT.     Assessment / Plan / Recommendation Clinical Impression  Dysphagia Diagnosis: Within Functional Limits Clinical impression: Patient presents with normal swallowing  function with timely oral transit, initiation of the swallow,  clearance of bolus, and full airway protection. Reviewed imaged  from barium swallow. Abnormality documented as possible mass at  base of tongue region not seen on todays exam. Reviewed general  esophageal precautions with patient as suspect that symptoms are  primarily esophageal in nature given EGD results. Patient  verbalized understanding. No further SLP needs indicated at this  time.     Treatment Recommendation  No treatment recommended at this time    Diet Recommendation Regular;Thin liquid   Liquid Administration via: Cup;Straw Medication Administration: Whole meds with liquid Supervision: Patient able to self feed Compensations: Small sips/bites;Slow rate;Follow solids with  liquid Postural Changes and/or Swallow Maneuvers: Seated upright 90  degrees;Upright 30-60 min after meal    Other  Recommendations Oral Care Recommendations: Oral care BID   Follow Up Recommendations  None            General Date of  Onset: 09/22/14 HPI: SHERRINE SALBERG is a 54 y.o. female presenting with vague chest  complaints of difficulty swallowing/globus sensation and left  flank/rib pain, found to have pericardial effusion on imaging.  PMH is significant for GERD, tobacco abuse, daily alcohol  consumption.Barium swallow with apparent mass at base of tongue,  not confirmed on CT. Type of Study: Modified Barium Swallowing Study Reason for Referral: Objectively evaluate swallowing function Previous Swallow Assessment: see HPI Diet Prior to this Study: Regular;Thin liquids Temperature Spikes Noted: No Respiratory Status: Room air History of Recent Intubation: No Behavior/Cognition: Alert;Cooperative;Pleasant mood Oral Cavity - Dentition: Poor condition;Missing dentition Oral Motor / Sensory Function: Within  functional limits Self-Feeding Abilities: Able to feed self Patient Positioning: Upright in chair Baseline Vocal Quality: Clear Volitional Cough: Strong Volitional Swallow: Able to elicit Anatomy: Within functional limits (see clinical impression  statement) Pharyngeal Secretions: Not observed secondary MBS    Reason for Referral Objectively evaluate swallowing function   Oral Phase Oral Preparation/Oral Phase Oral Phase: WFL   Pharyngeal Phase Pharyngeal Phase Pharyngeal Phase: Within functional limits  Cervical Esophageal Phase    GO    Cervical Esophageal Phase Cervical Esophageal Phase: [facility]    Functional Assessment Tool Used: skilled clinical judgement Functional Limitations: Swallowing Swallow Current Status (V3748): 0 percent impaired, limited or  restricted Swallow Goal Status (O7078): 0 percent impaired, limited or  restricted Swallow Discharge Status [phone]): 0 percent impaired, limited or  restricted   Gabriel Rainwater MA, CCC-SLP [phone]  McCoy Leah [name] 09/26/2014, 9:26 AM    Ct Renal Stone Study  09/22/2014   CLINICAL DATA:  Left flank pain.  Difficulty urinating.  EXAM: CT ABDOMEN AND PELVIS WITHOUT CONTRAST  TECHNIQUE: Multidetector CT imaging of the abdomen and pelvis was performed following the standard protocol without IV contrast.  COMPARISON:  CT scan of the chest dated 09/27/2013  FINDINGS: There is a new a moderate pericardial effusion with a small left pleural effusion and slight bibasilar atelectasis. There is borderline cardiomegaly.  The liver appears normal. There is suggestion of sludge in the otherwise normal gallbladder. No dilated bile ducts. The spleen, pancreas, adrenal glands, and left kidney are normal. There is a 4.5 cm cyst on the lower pole the otherwise normal right kidney. There are no renal or ureteral or bladder calculi.  The bowel appears normal including the terminal ileum and appendix.  The uterus is enlarged, measuring 11 by 7.3 by 5.6 cm. The residual inhomogeneous  consistent with fibroids. The ovaries are normal. Bladder is normal.  No significant osseous abnormality.  IMPRESSION: 1. Moderate pericardial effusion with slight atelectasis at both lung bases along with a small left pleural effusion. 2. Uterine fibroids. 3. No acute abnormalities of the abdomen.   Electronically Signed   By: Rozetta Nunnery M.D.   On: 09/22/2014 14:44    MDM  Code sepsis called based on Sirs criteria of fever, respiratory rate and heart rate Final diagnoses:  Chest pain   Spoke with Dr. Ardelia Mems plan admit telemetry. IV fluids, IV antibiotics, pan culture Diagnosis #1 sepsis #2 chest pain     Orlie Dakin, MD 10/07/14 [id number]  Orlie Dakin, MD 10/07/14 2259

## 2014-10-07 NOTE — ED Notes (Signed)
The pt is c/o sharp lt lower chest pain for 45 minutes after drinking alcohol while wastching the game on tv.  Recent admission  For similar problems.  .  The pain is worse with movement

## 2014-10-07 NOTE — Progress Notes (Signed)
ANTIBIOTIC CONSULT NOTE - INITIAL  Pharmacy Consult for vancomycin and cefepime Indication: pneumonia  No Known Allergies  Patient Measurements: Height: 5\' 1"  (154.9 cm) Weight: 111 lb (50.349 kg) IBW/kg (Calculated) : 47.8   Vital Signs: Temp: 100.3 F (37.9 C) (01/17 2119) Temp Source: Oral (01/17 2119) BP: 124/77 mmHg (01/17 2118) Pulse Rate: 118 (01/17 2118) Intake/Output from previous day:   Intake/Output from this shift:    Labs:  Recent Labs  10/07/14 1926  WBC 8.8  HGB 18.2*  PLT 463*  CREATININE 0.63   Estimated Creatinine Clearance: 62.8 mL/min (by C-G formula based on Cr of 0.63). No results for input(s): VANCOTROUGH, VANCOPEAK, VANCORANDOM, GENTTROUGH, GENTPEAK, GENTRANDOM, TOBRATROUGH, TOBRAPEAK, TOBRARND, AMIKACINPEAK, AMIKACINTROU, AMIKACIN in the last 72 hours.   Microbiology: No results found for this or any previous visit (from the past 720 hour(s)).  Medical History: Past Medical History  Diagnosis Date  . Seasonal allergies 2011    sneezing and hoarseness  . Poor dentition   . Acid reflux disease     Assessment: Patient is a 54 y.o F admitted to the ED with c/o SOB.  CXR showed a small left effusion and basilar airspace disease.  Per Dr. Winfred Leeds, plan to also work patient up for r/o endocarditis. To start broad abx with vancomycin and cefepime for now for empiric coverage and with possibility of adding gentamicin if there's high suspicion for valve involvement.  Goal of Therapy:  Vancomycin trough level 15-20 mcg/ml  Plan:  - cefepime 2gm IV q8h - vancomycin 1gm IV x1 now, then 750mg  IV q12h - f/u renal funct, cx  Chenoah Mcnally P 10/07/2014,9:30 PM

## 2014-10-08 ENCOUNTER — Inpatient Hospital Stay (HOSPITAL_COMMUNITY): Payer: Self-pay

## 2014-10-08 ENCOUNTER — Encounter (HOSPITAL_COMMUNITY): Payer: Self-pay | Admitting: Radiology

## 2014-10-08 DIAGNOSIS — R198 Other specified symptoms and signs involving the digestive system and abdomen: Secondary | ICD-10-CM

## 2014-10-08 DIAGNOSIS — I369 Nonrheumatic tricuspid valve disorder, unspecified: Secondary | ICD-10-CM

## 2014-10-08 DIAGNOSIS — D649 Anemia, unspecified: Secondary | ICD-10-CM

## 2014-10-08 DIAGNOSIS — R079 Chest pain, unspecified: Secondary | ICD-10-CM

## 2014-10-08 DIAGNOSIS — R634 Abnormal weight loss: Secondary | ICD-10-CM

## 2014-10-08 LAB — TROPONIN I: Troponin I: <0.03 ng/mL (ref <0.031)

## 2014-10-08 LAB — MRSA PCR SCREENING: MRSA by PCR: NEGATIVE

## 2014-10-08 LAB — RETICULOCYTES
RBC.: 3.16 MIL/uL — ABNORMAL LOW (ref 3.87–5.11)
Retic Count, Absolute: 28.4 10*3/uL (ref 19.0–186.0)
Retic Ct Pct: 0.9 % (ref 0.4–3.1)

## 2014-10-08 LAB — CBC
HCT: 27.2 % — ABNORMAL LOW (ref 36.0–46.0)
HCT: 26.9 % — ABNORMAL LOW (ref 36.0–46.0)
HEMOGLOBIN: 8.7 g/dL — AB (ref 12.0–15.0)
Hemoglobin: 8.8 g/dL — ABNORMAL LOW (ref 12.0–15.0)
MCH: 28.9 pg (ref 26.0–34.0)
MCH: 29.1 pg (ref 26.0–34.0)
MCHC: 32.4 g/dL (ref 30.0–36.0)
MCHC: 32.3 g/dL (ref 30.0–36.0)
MCV: 89.5 fL (ref 78.0–100.0)
MCV: 90 fL (ref 78.0–100.0)
PLATELETS: 685 10*3/uL — AB (ref 150–400)
PLATELETS: 731 10*3/uL — AB (ref 150–400)
RBC: 3.04 MIL/uL — ABNORMAL LOW (ref 3.87–5.11)
RBC: 2.99 MIL/uL — ABNORMAL LOW (ref 3.87–5.11)
RDW: 13.1 % (ref 11.5–15.5)
RDW: 13.1 % (ref 11.5–15.5)
WBC: 10 10*3/uL (ref 4.0–10.5)
WBC: 10.2 10*3/uL (ref 4.0–10.5)

## 2014-10-08 LAB — URINE CULTURE: Colony Count: 50000

## 2014-10-08 LAB — SEDIMENTATION RATE: Sed Rate: 91 mm/hr — ABNORMAL HIGH (ref 0–22)

## 2014-10-08 LAB — C-REACTIVE PROTEIN: CRP: 14.4 mg/dL — AB (ref <0.60)

## 2014-10-08 LAB — I-STAT CG4 LACTIC ACID, ED: Lactic Acid, Venous: 0.38 mmol/L — ABNORMAL LOW (ref 0.5–2.2)

## 2014-10-08 MED ORDER — ACETAMINOPHEN 325 MG PO TABS
650.0000 mg | ORAL_TABLET | Freq: Four times a day (QID) | ORAL | Status: DC | PRN
  Administered 2014-10-08 – 2014-10-10 (×2): 650 mg via ORAL
  Filled 2014-10-08 (×2): qty 2

## 2014-10-08 MED ORDER — LORAZEPAM 2 MG/ML IJ SOLN: 1.0000 mg | Freq: Four times a day (QID) | INTRAMUSCULAR | Status: DC | PRN

## 2014-10-08 MED ORDER — TUBERCULIN PPD 5 UNIT/0.1ML ID SOLN
5.0000 [IU] | Freq: Once | INTRADERMAL | Status: AC
  Administered 2014-10-08: 5 [IU] via INTRADERMAL
  Filled 2014-10-08: qty 0.1

## 2014-10-08 MED ORDER — IOHEXOL 300 MG/ML  SOLN
25.0000 mL | INTRAMUSCULAR | Status: AC
Start: 2014-10-08 — End: 2014-10-08
  Administered 2014-10-08 (×2): 25 mL via ORAL

## 2014-10-08 MED ORDER — IOHEXOL 300 MG/ML  SOLN
80.0000 mL | Freq: Once | INTRAMUSCULAR | Status: AC | PRN
  Administered 2014-10-08: 80 mL via INTRAVENOUS

## 2014-10-08 MED ORDER — TRAMADOL HCL 50 MG PO TABS
50.0000 mg | ORAL_TABLET | Freq: Four times a day (QID) | ORAL | Status: DC | PRN
  Administered 2014-10-08 – 2014-10-09 (×3): 50 mg via ORAL
  Filled 2014-10-08 (×3): qty 1

## 2014-10-08 MED ORDER — SODIUM CHLORIDE 0.9 % IJ SOLN
3.0000 mL | Freq: Two times a day (BID) | INTRAMUSCULAR | Status: DC
  Administered 2014-10-08 – 2014-10-11 (×4): 3 mL via INTRAVENOUS

## 2014-10-08 MED ORDER — PANTOPRAZOLE SODIUM 40 MG PO TBEC
40.0000 mg | DELAYED_RELEASE_TABLET | Freq: Every day | ORAL | Status: DC
  Administered 2014-10-08 – 2014-10-11 (×4): 40 mg via ORAL
  Filled 2014-10-08 (×4): qty 1

## 2014-10-08 MED ORDER — LORAZEPAM 1 MG PO TABS: 1.0000 mg | ORAL_TABLET | Freq: Four times a day (QID) | ORAL | Status: DC | PRN

## 2014-10-08 MED ORDER — HEPARIN SODIUM (PORCINE) 5000 UNIT/ML IJ SOLN
5000.0000 [IU] | Freq: Three times a day (TID) | INTRAMUSCULAR | Status: AC
  Filled 2014-10-08 (×5): qty 1

## 2014-10-08 NOTE — Progress Notes (Signed)
PPD skin test placed on right anterior forearm and marked.  Due to be read 10/10/14 at 2149.  Jodell Cipro

## 2014-10-08 NOTE — Progress Notes (Signed)
Echocardiogram 2D Echocardiogram has been performed.  Angela Serrano 10/08/2014, 4:19 PM

## 2014-10-08 NOTE — ED Notes (Signed)
Attempted to give report 

## 2014-10-08 NOTE — ED Notes (Signed)
Dr Winfred Leeds given a copy of lactic acid results .[place]

## 2014-10-08 NOTE — Consult Note (Signed)
Referring Provider: Family Medicine Teaching Service Primary Care Physician:  Conni Slipper, MD Primary Gastroenterologist:  unassigned  Reason for Consultation:   Anemia, wt loss, elevated CRP  and ESR.   HPI: Angela Serrano is a 54 y.o. female who was recently admitted to [facility] January 2 through 09/26/2014 with the finding of a per cardial effusion on CT. Echocardiogram showed only trivial effusion and troponins were negative 3. She initially started her rheumatologic workup for concern of possible rheumatic etiology of her pericardial effusion. ESR was 50, CRP 13.1 and ANA negative. On her admission she was complaining of dysphagia. Barium swallow was obtained and showed an apparent mass at the base of her tongue however follow-up CT did not show a  mass. She underwent an EGD with dilation on 09/25/2014 and was also noted to have erosive gastritis. Symptoms of dysphagia have greatly improved. Patient presented to the emergency room again with chest pain and worsened by movement as well as with deep inspiration. She has had a persistent fever since last admission. She has noted a 17 pound weight loss since July 2013. Her H&H on January 5 were 11.1 and 33.7, and today her H&H is noted to be 8.7 and 26.9.  Patient denies any change in her bowel habits or stool caliber. She denies any bloody or tarry stools and she has not had any bright red blood per rectum. Her appetite has been diminished and she has lost weight. She has not had any abdominal pain. She has never had a colonoscopy. She is unaware of a family history of colon cancer, colon polyps, or inflammatory bowel disease.   Past Medical History  Diagnosis Date  . Seasonal allergies 2011    sneezing and hoarseness  . Poor dentition   . Acid reflux disease     Past Surgical History  Procedure Laterality Date  . Myomectomy  2004  . Tubal ligation  1990  . Esophagogastroduodenoscopy N/A 09/25/2014    Procedure:  ESOPHAGOGASTRODUODENOSCOPY (EGD);  Surgeon: Ladene Artist, MD;  Location: [facility] ENDOSCOPY;  Service: Endoscopy;  Laterality: N/A;  . Savory dilation N/A 09/25/2014    Procedure: SAVORY DILATION;  Surgeon: Ladene Artist, MD;  Location: [facility] ENDOSCOPY;  Service: Endoscopy;  Laterality: N/A;    Prior to Admission medications   Medication Sig Start Date End Date Taking? Authorizing Provider  acetaminophen (TYLENOL) 500 MG tablet Take 500 mg by mouth every 6 (six) hours as needed for mild pain.   Yes Historical Provider, MD  aspirin-acetaminophen-caffeine (EXCEDRIN MIGRAINE) [phone] MG per tablet Take 1 tablet by mouth every 6 (six) hours as needed for headache.    Historical Provider, MD  calcium carbonate (TUMS - DOSED IN MG ELEMENTAL CALCIUM) 500 MG chewable tablet Chew 1 tablet by mouth 2 (two) times daily as needed for indigestion or heartburn.    Historical Provider, MD  pantoprazole (PROTONIX) 40 MG tablet Take 1 tablet (40 mg total) by mouth daily. Needs visit with PCP. Patient not taking: Reported on 09/22/2014    Hilton Sinclair, MD  pantoprazole (PROTONIX) 40 MG tablet Take 1 tablet (40 mg total) by mouth daily. 09/26/14   Lavon Paganini, MD  traMADol (ULTRAM) 50 MG tablet Take 1 tablet (50 mg total) by mouth every 6 (six) hours as needed. Patient not taking: Reported on 09/22/2014 09/28/13   Fredia Sorrow, MD    Current Facility-Administered Medications  Medication Dose Route Frequency Provider Last Rate Last Dose  . 0.9 %  sodium chloride infusion   Intravenous Continuous Leeanne Rio, MD 125 mL/hr at 10/08/14 1305    . acetaminophen (TYLENOL) tablet 650 mg  650 mg Oral Q6H PRN Leeanne Rio, MD   650 mg at 10/08/14 0022  . ceFEPIme (MAXIPIME) 2 g in dextrose 5 % 50 mL IVPB  2 g Intravenous Q8H Anh P Pham, RPH   2 g at 10/08/14 1321  . heparin injection 5,000 Units  5,000 Units Subcutaneous 3 times per day Leeanne Rio, MD   Stopped at 10/08/14 1316  . LORazepam  (ATIVAN) tablet 1 mg  1 mg Oral Q6H PRN Leeanne Rio, MD       Or  . LORazepam (ATIVAN) injection 1 mg  1 mg Intravenous Q6H PRN Leeanne Rio, MD      . morphine 2 MG/ML injection 2 mg  2 mg Intravenous Q2H PRN Leeanne Rio, MD   2 mg at 10/08/14 0236  . pantoprazole (PROTONIX) EC tablet 40 mg  40 mg Oral Daily Leeanne Rio, MD   40 mg at 10/08/14 0953  . sodium chloride 0.9 % injection 3 mL  3 mL Intravenous Q12H Leeanne Rio, MD   3 mL at 10/08/14 1000  . traMADol (ULTRAM) tablet 50 mg  50 mg Oral Q6H PRN Leeanne Rio, MD   50 mg at 10/08/14 0800  . tuberculin injection 5 Units  5 Units Intradermal Once Katheren Shams, DO      . vancomycin (VANCOCIN) IVPB 750 mg/150 ml premix  750 mg Intravenous Q12H Anh P Pham, RPH   750 mg at 10/08/14 0953    Allergies as of 10/07/2014  . (No Known Allergies)    Family History  Problem Relation Age of Onset  . Cancer Mother 52    Bone  . Alcohol abuse Father 22  . Hypertension Sister   . Hyperlipidemia Sister   . Hypertension Sister   . Thyroid disease Daughter   . Diabetes Maternal Aunt   . Cancer Maternal Aunt 60    Bone   . Diabetes Maternal Grandmother   . Cancer Maternal Aunt 63    Bone     History   Social History  . Marital Status: Single    Spouse Name: N/A    Number of Children: 3  . Years of Education: N/A   Occupational History  . [employer]    Social History Main Topics  . Smoking status: Current Every Day Smoker -- 0.25 packs/day for 30 years    Types: Cigarettes  . Smokeless tobacco: Never Used     Comment: down to 3 cigs a day  . Alcohol Use: 0.0 oz/week    0 Not specified per week     Comment: one beer per night for sleep  . Drug Use: No  . Sexual Activity: No   Other Topics Concern  . Not on file   Social History Narrative    Review of Systems: Gen:Has felt feverish since last admission CV: Has left sided chest pain with  movement. Resp: Denies dyspnea at rest, dyspnea with exercise, cough, sputum, wheezing, coughing up blood, and pleurisy. GI: Denies vomiting blood, jaundice, and fecal incontinence.   Denies dysphagia or odynophagia. GU : Denies urinary burning, blood in urine, urinary frequency, urinary hesitancy, nocturnal urination, and urinary incontinence. MS: Says her legs ached all weekend. Derm: Has a rash on sacrum. Psych: Denies  depression, anxiety, memory loss, suicidal ideation, hallucinations, paranoia, and confusion. Heme: Denies bruising, bleeding, and enlarged lymph nodes. Neuro:  Denies any headaches, dizziness, paresthesias. Endo:  Denies any problems with DM, thyroid, adrenal function.  Physical Exam: Vital signs in last 24 hours: Temp:  [97.9 F (36.6 C)-101.3 F (38.5 C)] 98.5 F (36.9 C) (01/18 1151) Pulse Rate:  [66-120] 66 (01/18 1151) Resp:  [11-37] 26 (01/18 1151) BP: (97-149)/(63-90) 119/73 mmHg (01/18 1151) SpO2:  [92 %-100 %] 96 % (01/18 1151) Weight:  [111 lb (50.349 kg)-111 lb 12.4 oz (50.7 kg)] 111 lb 12.4 oz (50.7 kg) (01/18 0205) Last BM Date: 10/05/14 General:   Alert,  Well-developed, well-nourished, thin, ill appearing female in NAD Head:  Normocephalic and atraumatic.temporal wasting noted Eyes:  Sclera clear, no icterus.   Conjunctiva pink. Ears:  Normal auditory acuity. Nose:  No deformity, discharge,  or lesions. Mouth:  No deformity or lesions.  Mucus membranes dry Neck:  Supple; no masses or thyromegaly. Lungs:  Clear throughout to auscultation.   No wheezes, crackles, or rhonchi.  Heart:  Regular rate and rhythm, 6-7/3 systolic murmur Abdomen:  Soft,nontender, BS active,nonpalp mass or hsm.   Rectal:  Deferred per pt request Msk:  Symmetrical without gross deformities. . Pulses:  Normal pulses noted. Extremities:  Without clubbing or edema. Neurologic:  Alert and  oriented x4;  grossly normal neurologically. Skin:  Vesicular  Grouped lesion over mid  and left  Lower back Psych:  Alert and cooperative. Normal mood and affect.  Intake/Output from previous day: 01/17 0701 - 01/18 0700 In: 2817.5 [I.V.:2767.5; IV Piggyback:50] Out: 650 [Urine:650] Intake/Output this shift: Total I/O In: -  Out: 750 [Urine:750]  Lab Results:  Recent Labs  10/07/14 1926 10/08/14 0905 10/08/14 1100  WBC 8.8 10.0 10.2  HGB 18.2* 8.8* 8.7*  HCT 54.5* 27.2* 26.9*  PLT 463* 685* 731*    CBC on 09/25/2014 had a white blood cell count 10.5, hemoglobin 11.1, hematocrit 33.7, platelets 524,000. BMET  Recent Labs  10/07/14 1926  NA 137  K 4.8  CL 100  CO2 25  GLUCOSE 109*  BUN 13  CREATININE 0.63  CALCIUM 9.3   LFT  Recent Labs  10/07/14 2148  PROT 7.8  ALBUMIN 2.9*  AST 14  ALT 9  ALKPHOS 75  BILITOT 0.4  BILIDIR <0.1  IBILI NOT CALCULATED   Troponins on 10/08/2014 were less than 0.03 Sedimentation rate on 10/08/2014 was 91 and C-reactive protein 14.4    Studies/Results: Dg Chest 2 View  10/07/2014   CLINICAL DATA:  Right chest pain and shortness of breath beginning 5 days ago.  EXAM: CHEST  2 VIEW  COMPARISON:  PA and lateral chest 09/22/2014 and 09/25/2014. CT chest 09/27/2013.  FINDINGS: Small right pleural effusion and basilar airspace disease seen on the most recent examination have improved. The right lung is clear. Mild cardiomegaly is again seen.  IMPRESSION: Improved small left effusion and basilar airspace disease since the most recent examination. No new abnormality.  Cardiomegaly.   Electronically Signed   By: Inge Rise M.D.   On: 10/07/2014 20:35   Dg Abd Portable 1v  10/08/2014   CLINICAL DATA:  involuntary guarding of the abdomen.  EXAM: ABDOMEN - 1 VIEW  COMPARISON:  None.  FINDINGS: There are no dilated loops of small bowel or air-fluid levels noted. Within the left lower quadrant there are are a few normal caliber, air-filled loops of small bowel noted. Gas is seen in throughout  the colon up to the rectum.   IMPRESSION: Nonobstructive bowel gas pattern.   Electronically Signed   By: Kerby Moors M.D.   On: 10/08/2014 09:19   DG Swallowing with speech path  On 09/26/14: Swallowing Func-Speech Pathology   Status: Final result       PACS Images     Show images for [facility] Swallowing [facility] Pathology     Result Narrative     Earley Brooke, CCC-SLP   09/26/2014 9:27 AM Objective Swallowing Evaluation: Modified Barium Swallowing Study   Patient Details  Name: NATARA MONFORT MRN: [id number] Date of Birth: Nov 22, 1960  Today's Date: 09/26/2014 Time: 0900-0922 SLP Time Calculation (min) (ACUTE ONLY): 22 min  Past Medical History:  Past Medical History  Diagnosis Date  . Seasonal allergies 2011  sneezing and hoarseness  . Poor dentition  . Acid reflux disease   Past Surgical History:  Past Surgical History  Procedure Laterality Date  . Myomectomy 2004  . Tubal ligation 1990  . Esophagogastroduodenoscopy N/A 09/25/2014  Procedure: ESOPHAGOGASTRODUODENOSCOPY (EGD); Surgeon: Ladene Artist, MD; Location: [facility] ENDOSCOPY; Service: Endoscopy;  Laterality: N/A;  . Savory dilation N/A 09/25/2014  Procedure: SAVORY DILATION; Surgeon: Ladene Artist, MD;  Location: [facility] ENDOSCOPY; Service: Endoscopy; Laterality: N/A;   HPI:  Naudia L Burmaster is a 54 y.o. female presenting with vague chest  complaints of difficulty swallowing/globus sensation and left  flank/rib pain, found to have pericardial effusion on imaging.  PMH is significant for GERD, tobacco abuse, daily alcohol  consumption.Barium swallow with apparent mass at base of tongue,  not confirmed on CT.    Assessment / Plan / Recommendation Clinical Impression Dysphagia Diagnosis: Within Functional Limits Clinical impression: Patient presents with normal swallowing  function with timely oral transit, initiation of the swallow,  clearance of bolus, and full airway protection. Reviewed imaged  from barium swallow.  Abnormality documented as possible mass at  base of tongue region not seen on todays exam. Reviewed general  esophageal precautions with patient as suspect that symptoms are  primarily esophageal in nature given EGD results. Patient  verbalized understanding. No further SLP needs indicated at this  time.   Treatment Recommendation No treatment recommended at this time   Diet Recommendation Regular;Thin liquid   Liquid Administration via: Cup;Straw Medication Administration: Whole meds with liquid Supervision: Patient able to self feed Compensations: Small sips/bites;Slow rate;Follow solids with  liquid Postural Changes and/or Swallow Maneuvers: Seated upright 90  degrees;Upright 30-60 min after meal   Other Recommendations Oral Care Recommendations: Oral care BID  Follow Up Recommendations None       General Date of Onset: 09/22/14 HPI: SHADDAI SHAPLEY is a 54 y.o. female presenting with vague chest  complaints of difficulty swallowing/globus sensation and left  flank/rib pain, found to have pericardial effusion on imaging.  PMH is significant for GERD, tobacco abuse, daily alcohol  consumption.Barium swallow with apparent mass at base of tongue,  not confirmed on CT. Type of Study: Modified Barium Swallowing Study Reason for Referral: Objectively evaluate swallowing function Previous Swallow Assessment: see HPI Diet Prior to this Study: Regular;Thin liquids Temperature Spikes Noted: No Respiratory Status: Room air History of Recent Intubation: No Behavior/Cognition: Alert;Cooperative;Pleasant mood Oral Cavity - Dentition: Poor condition;Missing dentition Oral Motor / Sensory Function: Within functional limits Self-Feeding Abilities: Able to feed self Patient Positioning: Upright in chair Baseline Vocal Quality: Clear Volitional Cough: Strong Volitional Swallow: Able to elicit Anatomy: Within functional limits (see clinical impression  statement)  Pharyngeal  Secretions: Not observed secondary MBS   Reason for Referral Objectively evaluate swallowing function  Oral Phase Oral Preparation/Oral Phase Oral Phase: WFL  Pharyngeal Phase Pharyngeal Phase Pharyngeal Phase: Within functional limits  Cervical Esophageal Phase    GO   Cervical Esophageal Phase Cervical Esophageal Phase: [facility]    Functional Assessment Tool Used: skilled clinical judgement Functional Limitations: Swallowing Swallow Current Status (Y5638): 0 percent impaired, limited or  restricted Swallow Goal Status (L3734): 0 percent impaired, limited or  restricted Swallow Discharge Status [phone]): 0 percent impaired, limited or  restricted  [facility] MA, CCC-SLP [phone]  McCoy Leah [name] 09/26/2014, 9:26 AM   CT soft tissue neck with contrast on 09/24/2014: Neck W Contrast   Status: Final result       PACS Images     Show images for CT Soft Tissue Neck W Contrast     Study Result     CLINICAL DATA: Sensation of something being stuck in the throat. Chest pain during swallowing. Contrast swallow study showed a mass in the oropharynx at the level of the tongue base.  EXAM: CT NECK WITH CONTRAST  TECHNIQUE: Multidetector CT imaging of the neck was performed using the standard protocol following the bolus administration of intravenous contrast.  CONTRAST: 141m OMNIPAQUE IOHEXOL 300 MG/ML SOLN  COMPARISON: Swallowing study same day.  FINDINGS: Pharynx and larynx: No mucosal or submucosal lesion is seen. No evidence of tongue base mass.  Salivary glands: Both parotid glands are normal. Both submandibular glands are normal.  Thyroid: Symmetric and normal  Lymph nodes: No enlarged lymph nodes on either side of the neck.  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 Mild  cervical spondylosis.  Upper chest: Layering pleural effusion on the left with dependent volume loss.  IMPRESSION: There is no evidence of mucosal or submucosal mass of the tongue base. No finding to correlate with the swallowing study.   Electronically Signed  By: MNelson ChimesM.D.  On: 09/24/2014 17:34   DG esophagus 09/24/14: gus   Status: Final result       PACS Images     Show images for DG Esophagus     Study Result     CLINICAL DATA: Chronic GE reflux, recent globus sensation in the back of the throat, recent mid chest pain during swallows, recent sensation of solid food getting stuck in the throat.  EXAM: ESOPHOGRAM / BARIUM SWALLOW / BARIUM TABLET STUDY  TECHNIQUE: Combined double contrast and single contrast examination was performed using effervescent crystals, thick barium liquid, and thin barium liquid. The patient was observed with fluoroscopy swallowing a 13 mm barium sulfate tablet.  FLUOROSCOPY TIME: 2 min 24 sec, pulsed fluoroscopy.  COMPARISON: None.  FINDINGS: Patient swallowed the thick and thin barium liquid without difficulty. Primary esophageal peristalsis is well-preserved, with only slight, insignificant break up of the primary wave in the upper esophagus on a single swallow, without break up of the primary wave on subsequent swallows. No fixed esophageal strictures or masses. No evidence of hiatal hernia. Gastroesophageal reflux was not elicited with use of the water siphon maneuver.  Evaluation of the pharynx during rapid sequence imaging with swallows of thin barium liquid demonstrate a mass at the level of the base of the tongue which causes a filling defect in the swallowed barium. There is also evidence of laryngeal penetration to the level of the cords,  though there is no evidence of frank tracheal aspiration.  IMPRESSION: 1. Mass in the oropharynx at the level of the base of the tongue. CT of the neck  with contrast is recommended in further evaluation to confirm or deny this finding. 2. Laryngeal penetration of thin barium liquid to the level of the vocal cords, without evidence of frank tracheal aspiration. 3. No significant abnormality involving the esophagus.   Electronically Signed  By: Evangeline Dakin M.D.  On: 09/24/2014 14:59     IMPRESSION/PLAN: 54 year old female admitted with recurrent ill-defined left-sided chest pain. Patient is to undergo repeat echo and may need repeat TEE based on findings. Prior CT scan showed left lung disease question atelectasis. She is currently on antibiotics. She also has weight loss and has dropped her H&H since her last admission. Along with her fevers this is of concern for malignancy. Will review with Dr. Henrene Pastor as to proceeding with colonoscopy as well as CT of the abdomen and pelvis. We'll obtain fecal occult blood test.    Hvozdovic, Lori P PA-C 10/08/2014,    Addendum: Reviewed pt with Dr Gaylord Shih order CT Abd/pelvis.  Pager [phone]  GI ATTENDING  History, laboratories, prior endoscopy report, x-rays reviewed. Patient personally seen and examined. Agree with H&P as outlined above. Patient presents with recurrent chest pain. This is pleuritic in nature. She is anemic, though I think this is chronic disease or related to underlying inflammatory process (rheumatologic versus infectious). Has been no GI bleeding. However, given her anemia, weight loss, age, no prior colonoscopy, reasonable to proceed with such to complete GI workup. We will obtain abdominal pelvic CT first to rule out other entities.  Docia Chuck. Geri Seminole., M.D. [facility] Division of Gastroenterology

## 2014-10-08 NOTE — Progress Notes (Signed)
Family Medicine Teaching Service Daily Progress Note Intern Pager: [phone]  Patient name: Angela Serrano Medical record number: [id number] Date of birth: 1961/05/04 Age: 54 y.o. Gender: female  Primary Care Provider: Conni Slipper, MD Consultants: None Code Status: Full  Pt Overview and Major Events to Date:  1/17: Admitted for chest pain  Assessment and Plan: Angela Serrano is a 54 y.o. female presenting with chest discomfort and fevers. PMH is significant for recent admission to hospital for similar LUQ/flank pain, which resulted in EGD with esophageal dilatation.   # Chest pain: significant chest pain worsened by essentially any movement. Also seems pleuritic in nature. This is concerning given her history of continued fevers. She was hospitalized a few weeks ago for similar pain that was primarily in LUQ/left flank at that time. Initial CT scan had shown moderate pericardial effusion and pt thus had transthoracic echo done with EF 50-55%, trivial pericardial effusion. Met SIRS criteria on admission, but no clear source. - cycled trop x 3 neg - EKG nml - repeat transthoracic echo, if negative will likely need TTE to eval for valvular infectious process - consulted cards; appreciate recs - possible pleuropericarditis, also suggest trial of empiric colchicine - tramadol prn moderate pain, morphine prn severe pain - treating for possible infection as below  # Fever: persistent since last admission. No focal signs of infection other than chest pain, ddx outlined above. ANA neg earlier this month. HIV neg. Lactic acid improved.  - repeat sed rate 91 (50 during last admit) and CRP 14.4 (13.1 during last admit)  - f/u blood cultures x 2 - continue empiric vanc/cefepime per pharmacy -PPD and quant ordered - monitor vitals - tylenol prn fever  #Anemia: Appears to be worsening since last admission. Patient denies any active bleeding. Uncertain etiology of worsening anemia. Prior hbg were  wnl. - will trend Hbg - today 8.7 -will order anemia panel -will consult GI for scope -FOBT ordered -consider pan-scan in setting of anemia, weight loss, and elevated CRP and ESR  # Weight loss: down 17lb since July 2013. Albumin low at 2.9.  - nutrition consulted - ensure UTD on age appropriate cancer screenings: no colonoscopy visible in records. Pap normal in 2013 (HPV not tested). Normal mammo in April 2013, no repeats since then.  # Sacral rash: appearance and hx consistent with herpes zoster. As present recurrence has been active for >3 weeks, will not dose acyclovir at this time  # ETOH use: endorses near daily drinking - CIWA protocol - scores 0  FEN/GI: heart healthy diet, NS @ 125 cc/hr as appears dry on exam Prophylaxis: SQ heparin  Disposition: Continue current management as above; pending further workup.  Subjective:  Patient doing well this morning. She says her chest pain has decreased some. She still says that if she moves or breathes deeply pain occurs. No overnight events.  Objective: Temp:  [97.9 F (36.6 C)-101.3 F (38.5 C)] 98.5 F (36.9 C) (01/18 1151) Pulse Rate:  [66-120] 66 (01/18 1151) Resp:  [11-37] 26 (01/18 1151) BP: (97-149)/(63-90) 119/73 mmHg (01/18 1151) SpO2:  [92 %-100 %] 96 % (01/18 1151) Weight:  [111 lb (50.349 kg)-111 lb 12.4 oz (50.7 kg)] 111 lb 12.4 oz (50.7 kg) (01/18 0205) Physical Exam: General: NAD, pleasant, cooperative, chronically ill appearing HEENT: NCAT, slightly dry mucous membranes, temporal wasting bilaterally Cardiovascular: RRR, no murmur appreciated.  Respiratory: CTAB, comfortable work of breathing.  Abdomen: soft. TTP epigastric and LUQ with some mild involuntary guarding. Rebound  tenderness. No masses or organomegaly Extremities: No appreciable lower extremity edema bilaterally, 2+ DP pulses bilat Neuro: grossly nonfocal, speech normal, follows commands  Laboratory: Results for orders placed or performed during  the hospital encounter of 10/07/14 (from the past 24 hour(s))  CBC     Status: Abnormal   Collection Time: 10/07/14  7:26 PM  Result Value Ref Range   WBC 8.8 4.0 - 10.5 K/uL   RBC 6.02 (H) 3.87 - 5.11 MIL/uL   Hemoglobin 18.2 (H) 12.0 - 15.0 g/dL   HCT 54.5 (H) 36.0 - 46.0 %   MCV 90.5 78.0 - 100.0 fL   MCH 30.2 26.0 - 34.0 pg   MCHC 33.4 30.0 - 36.0 g/dL   RDW 12.9 11.5 - 15.5 %   Platelets 463 (H) 150 - 400 K/uL  Basic metabolic panel     Status: Abnormal   Collection Time: 10/07/14  7:26 PM  Result Value Ref Range   Sodium 137 135 - 145 mmol/L   Potassium 4.8 3.5 - 5.1 mmol/L   Chloride 100 96 - 112 mEq/L   CO2 25 19 - 32 mmol/L   Glucose, Bld 109 (H) 70 - 99 mg/dL   BUN 13 6 - 23 mg/dL   Creatinine, Ser 0.63 0.50 - 1.10 mg/dL   Calcium 9.3 8.4 - 10.5 mg/dL   GFR calc non Af Amer >90 >90 mL/min   GFR calc Af Amer >90 >90 mL/min   Anion gap 12 5 - 15  BNP (order ONLY if patient complains of dyspnea/SOB AND you have documented it for THIS visit)     Status: None   Collection Time: 10/07/14  7:26 PM  Result Value Ref Range   B Natriuretic Peptide 43.1 0.0 - 100.0 pg/mL  Troponin I     Status: None   Collection Time: 10/07/14  7:26 PM  Result Value Ref Range   Troponin I <0.03 <0.031 ng/mL  Lipase, blood     Status: None   Collection Time: 10/07/14  7:26 PM  Result Value Ref Range   Lipase 27 11 - 59 U/L  Hepatic function panel     Status: Abnormal   Collection Time: 10/07/14  9:48 PM  Result Value Ref Range   Total Protein 7.8 6.0 - 8.3 g/dL   Albumin 2.9 (L) 3.5 - 5.2 g/dL   AST 14 0 - 37 U/L   ALT 9 0 - 35 U/L   Alkaline Phosphatase 75 39 - 117 U/L   Total Bilirubin 0.4 0.3 - 1.2 mg/dL   Bilirubin, Direct <0.1 0.0 - 0.3 mg/dL   Indirect Bilirubin NOT CALCULATED 0.3 - 0.9 mg/dL  I-Stat CG4 Lactic Acid, ED     Status: None   Collection Time: 10/07/14  9:56 PM  Result Value Ref Range   Lactic Acid, Venous 1.16 0.5 - 2.2 mmol/L  Urinalysis, Routine w reflex  microscopic     Status: Abnormal   Collection Time: 10/07/14  9:58 PM  Result Value Ref Range   Color, Urine YELLOW YELLOW   APPearance CLEAR CLEAR   Specific Gravity, Urine 1.017 1.005 - 1.030   pH 5.5 5.0 - 8.0   Glucose, UA NEGATIVE NEGATIVE mg/dL   Hgb urine dipstick SMALL (A) NEGATIVE   Bilirubin Urine NEGATIVE NEGATIVE   Ketones, ur 15 (A) NEGATIVE mg/dL   Protein, ur NEGATIVE NEGATIVE mg/dL   Urobilinogen, UA 1.0 0.0 - 1.0 mg/dL   Nitrite NEGATIVE NEGATIVE   Leukocytes, UA NEGATIVE NEGATIVE  Urine microscopic-add on     Status: Abnormal   Collection Time: 10/07/14  9:58 PM  Result Value Ref Range   Squamous Epithelial / LPF FEW (A) RARE   WBC, UA 0-2 <3 WBC/hpf   RBC / HPF 3-6 <3 RBC/hpf   Bacteria, UA FEW (A) RARE   Urine-Other MUCOUS PRESENT   I-Stat CG4 Lactic Acid, ED     Status: Abnormal   Collection Time: 10/08/14 12:33 AM  Result Value Ref Range   Lactic Acid, Venous 0.38 (L) 0.5 - 2.2 mmol/L  Sedimentation rate     Status: Abnormal   Collection Time: 10/08/14  2:39 AM  Result Value Ref Range   Sed Rate 91 (H) 0 - 22 mm/hr  C-reactive protein     Status: Abnormal   Collection Time: 10/08/14  2:39 AM  Result Value Ref Range   CRP 14.4 (H) <0.60 mg/dL  Troponin I     Status: None   Collection Time: 10/08/14  2:39 AM  Result Value Ref Range   Troponin I <0.03 <0.031 ng/mL  MRSA PCR Screening     Status: None   Collection Time: 10/08/14  6:57 AM  Result Value Ref Range   MRSA by PCR NEGATIVE NEGATIVE  Troponin I     Status: None   Collection Time: 10/08/14  8:05 AM  Result Value Ref Range   Troponin I <0.03 <0.031 ng/mL  CBC     Status: Abnormal   Collection Time: 10/08/14  9:05 AM  Result Value Ref Range   WBC 10.0 4.0 - 10.5 K/uL   RBC 3.04 (L) 3.87 - 5.11 MIL/uL   Hemoglobin 8.8 (L) 12.0 - 15.0 g/dL   HCT 27.2 (L) 36.0 - 46.0 %   MCV 89.5 78.0 - 100.0 fL   MCH 28.9 26.0 - 34.0 pg   MCHC 32.4 30.0 - 36.0 g/dL   RDW 13.1 11.5 - 15.5 %   Platelets  685 (H) 150 - 400 K/uL  CBC     Status: Abnormal   Collection Time: 10/08/14 11:00 AM  Result Value Ref Range   WBC 10.2 4.0 - 10.5 K/uL   RBC 2.99 (L) 3.87 - 5.11 MIL/uL   Hemoglobin 8.7 (L) 12.0 - 15.0 g/dL   HCT 26.9 (L) 36.0 - 46.0 %   MCV 90.0 78.0 - 100.0 fL   MCH 29.1 26.0 - 34.0 pg   MCHC 32.3 30.0 - 36.0 g/dL   RDW 13.1 11.5 - 15.5 %   Platelets 731 (H) 150 - 400 K/uL    Imaging/Diagnostic Tests: Dg Chest 2 View 10/07/2014  IMPRESSION: Improved small left effusion and basilar airspace disease since the most recent examination. No new abnormality.  Cardiomegaly.     Katheren Shams, DO 10/08/2014, 3:14 PM PGY-1, [facility] Intern pager: [phone], text pages welcome

## 2014-10-08 NOTE — Progress Notes (Signed)
Report called to RN on 3W, VS stable. Pt aware of transfer.

## 2014-10-08 NOTE — Consult Note (Signed)
CARDIOLOGY CONSULT NOTE   Patient ID: DEARRA MYHAND MRN: [id number], DOB/AGE: April 16, 1961   Admit date: 10/07/2014 Date of Consult: 10/08/2014   Primary Physician: Conni Slipper, MD Primary Cardiologist: None  Pt. Profile  54 year old African-American woman readmitted with recurrent chest pain.  She had had a previous admission January 2 through 09/27/2014.  Problem List  Past Medical History  Diagnosis Date  . Seasonal allergies 2011    sneezing and hoarseness  . Poor dentition   . Acid reflux disease     Past Surgical History  Procedure Laterality Date  . Myomectomy  2004  . Tubal ligation  1990  . Esophagogastroduodenoscopy N/A 09/25/2014    Procedure: ESOPHAGOGASTRODUODENOSCOPY (EGD);  Surgeon: Ladene Artist, MD;  Location: [facility] ENDOSCOPY;  Service: Endoscopy;  Laterality: N/A;  . Savory dilation N/A 09/25/2014    Procedure: SAVORY DILATION;  Surgeon: Ladene Artist, MD;  Location: [facility] ENDOSCOPY;  Service: Endoscopy;  Laterality: N/A;     Allergies  No Known Allergies  HPI   This 54 year old African-American woman was readmitted on 10/07/14 for recurrent chest pain.  Her chest pain is of a pleuritic nature and is worse when she takes a deep breath.  It is located in the low left substernal area under the left breast.  It is painful for her to sit up in bed.  Movement in bed exacerbates the pain. On her previous hospitalization the patient had an echocardiogram on 09/23/14 which showed normal left ventricle systolic function with an ejection fraction of 50-55% and there was a trivial pericardial effusion.  A CT scan of the chest on 09/22/14 had shown a moderate pericardial effusion and small bilateral pleural effusions new since previous studies. The patient states that since her discharge earlier this month she has continued to have ongoing chest discomfort.  She has been taking Tylenol.  She has continued to have fever.  Her family indicates that she has lost about 20 pounds  in the past year and she has had a poor appetite.  Inpatient Medications  . ceFEPime (MAXIPIME) IV  2 g Intravenous Q8H  . heparin  5,000 Units Subcutaneous 3 times per day  . pantoprazole  40 mg Oral Daily  . sodium chloride  3 mL Intravenous Q12H  . vancomycin  750 mg Intravenous Q12H    Family History Family History  Problem Relation Age of Onset  . Cancer Mother 69    Bone  . Alcohol abuse Father 70  . Hypertension Sister   . Hyperlipidemia Sister   . Hypertension Sister   . Thyroid disease Daughter   . Diabetes Maternal Aunt   . Cancer Maternal Aunt 60    Bone   . Diabetes Maternal Grandmother   . Cancer Maternal Aunt 63    Bone      Social History History   Social History  . Marital Status: Single    Spouse Name: N/A    Number of Children: 3  . Years of Education: N/A   Occupational History  . [employer]    Social History Main Topics  . Smoking status: Current Every Day Smoker -- 0.25 packs/day for 30 years    Types: Cigarettes  . Smokeless tobacco: Never Used     Comment: down to 3 cigs a day  . Alcohol Use: 0.0 oz/week    0 Not specified per week     Comment: one beer per night for sleep  .  Drug Use: No  . Sexual Activity: No   Other Topics Concern  . Not on file   Social History Narrative     Review of Systems  General:  Positive for chills fever and weight loss Cardiovascular:  No  dyspnea on exertion, edema, orthopnea, palpitations, paroxysmal nocturnal dyspnea.  Positive for pleuritic chest pain Dermatological: No rash, lesions/masses Respiratory: No cough, dyspnea Urologic: No hematuria, dysuria Abdominal:   No nausea, vomiting, diarrhea, bright red blood per rectum, melena, or hematemesis Neurologic:  No visual changes, wkns, changes in mental status. All other systems reviewed and are otherwise negative except as noted above.  Physical Exam  Blood pressure 100/63, pulse 77, temperature 98.1 F  (36.7 C), temperature source Oral, resp. rate 24, height 5\' 1"  (1.549 m), weight 111 lb 12.4 oz (50.7 kg), last menstrual period 04/27/2013, SpO2 96 %.  General: Pleasant, NAD.  She is thin and appears to be chronically ill Psych: Normal affect. Neuro: Alert and oriented X 3. Moves all extremities spontaneously. HEENT: Normal  Neck: Supple without bruits or JVD. Lungs:  Resp regular and unlabored.  Bronchial breath sounds and egophony at left base. Heart: RRR no s3, s4, or murmurs.  There is a low pitched pleural friction rub audible in the left anterior precordium Abdomen: Soft, non-tender, non-distended, BS + x 4.  Extremities: No clubbing, cyanosis or edema. DP/PT/Radials 2+ and equal bilaterally.  Labs   Recent Labs  10/07/14 1926 10/08/14 0239 10/08/14 0805  TROPONINI <0.03 <0.03 <0.03   Lab Results  Component Value Date   WBC 10.0 10/08/2014   HGB 8.8* 10/08/2014   HCT 27.2* 10/08/2014   MCV 89.5 10/08/2014   PLT 685* 10/08/2014    Recent Labs Lab 10/07/14 1926 10/07/14 2148  NA 137  --   K 4.8  --   CL 100  --   CO2 25  --   BUN 13  --   CREATININE 0.63  --   CALCIUM 9.3  --   PROT  --  7.8  BILITOT  --  0.4  ALKPHOS  --  75  ALT  --  9  AST  --  14  GLUCOSE 109*  --    Lab Results  Component Value Date   CHOL 127 09/23/2014   HDL 50 09/23/2014   LDLCALC 63 09/23/2014   TRIG 72 09/23/2014   Lab Results  Component Value Date   DDIMER 3.00* 09/22/2014    Radiology/Studies  Dg Chest 2 View  10/07/2014   CLINICAL DATA:  Right chest pain and shortness of breath beginning 5 days ago.  EXAM: CHEST  2 VIEW  COMPARISON:  PA and lateral chest 09/22/2014 and 09/25/2014. CT chest 09/27/2013.  FINDINGS: Small right pleural effusion and basilar airspace disease seen on the most recent examination have improved. The right lung is clear. Mild cardiomegaly is again seen.  IMPRESSION: Improved small left effusion and basilar airspace disease since the most recent  examination. No new abnormality.  Cardiomegaly.   Electronically Signed   By: Inge Rise M.D.   On: 10/07/2014 20:35   Dg Chest 2 View  09/25/2014   CLINICAL DATA:  Fever.  Shortness of breath.  Chest pain  EXAM: CHEST  2 VIEW  COMPARISON:  09/22/2014  FINDINGS: Moderate enlargement of the cardiopericardial silhouette, cardiothoracic index 60%. Small bilateral pleural effusions. No edema. Mild atelectasis in the left lower lobe similar to recent chest CT.  IMPRESSION: 1. Moderate enlargement of the cardiopericardial  silhouette. At least some of this is attributable to the pericardial effusion, which was reported out as trivial on echocardiography but is at least moderate in volume on the recent CT scan, with distention of the superior pericardial recesses. 2. Small bilateral pleural effusions with mild atelectasis in the left lower lobe.   Electronically Signed   By: Sherryl Barters M.D.   On: 09/25/2014 10:31   Dg Chest 2 View  09/22/2014   CLINICAL DATA:  Pt states flank pain with difficulty urinating. Pt symptoms started this week. No discharge. No fever known. Pain goes to left side. No hx of kidney stones. Also c/o discomfort with swallowing. Feels like food doesn't go down. Is able to swallow but keeps feeling of "fullness". Increase in belching. Hx of reflux.  EXAM: CHEST - 2 VIEW  COMPARISON:  09/27/2013  FINDINGS: Mild cardiomegaly, new since prior exam. Linear subsegmental atelectasis or scarring in the lung bases. Lungs otherwise clear. No pneumothorax. No effusion. Degenerative changes in the right shoulder.  IMPRESSION: 1. Mild cardiomegaly, new since prior exam.   Electronically Signed   By: Arne Cleveland M.D.   On: 09/22/2014 12:47   Ct Soft Tissue Neck W Contrast  09/24/2014   CLINICAL DATA:  Sensation of something being stuck in the throat. Chest pain during swallowing. Contrast swallow study showed a mass in the oropharynx at the level of the tongue base.  EXAM: CT NECK WITH CONTRAST   TECHNIQUE: Multidetector CT imaging of the neck was performed using the standard protocol following the bolus administration of intravenous contrast.  CONTRAST:  182mL OMNIPAQUE IOHEXOL 300 MG/ML  SOLN  COMPARISON:  Swallowing study same day.  FINDINGS: Pharynx and larynx: No mucosal or submucosal lesion is seen. No evidence of tongue base mass.  Salivary glands: Both parotid glands are normal. Both submandibular glands are normal.  Thyroid: Symmetric and normal  Lymph nodes: No enlarged lymph nodes on either side of the neck.  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 Mild cervical spondylosis.  Upper chest: Layering pleural effusion on the left with dependent volume loss.  IMPRESSION: There is no evidence of mucosal or submucosal mass of the tongue base. No finding to correlate with the swallowing study.   Electronically Signed   By: Nelson Chimes M.D.   On: 09/24/2014 17:34   Ct Angio Chest Pe W/cm &/or Wo Cm  09/22/2014   CLINICAL DATA:  flank pain with difficulty urinating. Pt symptoms started this week. No discharge. No fever known. Pain goes to left side. No hx of kidney stones. Also c/o discomfort with swallowing. Feels like food doesn't go down. Is able to swallow but keeps feeling of "fullness". Increase in belching. Hx of reflux. Not taking medications for it. "not like she should" Pt said left sided back ainElevated d dimer  EXAM: CT ANGIOGRAPHY CHEST WITH CONTRAST  TECHNIQUE: Multidetector CT imaging of the chest was performed using the standard protocol during bolus administration of intravenous contrast. Multiplanar CT image reconstructions and MIPs were obtained to evaluate the vascular anatomy.  CONTRAST:  161mL OMNIPAQUE IOHEXOL 350 MG/ML SOLN  COMPARISON:  09/27/2013  FINDINGS: SVC patent. RV/LV ratio less than 1. Satisfactory opacification of pulmonary  arteries noted, and there is no evidence of pulmonary emboli. Patent superior and inferior pulmonary veins bilaterally. Adequate contrast opacification of the thoracic aorta with no evidence of dissection, aneurysm, or  stenosis. There is bovine variant brachiocephalic arch anatomy without proximal stenosis.  There is a moderate pericardial effusion , new since prior study. Trace bilateral pleural effusions. No hilar or mediastinal adenopathy. Moderate dependent atelectasis in both lower lobes left greater than right, and posteriorly in the left upper lobe. Thoracic spine and sternum intact. Visualized portions of upper abdomen unremarkable.  Review of the MIP images confirms the above findings.  IMPRESSION: 1. Negative for acute PE or thoracic aortic dissection. 2. Moderate pericardial effusion and trace bilateral pleural effusions, new since prior study.   Electronically Signed   By: Arne Cleveland M.D.   On: 09/22/2014 16:43   Dg Esophagus  09/24/2014   CLINICAL DATA:  Chronic GE reflux, recent globus sensation in the back of the throat, recent mid chest pain during swallows, recent sensation of solid food getting stuck in the throat.  EXAM: ESOPHOGRAM / BARIUM SWALLOW / BARIUM TABLET STUDY  TECHNIQUE: Combined double contrast and single contrast examination was performed using effervescent crystals, thick barium liquid, and thin barium liquid. The patient was observed with fluoroscopy swallowing a 13 mm barium sulfate tablet.  FLUOROSCOPY TIME:  2 min 24 sec, pulsed fluoroscopy.  COMPARISON:  None.  FINDINGS: Patient swallowed the thick and thin barium liquid without difficulty. Primary esophageal peristalsis is well-preserved, with only slight, insignificant break up of the primary wave in the upper esophagus on a single swallow, without break up of the primary wave on subsequent swallows. No fixed esophageal strictures or masses. No evidence of hiatal hernia. Gastroesophageal reflux was not elicited with use  of the water siphon maneuver.  Evaluation of the pharynx during rapid sequence imaging with swallows of thin barium liquid demonstrate a mass at the level of the base of the tongue which causes a filling defect in the swallowed barium. There is also evidence of laryngeal penetration to the level of the cords, though there is no evidence of frank tracheal aspiration.  IMPRESSION: 1. Mass in the oropharynx at the level of the base of the tongue. CT of the neck with contrast is recommended in further evaluation to confirm or deny this finding. 2. Laryngeal penetration of thin barium liquid to the level of the vocal cords, without evidence of frank tracheal aspiration. 3. No significant abnormality involving the esophagus.   Electronically Signed   By: Evangeline Dakin M.D.   On: 09/24/2014 14:59   Dg Abd Portable 1v  10/08/2014   CLINICAL DATA:  involuntary guarding of the abdomen.  EXAM: ABDOMEN - 1 VIEW  COMPARISON:  None.  FINDINGS: There are no dilated loops of small bowel or air-fluid levels noted. Within the left lower quadrant there are are a few normal caliber, air-filled loops of small bowel noted. Gas is seen in throughout the colon up to the rectum.  IMPRESSION: Nonobstructive bowel gas pattern.   Electronically Signed   By: Kerby Moors M.D.   On: 10/08/2014 09:19   Dg Swallowing Func-speech Pathology  09/26/2014   Earle Gell McCoy, CCC-SLP     09/26/2014  9:27 AM Objective Swallowing Evaluation: Modified Barium Swallowing Study   Patient Details  Name: MALISHA MABEY MRN: [id number] Date of Birth: 02-17-1961  Today's Date: 09/26/2014 Time: 0900-0922 SLP Time Calculation (min) (ACUTE ONLY): 22 min  Past Medical History:  Past Medical History  Diagnosis Date  . Seasonal allergies 2011    sneezing and hoarseness  . Poor dentition   . Acid reflux disease    Past Surgical History:  Past Surgical History  Procedure Laterality Date  . Myomectomy  2004  . Tubal ligation  1990  . Esophagogastroduodenoscopy N/A  09/25/2014    Procedure: ESOPHAGOGASTRODUODENOSCOPY (EGD);  Surgeon: Ladene Artist, MD;  Location: [facility] ENDOSCOPY;  Service: Endoscopy;   Laterality: N/A;  . Savory dilation N/A 09/25/2014    Procedure: SAVORY DILATION;  Surgeon: Ladene Artist, MD;   Location: [facility] ENDOSCOPY;  Service: Endoscopy;  Laterality: N/A;   HPI:  Irmgard L Plath is a 54 y.o. female presenting with vague chest  complaints of difficulty swallowing/globus sensation and left  flank/rib pain, found to have pericardial effusion on imaging.  PMH is significant for GERD, tobacco abuse, daily alcohol  consumption.Barium swallow with apparent mass at base of tongue,  not confirmed on CT.     Assessment / Plan / Recommendation Clinical Impression  Dysphagia Diagnosis: Within Functional Limits Clinical impression: Patient presents with normal swallowing  function with timely oral transit, initiation of the swallow,  clearance of bolus, and full airway protection. Reviewed imaged  from barium swallow. Abnormality documented as possible mass at  base of tongue region not seen on todays exam. Reviewed general  esophageal precautions with patient as suspect that symptoms are  primarily esophageal in nature given EGD results. Patient  verbalized understanding. No further SLP needs indicated at this  time.     Treatment Recommendation  No treatment recommended at this time    Diet Recommendation Regular;Thin liquid   Liquid Administration via: Cup;Straw Medication Administration: Whole meds with liquid Supervision: Patient able to self feed Compensations: Small sips/bites;Slow rate;Follow solids with  liquid Postural Changes and/or Swallow Maneuvers: Seated upright 90  degrees;Upright 30-60 min after meal    Other  Recommendations Oral Care Recommendations: Oral care BID   Follow Up Recommendations  None            General Date of Onset: 09/22/14 HPI: OSWIN JOHAL is a 54 y.o. female presenting with vague chest  complaints of difficulty swallowing/globus sensation and  left  flank/rib pain, found to have pericardial effusion on imaging.  PMH is significant for GERD, tobacco abuse, daily alcohol  consumption.Barium swallow with apparent mass at base of tongue,  not confirmed on CT. Type of Study: Modified Barium Swallowing Study Reason for Referral: Objectively evaluate swallowing function Previous Swallow Assessment: see HPI Diet Prior to this Study: Regular;Thin liquids Temperature Spikes Noted: No Respiratory Status: Room air History of Recent Intubation: No Behavior/Cognition: Alert;Cooperative;Pleasant mood Oral Cavity - Dentition: Poor condition;Missing dentition Oral Motor / Sensory Function: Within functional limits Self-Feeding Abilities: Able to feed self Patient Positioning: Upright in chair Baseline Vocal Quality: Clear Volitional Cough: Strong Volitional Swallow: Able to elicit Anatomy: Within functional limits (see clinical impression  statement) Pharyngeal Secretions: Not observed secondary MBS    Reason for Referral Objectively evaluate swallowing function   Oral Phase Oral Preparation/Oral Phase Oral Phase: WFL   Pharyngeal Phase Pharyngeal Phase Pharyngeal Phase: Within functional limits  Cervical Esophageal Phase    GO    Cervical Esophageal Phase Cervical Esophageal Phase: [facility]    Functional Assessment Tool Used: skilled clinical judgement Functional Limitations: Swallowing Swallow Current Status (P5916): 0 percent impaired, limited or  restricted Swallow Goal Status (B8466): 0 percent impaired, limited or  restricted Swallow Discharge Status (Z9935): 0 percent impaired, limited or  restricted   Gabriel Rainwater MA, CCC-SLP [phone]  McCoy Leah [name] 09/26/2014, 9:26 AM    Ct Renal Stone Study  09/22/2014  CLINICAL DATA:  Left flank pain.  Difficulty urinating.  EXAM: CT ABDOMEN AND PELVIS WITHOUT CONTRAST  TECHNIQUE: Multidetector CT imaging of the abdomen and pelvis was performed following the standard protocol without IV contrast.  COMPARISON:  CT scan of the  chest dated 09/27/2013  FINDINGS: There is a new a moderate pericardial effusion with a small left pleural effusion and slight bibasilar atelectasis. There is borderline cardiomegaly.  The liver appears normal. There is suggestion of sludge in the otherwise normal gallbladder. No dilated bile ducts. The spleen, pancreas, adrenal glands, and left kidney are normal. There is a 4.5 cm cyst on the lower pole the otherwise normal right kidney. There are no renal or ureteral or bladder calculi.  The bowel appears normal including the terminal ileum and appendix.  The uterus is enlarged, measuring 11 by 7.3 by 5.6 cm. The residual inhomogeneous consistent with fibroids. The ovaries are normal. Bladder is normal.  No significant osseous abnormality.  IMPRESSION: 1. Moderate pericardial effusion with slight atelectasis at both lung bases along with a small left pleural effusion. 2. Uterine fibroids. 3. No acute abnormalities of the abdomen.   Electronically Signed   By: Rozetta Nunnery M.D.   On: 09/22/2014 14:44    ECG  08-Oct-2014 06:56:39 [id number] ROUTINE RECORD Normal sinus rhythm Normal ECG Personally reviewed.  No convincing EKG evidence of pericarditis.  ASSESSMENT AND PLAN  1.  Chest pain possibly secondary to pleuropericarditis. 2.  Fever and weight loss uncertain etiology.  Marked elevation of sedimentation rate (91) and C-reactive protein, rule out polymyalgia rheumatica.  Rule out occult neoplasm. 3.  Worsening anemia uncertain etiology  Plan: Await results of repeat transthoracic echo.  If negative, consider TEE to rule out vegetations. Consider placement of PPD skin test if not already checked.  Await results of echocardiogram today and then consider empiric trial of colchicine 0.6 mg daily. Will follow  Signed, Darlin Coco, MD  10/08/2014, 11:40 AM

## 2014-10-08 NOTE — Progress Notes (Signed)
Utilization review completed.  

## 2014-10-08 NOTE — Progress Notes (Signed)
Attempted to call and receive report but no answer. Will try again in 5 minutes.

## 2014-10-09 DIAGNOSIS — R634 Abnormal weight loss: Secondary | ICD-10-CM | POA: Insufficient documentation

## 2014-10-09 DIAGNOSIS — R0789 Other chest pain: Secondary | ICD-10-CM

## 2014-10-09 DIAGNOSIS — R7982 Elevated C-reactive protein (CRP): Secondary | ICD-10-CM | POA: Insufficient documentation

## 2014-10-09 DIAGNOSIS — I309 Acute pericarditis, unspecified: Secondary | ICD-10-CM

## 2014-10-09 DIAGNOSIS — I319 Disease of pericardium, unspecified: Secondary | ICD-10-CM

## 2014-10-09 DIAGNOSIS — D6489 Other specified anemias: Secondary | ICD-10-CM

## 2014-10-09 LAB — CBC
HEMATOCRIT: 31 % — AB (ref 36.0–46.0)
HEMOGLOBIN: 10.1 g/dL — AB (ref 12.0–15.0)
MCH: 28.8 pg (ref 26.0–34.0)
MCHC: 32.6 g/dL (ref 30.0–36.0)
MCV: 88.3 fL (ref 78.0–100.0)
Platelets: 754 10*3/uL — ABNORMAL HIGH (ref 150–400)
RBC: 3.51 MIL/uL — ABNORMAL LOW (ref 3.87–5.11)
RDW: 12.9 % (ref 11.5–15.5)
WBC: 11.6 10*3/uL — AB (ref 4.0–10.5)

## 2014-10-09 LAB — IRON AND TIBC
IRON: 32 ug/dL — AB (ref 42–145)
Saturation Ratios: 17 % — ABNORMAL LOW (ref 20–55)
TIBC: 188 ug/dL — ABNORMAL LOW (ref 250–470)
UIBC: 156 ug/dL (ref 125–400)

## 2014-10-09 LAB — VITAMIN B12: VITAMIN B 12: 510 pg/mL (ref 211–911)

## 2014-10-09 LAB — FOLATE: FOLATE: 15.8 ng/mL

## 2014-10-09 LAB — FERRITIN: Ferritin: 543 ng/mL — ABNORMAL HIGH (ref 10–291)

## 2014-10-09 MED ORDER — SODIUM CHLORIDE 0.9 % IV SOLN: INTRAVENOUS | Status: DC

## 2014-10-09 MED ORDER — CEFEPIME HCL 1 G IJ SOLR
1.0000 g | Freq: Three times a day (TID) | INTRAMUSCULAR | Status: DC
  Administered 2014-10-09 – 2014-10-10 (×4): 1 g via INTRAVENOUS
  Filled 2014-10-09 (×6): qty 1

## 2014-10-09 MED ORDER — PEG-KCL-NACL-NASULF-NA ASC-C 100 G PO SOLR: 1.0000 | Freq: Once | ORAL | Status: DC

## 2014-10-09 MED ORDER — PEG-KCL-NACL-NASULF-NA ASC-C 100 G PO SOLR
0.5000 | Freq: Once | ORAL | Status: AC
  Administered 2014-10-09: 100 g via ORAL

## 2014-10-09 MED ORDER — PEG-KCL-NACL-NASULF-NA ASC-C 100 G PO SOLR
0.5000 | Freq: Once | ORAL | Status: AC
  Administered 2014-10-09: 100 g via ORAL
  Filled 2014-10-09: qty 1

## 2014-10-09 MED ORDER — COLCHICINE 0.6 MG PO TABS
0.6000 mg | ORAL_TABLET | Freq: Two times a day (BID) | ORAL | Status: DC
  Administered 2014-10-09 – 2014-10-11 (×5): 0.6 mg via ORAL
  Filled 2014-10-09 (×5): qty 1

## 2014-10-09 NOTE — Progress Notes (Signed)
        Daily Rounding Note  10/09/2014, 8:33 AM  LOS: 2 days   SUBJECTIVE:       Still with epigastric/lower sternal pain, mostly with movement, deep breathing. Last BM was yesterday. No dysphagia.   OBJECTIVE:         Vital signs in last 24 hours:    Temp:  [98.5 F (36.9 C)-98.9 F (37.2 C)] 98.6 F (37 C) (01/19 0404) Pulse Rate:  [66-90] 90 (01/19 0404) Resp:  [22-30] 22 (01/19 0404) BP: (118-122)/(71-85) 118/85 mmHg (01/19 0404) SpO2:  [94 %-96 %] 96 % (01/19 0404) Weight:  [113 lb 6.4 oz (51.438 kg)] 113 lb 6.4 oz (51.438 kg) (01/19 0404) Last BM Date: 10/05/14 Filed Weights   10/07/14 1917 10/08/14 0205 10/09/14 0404  Weight: 111 lb (50.349 kg) 111 lb 12.4 oz (50.7 kg) 113 lb 6.4 oz (51.438 kg)   General: thin, pleasant, comfortable   Heart: RRR.  No MRG Chest: tenderness in lower sternum.  RRR Abdomen: soft, ND.  No tenderness.   Extremities: no CCE.  Neuro/Psych:  Pleasant, alert, oriented x 3.  No gross deficits  Intake/Output from previous day: 01/18 0701 - 01/19 0700 In: 1700 [I.V.:1500; IV Piggyback:200] Out: 750 [Urine:750]  Intake/Output this shift:    Lab Results:  Recent Labs  10/08/14 0905 10/08/14 1100 10/09/14 0436  WBC 10.0 10.2 11.6*  HGB 8.8* 8.7* 10.1*  HCT 27.2* 26.9* 31.0*  PLT 685* 731* 754*  MCV                                         88  BMET  Recent Labs  10/07/14 1926  NA 137  K 4.8  CL 100  CO2 25  GLUCOSE 109*  BUN 13  CREATININE 0.63  CALCIUM 9.3   LFT  Recent Labs  10/07/14 2148  PROT 7.8  ALBUMIN 2.9*  AST 14  ALT 9  ALKPHOS 75  BILITOT 0.4  BILIDIR <0.1  IBILI NOT CALCULATED   Iron/TIBC/Ferritin/ %Sat    Component Value Date/Time   IRON 32* 10/08/2014 1650   TIBC 188* 10/08/2014 1650   FERRITIN 543* 10/08/2014 1650   IRONPCTSAT 17* 10/08/2014 1650   Studies/Results: Dg Chest 2 View  10/07/2014   CLINICAL DATA:  Right chest pain and  shortness of breath beginning 5 days ago.  EXAM: CHEST  2 VIEW  COMPARISON:  PA and lateral chest 09/22/2014 and 09/25/2014. CT chest 09/27/2013.  FINDINGS: Small right pleural effusion and basilar airspace disease seen on the most recent examination have improved. The right lung is clear. Mild cardiomegaly is again seen.  IMPRESSION: Improved small left effusion and basilar airspace disease since the most recent examination. No new abnormality.  Cardiomegaly.   Electronically Signed   By: Thomas  Dalessio M.D.   On: 10/07/2014 20:35   Ct Abdomen Pelvis W Contrast  10/08/2014   CLINICAL DATA:  Wt loss, anemia, upper abd pain x approx 1 month  EXAM: CT ABDOMEN AND PELVIS WITH CONTRAST  TECHNIQUE: Multidetector CT imaging of the abdomen and pelvis was performed using the standard protocol following bolus administration of intravenous contrast.  CONTRAST:  80mL OMNIPAQUE IOHEXOL 300 MG/ML  SOLN  COMPARISON:  Current abdomen radiographs.  CT, 09/22/2014.  FINDINGS: Heart is mildly enlarged. There is a small pericardial effusion as well as evidence of some pericardial thickening.   This was present on the recent prior CT.  Small bilateral pleural effusions. There is dependent atelectasis in the lower lobes more evident on the left. No pulmonary edema. Pleural effusions have increased from the prior study as has atelectasis.  Liver, spleen, gallbladder, pancreas, adrenal glands: Unremarkable. 3.9 cm lower pole right renal cyst, stable. No other renal abnormalities. No hydronephrosis. Normal ureters. Bladder is unremarkable.  Uterus is enlarged and heterogeneous consistent with multiple fibroids. No adnexal masses.  No pathologically enlarged lymph nodes.  No ascites.  No bowel wall thickening or inflammatory change. No evidence of obstruction or adynamic ileus. Normal appendix visualized.  No significant bony abnormality.  IMPRESSION: 1. Pericardial effusion and evidence of pericardial thickening. Consider pericarditis  if this correlates clinically. Mild stable cardiomegaly. 2. Small effusions, increased from the prior study. Dependent lower lobe atelectasis, left greater than right, also increased. 3. No acute findings below the diaphragm. Stable right renal cyst. Enlarged fibroid uterus.   Electronically Signed   By: David  Ormond M.D.   On: 10/08/2014 22:51   Dg Abd Portable 1v  10/08/2014   CLINICAL DATA:  involuntary guarding of the abdomen.  EXAM: ABDOMEN - 1 VIEW  COMPARISON:  None.  FINDINGS: There are no dilated loops of small bowel or air-fluid levels noted. Within the left lower quadrant there are are a few normal caliber, air-filled loops of small bowel noted. Gas is seen in throughout the colon up to the rectum.  IMPRESSION: Nonobstructive bowel gas pattern.   Electronically Signed   By: Taylor  Stroud M.D.   On: 10/08/2014 09:19   Scheduled Meds: . ceFEPime (MAXIPIME) IV  2 g Intravenous Q8H  . heparin  5,000 Units Subcutaneous 3 times per day  . pantoprazole  40 mg Oral Daily  . peg 3350 powder  1 kit Oral Once  . sodium chloride  3 mL Intravenous Q12H  . tuberculin  5 Units Intradermal Once  . vancomycin  750 mg Intravenous Q12H   Continuous Infusions: . sodium chloride 1,000 mL (10/09/14 0850)   PRN Meds:.acetaminophen, LORazepam **OR** LORazepam, morphine injection, traMADol   ASSESMENT:    *  Weight loss, anemia, resolved fever.  CT abdomen pelvis + for uterine fibroids but no pathology of digestive organs.  Erosive gastritis and empiric esoph dilatation on EGD 09/25/14. Neither EGD or CT neck confirmed mass at base of tongue seen on Ba swallow.   *  Non-cardiac chest pain.  CT chest with pericardial effusion and thickening. Diagnosed with pericarditis earlier this month.   *  Normocytic anemia.  Low iron, TIBC and sats; ferritin 543.    PLAN   *  TEE today?.  Not on schedule but pt with NPO orders and says she is having TEE .  *  Colonoscopy 9 AM tomorrow, split dose moviprep:  per Dr Slyvia Lartigue.  Will stop sq heparin tonight for this.  *  She is getting vanc and zosyn, indication is not clear to me. Fevers resolved. Are these necessary?     Sarah Gribbin  10/09/2014, 8:33 AM Pager: [phone]  GI ATTENDING  Interval history and data reviewed. Patient personally seen and examined. Agree with interval progress note as outlined. CT unrevealing. Mixed normocytic anemia. Plan for colonoscopy tomorrow.The nature of the procedure, as well as the risks, benefits, and alternatives were carefully and thoroughly reviewed with the patient. Ample time for discussion and questions allowed. The patient understood, was satisfied, and agreed to proceed.  Khiyan Crace N. Kemaria Dedic, Jr., M.D.   [facility] Division of Gastroenterology 

## 2014-10-09 NOTE — Progress Notes (Signed)
Family Medicine Teaching Service Daily Progress Note Intern Pager: [phone]  Patient name: Angela Serrano Medical record number: [id number] Date of birth: [birth year] Age: 54 y.o. Gender: female  Primary Care Provider: Conni Slipper, MD Consultants: Cardiology, Gastroenterology Code Status: Full  Pt Overview and Major Events to Date:  1/17: Admitted for chest pain  Assessment and Plan: Angela Serrano is a 54 y.o. female presenting with chest discomfort and fevers. PMH is significant for recent admission to hospital for similar LUQ/flank pain, which resulted in EGD with esophageal dilatation.   # Chest pain: significant chest pain worsened by essentially any movement. Also seems pleuritic in nature. This is concerning given her history of continued fevers. She was hospitalized a few weeks ago for similar pain that was primarily in LUQ/left flank at that time. Initial CT scan had shown moderate pericardial effusion and pt thus had transthoracic echo done with EF 50-55%, trivial pericardial effusion. Met SIRS criteria on admission, but no clear source. - cycled trop x 3 neg - EKG nl - TEE scheduled for 1/20 per cards to eval for valvular infectious process; TTE yesterday with no signs of vegetation.  - consulted cards; appreciate recs - possible pleuropericarditis, also suggest trial of empiric colchicine - tramadol prn moderate pain, morphine prn severe pain - treating for possible infection as below  # Fever: persistent since last admission. No focal signs of infection other than chest pain, ddx outlined above. ANA neg earlier this month. HIV neg. Lactic acid improved.  - repeat sed rate 91 (50 during last admit) and CRP 14.4 (13.1 during last admit)  - f/u blood cultures x 2 - ngtd - continue empiric vanc/cefepime per pharmacy; consider narrowing if no signs of endocarditis on TEE - PPD and quant ordered, placed yesterday 1/18 - monitor vitals - tylenol prn fever  #Anemia: Appears to be  worsening since last admission. Patient denies any active bleeding, no vaginal bleeding. Uncertain etiology of worsening anemia. Prior hbg were wnl. Anemia panel consistent with anemia of chronic disease, raising suspicion for malignancy. - will trend Hbg -  Improved today 10.1 <<8.7 - colonoscopy scheduled for tomorrow per GI -FOBT ordered - consider pan-scan in setting of anemia, weight loss, and elevated CRP and ESR  # Weight loss: down 17lb since July 2013. Albumin low at 2.9.  - nutrition consulted - ensure UTD on age appropriate cancer screenings: no colonoscopy visible in records. Pap normal in 2013 (HPV not tested). Normal mammo in April 2013, no repeats since then.  # Sacral rash: appearance and hx consistent with herpes zoster. As present recurrence has been active for >3 weeks, will not dose acyclovir at this time  # ETOH use: endorses near daily drinking - CIWA protocol - scores 0 since admission, will discontinue  FEN/GI: Clears today for colonoscopy, NPO after midnight for TEE, NS @ 100 cc/hr as appears dry on exam Prophylaxis: SCDs  Disposition: Pending further workup, clinical improvement.  Subjective:  Angela Serrano is breathing easier today, still with pain.  Sacral rash has improved.  Patient had one episode of light-headedness yesterday following her last blood draw.  She is refusing SQH because she does not like the needle sticks, is content wearing SCDs.  Would like to eat today, requesting apple sauce.   Objective: Temp:  [98.5 F (36.9 C)-98.9 F (37.2 C)] 98.6 F (37 C) (01/19 0404) Pulse Rate:  [66-90] 90 (01/19 0404) Resp:  [22-30] 22 (01/19 0404) BP: (118-122)/(71-85) 118/85 mmHg (01/19 0404) SpO2:  [  94 %-96 %] 96 % (01/19 0404) Weight:  [51.438 kg (113 lb 6.4 oz)] 51.438 kg (113 lb 6.4 oz) (01/19 0404) Physical Exam: General: thin woman lying slightly elevated in bed, NAD Cardiovascular: JVD, RRR, no MRG Respiratory: Bronchial breath sounds noted on L  medial mid-thoracic region.  Tenderness to palpation at mid-clavicular lin ~5th costal margin Abdomen: TTP at inconsistent locations.  Initially TTP at LLQ radiating to LUQ.  Then tender at L periumbilical area, then RUQ, all radiating to LUQ. Extremities: No edema, lesions, rashes.   Laboratory:  Recent Labs Lab 10/08/14 0905 10/08/14 1100 10/09/14 0436  WBC 10.0 10.2 11.6*  HGB 8.8* 8.7* 10.1*  HCT 27.2* 26.9* 31.0*  PLT 685* 731* 754*    Recent Labs Lab 10/07/14 1926 10/07/14 2148  NA 137  --   K 4.8  --   CL 100  --   CO2 25  --   BUN 13  --   CREATININE 0.63  --   CALCIUM 9.3  --   PROT  --  7.8  BILITOT  --  0.4  ALKPHOS  --  75  ALT  --  9  AST  --  14  GLUCOSE 109*  --    Results for Angela, Serrano (MRN [id number]) as of 10/09/2014 14:26  Ref. Range 10/08/2014 16:50  Iron Latest Range: 42-145 ug/dL 32 (L)  UIBC Latest Range: 125-400 ug/dL 156  TIBC Latest Range: 250-470 ug/dL 188 (L)  Saturation Ratios Latest Range: 20-55 % 17 (L)  Ferritin Latest Range: 10-291 ng/mL 543 (H)  Folate Latest Units: ng/mL 15.8  Vitamin B-12 Latest Range: 211-911 pg/mL 510  RBC. Latest Range: 3.87-5.11 MIL/uL 3.16 (L)  Retic Ct Pct Latest Range: 0.4-3.1 % 0.9  Retic Count, Manual Latest Range: 19.0-186.0 K/uL 28.4    Imaging/Diagnostic Tests: Echo 10/08/14:  Study Conclusions  - Left ventricle: The cavity size was normal. Wall thickness was normal. Systolic function was normal. The estimated ejection fraction was in the range of 55% to 60%. Wall motion was normal; there were no regional wall motion abnormalities. Left ventricular diastolic function parameters were normal. - Pericardium, extracardiac: A trivial pericardial effusion was identified. Features were not consistent with tamponade physiology. There was a left pleural effusion.  Impressions:  - Global longitudinal strain is normal at -18.3% There was no evidence of a vegetation.     CT Abdomen  Pelvis 10/08/14: IMPRESSION: 1. Pericardial effusion and evidence of pericardial thickening. Consider pericarditis if this correlates clinically. Mild stable cardiomegaly. 2. Small effusions, increased from the prior study. Dependent lower lobe atelectasis, left greater than right, also increased. 3. No acute findings below the diaphragm. Stable right renal cyst. Enlarged fibroid uterus.   Barbie Haggis, Med Student 10/09/2014, 8:50 AM MS4, [place] Intern pager: [phone], text pages welcome  RESIDENT ADDENDUM I have separately seen and examined the patient. I have discussed the findings and exam with the medical student and agree with the above note. Additionally I have outlined my exam and assessment/plan below:  PE: General: NAD, laying on back in bed CV: RRR, normal s1s2, no mrg. 2+ radial pulses Chest: reproducible point tenderness LLSB Resp: bronchial BS, normal effort Abd: soft, nontender, normal bowel sounds Extremities: no edema or cyanosis Neuro: alert and oriented, no focal deficits  A/P: Kambrey L Finlayson is a 54 y.o. female admitted for chest pain, fevers with uncertain etiology.  # Chest pain: reproducible on exam, feel less likely to  be cardiac/pericarditis origin and more consistent with chest wall/MSK etiology.  - appreciate cards recs - TEE planned for 1/20 - colchicine started empirically  # Fever / inflammatory state: afebrile past 2 days while on antibiotics. Possible focus of infection in lungs. Ddx still includes malignancy given weight loss, anemia, persistently elevated CRP/ESR; also includes rheumatologic dz (did have negative ANA at last admission). - will continue broad antibiotics until TEE tomorrow, then likely de-escalation to cover HCAP with levaquin - follow blood cultures - follow PPD read 1/20, quantiferon - nutrition consult for weight loss  # Anemia: improved hgb 8>10 today. Consistent with ACD, though elevated ferritin  may be due to acute phase reactant and may also be iron deficient.  - appreciate GI recs - screening colonoscopy 1/20  # FEN/GI: - clear liquid diet/bowel prep, NPO after midnight - NS overnight  Dispo: pending workup and clinical improvemtn  Tawanna Sat, MD 10/09/2014, 5:15 PM PGY-2, [place] Intern Pager: [phone], text pages welcome

## 2014-10-09 NOTE — Progress Notes (Signed)
DAILY PROGRESS NOTE  Subjective:  Continues to have chest pain. Troponin negative. BNP is low. CRP is elevated at 14.4 and ESR is 91. Echo yesterday shows trivial pericardial effusion and left pleural effusion, with normal LV systolic function.  Objective:  Temp:  [98.5 F (36.9 C)-98.9 F (37.2 C)] 98.6 F (37 C) (01/19 0404) Pulse Rate:  [66-90] 90 (01/19 0404) Resp:  [22-30] 22 (01/19 0404) BP: (118-122)/(71-85) 118/85 mmHg (01/19 0404) SpO2:  [94 %-96 %] 96 % (01/19 0404) Weight:  [113 lb 6.4 oz (51.438 kg)] 113 lb 6.4 oz (51.438 kg) (01/19 0404) Weight change: 2 lb 6.4 oz (1.089 kg)  Intake/Output from previous day: 01/18 0701 - 01/19 0700 In: 1700 [I.V.:1500; IV Piggyback:200] Out: 750 [Urine:750]  Intake/Output from this shift:    Medications: Current Facility-Administered Medications  Medication Dose Route Frequency Provider Last Rate Last Dose  . 0.9 %  sodium chloride infusion   Intravenous Continuous Vena Rua, PA-C 100 mL/hr at 10/09/14 0950 900 mL at 10/09/14 0950  . acetaminophen (TYLENOL) tablet 650 mg  650 mg Oral Q6H PRN Leeanne Rio, MD   650 mg at 10/08/14 0022  . ceFEPIme (MAXIPIME) 2 g in dextrose 5 % 50 mL IVPB  2 g Intravenous Q8H Anh P Pham, RPH   2 g at 10/09/14 0559  . heparin injection 5,000 Units  5,000 Units Subcutaneous 3 times per day Vena Rua, PA-C   Stopped at 10/08/14 1316  . LORazepam (ATIVAN) tablet 1 mg  1 mg Oral Q6H PRN Leeanne Rio, MD       Or  . LORazepam (ATIVAN) injection 1 mg  1 mg Intravenous Q6H PRN Leeanne Rio, MD      . morphine 2 MG/ML injection 2 mg  2 mg Intravenous Q2H PRN Leeanne Rio, MD   2 mg at 10/08/14 0236  . pantoprazole (PROTONIX) EC tablet 40 mg  40 mg Oral Daily Leeanne Rio, MD   40 mg at 10/09/14 1013  . peg 3350 powder (MOVIPREP) kit 100 g  0.5 kit Oral Once Lupita Dawn, MD       And  . Derrill Memo ON 10/10/2014] peg 3350 powder (MOVIPREP) kit 100 g  0.5 kit  Oral Once Lupita Dawn, MD      . sodium chloride 0.9 % injection 3 mL  3 mL Intravenous Q12H Leeanne Rio, MD   Stopped at 10/09/14 1000  . traMADol (ULTRAM) tablet 50 mg  50 mg Oral Q6H PRN Leeanne Rio, MD   50 mg at 10/08/14 2315  . tuberculin injection 5 Units  5 Units Intradermal Once Katheren Shams, DO   5 Units at 10/08/14 2149  . vancomycin (VANCOCIN) IVPB 750 mg/150 ml premix  750 mg Intravenous Q12H Anh P Pham, RPH   750 mg at 10/09/14 1013    Physical Exam: General appearance: alert and no distress Lungs: clear to auscultation bilaterally Heart: regular rate and rhythm, S1, S2 normal, no murmur, click, rub or gallop Abdomen: soft, non-tender; bowel sounds normal; no masses,  no organomegaly  Lab Results: Results for orders placed or performed during the hospital encounter of 10/07/14 (from the past 48 hour(s))  CBC     Status: Abnormal   Collection Time: 10/07/14  7:26 PM  Result Value Ref Range   WBC 8.8 4.0 - 10.5 K/uL   RBC 6.02 (H) 3.87 - 5.11 MIL/uL   Hemoglobin 18.2 (H) 12.0 -  15.0 g/dL   HCT 54.5 (H) 36.0 - 46.0 %   MCV 90.5 78.0 - 100.0 fL   MCH 30.2 26.0 - 34.0 pg   MCHC 33.4 30.0 - 36.0 g/dL   RDW 12.9 11.5 - 15.5 %   Platelets 463 (H) 150 - 400 K/uL  Basic metabolic panel     Status: Abnormal   Collection Time: 10/07/14  7:26 PM  Result Value Ref Range   Sodium 137 135 - 145 mmol/L    Comment: Please note change in reference range.   Potassium 4.8 3.5 - 5.1 mmol/L    Comment: Please note change in reference range.   Chloride 100 96 - 112 mEq/L   CO2 25 19 - 32 mmol/L   Glucose, Bld 109 (H) 70 - 99 mg/dL   BUN 13 6 - 23 mg/dL   Creatinine, Ser 0.63 0.50 - 1.10 mg/dL   Calcium 9.3 8.4 - 10.5 mg/dL   GFR calc non Af Amer >90 >90 mL/min   GFR calc Af Amer >90 >90 mL/min    Comment: (NOTE) The eGFR has been calculated using the CKD EPI equation. This calculation has not been validated in all clinical situations. eGFR's persistently <90  mL/min signify possible Chronic Kidney Disease.    Anion gap 12 5 - 15  BNP (order ONLY if patient complains of dyspnea/SOB AND you have documented it for THIS visit)     Status: None   Collection Time: 10/07/14  7:26 PM  Result Value Ref Range   B Natriuretic Peptide 43.1 0.0 - 100.0 pg/mL    Comment: Please note change in reference range.  Troponin I     Status: None   Collection Time: 10/07/14  7:26 PM  Result Value Ref Range   Troponin I <0.03 <0.031 ng/mL    Comment:        NO INDICATION OF MYOCARDIAL INJURY. Please note change in reference range.   Lipase, blood     Status: None   Collection Time: 10/07/14  7:26 PM  Result Value Ref Range   Lipase 27 11 - 59 U/L  Blood Culture (routine x 2)     Status: None (Preliminary result)   Collection Time: 10/07/14  9:37 PM  Result Value Ref Range   Specimen Description BLOOD LEFT ANTECUBITAL    Special Requests      BOTTLES DRAWN AEROBIC AND ANAEROBIC 5CC AEROBIC 3CC ANAEROBIC   Culture             BLOOD CULTURE RECEIVED NO GROWTH TO DATE CULTURE WILL BE HELD FOR 5 DAYS BEFORE ISSUING A FINAL NEGATIVE REPORT Performed at [facility]    Report Status PENDING   Blood Culture (routine x 2)     Status: None (Preliminary result)   Collection Time: 10/07/14  9:44 PM  Result Value Ref Range   Specimen Description BLOOD RIGHT HAND    Special Requests BOTTLES DRAWN AEROBIC ONLY 5CC    Culture             BLOOD CULTURE RECEIVED NO GROWTH TO DATE CULTURE WILL BE HELD FOR 5 DAYS BEFORE ISSUING A FINAL NEGATIVE REPORT Performed at [facility]    Report Status PENDING   Hepatic function panel     Status: Abnormal   Collection Time: 10/07/14  9:48 PM  Result Value Ref Range   Total Protein 7.8 6.0 - 8.3 g/dL   Albumin 2.9 (L) 3.5 - 5.2 g/dL   AST 14  0 - 37 U/L   ALT 9 0 - 35 U/L   Alkaline Phosphatase 75 39 - 117 U/L   Total Bilirubin 0.4 0.3 - 1.2 mg/dL   Bilirubin, Direct <0.1 0.0 - 0.3 mg/dL   Indirect  Bilirubin NOT CALCULATED 0.3 - 0.9 mg/dL  I-Stat CG4 Lactic Acid, ED     Status: None   Collection Time: 10/07/14  9:56 PM  Result Value Ref Range   Lactic Acid, Venous 1.16 0.5 - 2.2 mmol/L  Urinalysis, Routine w reflex microscopic     Status: Abnormal   Collection Time: 10/07/14  9:58 PM  Result Value Ref Range   Color, Urine YELLOW YELLOW   APPearance CLEAR CLEAR   Specific Gravity, Urine 1.017 1.005 - 1.030   pH 5.5 5.0 - 8.0   Glucose, UA NEGATIVE NEGATIVE mg/dL   Hgb urine dipstick SMALL (A) NEGATIVE   Bilirubin Urine NEGATIVE NEGATIVE   Ketones, ur 15 (A) NEGATIVE mg/dL   Protein, ur NEGATIVE NEGATIVE mg/dL   Urobilinogen, UA 1.0 0.0 - 1.0 mg/dL   Nitrite NEGATIVE NEGATIVE   Leukocytes, UA NEGATIVE NEGATIVE  Urine culture     Status: None   Collection Time: 10/07/14  9:58 PM  Result Value Ref Range   Specimen Description URINE, RANDOM    Special Requests NONE    Colony Count      50,000 COLONIES/ML Performed at [facility]    Culture      Multiple bacterial morphotypes present, none predominant. Suggest appropriate recollection if clinically indicated. Performed at [facility]    Report Status 10/08/2014 FINAL   Urine microscopic-add on     Status: Abnormal   Collection Time: 10/07/14  9:58 PM  Result Value Ref Range   Squamous Epithelial / LPF FEW (A) RARE   WBC, UA 0-2 <3 WBC/hpf   RBC / HPF 3-6 <3 RBC/hpf   Bacteria, UA FEW (A) RARE   Urine-Other MUCOUS PRESENT   I-Stat CG4 Lactic Acid, ED     Status: Abnormal   Collection Time: 10/08/14 12:33 AM  Result Value Ref Range   Lactic Acid, Venous 0.38 (L) 0.5 - 2.2 mmol/L  Sedimentation rate     Status: Abnormal   Collection Time: 10/08/14  2:39 AM  Result Value Ref Range   Sed Rate 91 (H) 0 - 22 mm/hr  C-reactive protein     Status: Abnormal   Collection Time: 10/08/14  2:39 AM  Result Value Ref Range   CRP 14.4 (H) <0.60 mg/dL    Comment: Performed at [facility]  Troponin I      Status: None   Collection Time: 10/08/14  2:39 AM  Result Value Ref Range   Troponin I <0.03 <0.031 ng/mL    Comment:        NO INDICATION OF MYOCARDIAL INJURY. Please note change in reference range.   MRSA PCR Screening     Status: None   Collection Time: 10/08/14  6:57 AM  Result Value Ref Range   MRSA by PCR NEGATIVE NEGATIVE    Comment:        The GeneXpert MRSA Assay (FDA approved for NASAL specimens only), is one component of a comprehensive MRSA colonization surveillance program. It is not intended to diagnose MRSA infection nor to guide or monitor treatment for MRSA infections.   Troponin I     Status: None   Collection Time: 10/08/14  8:05 AM  Result Value Ref Range   Troponin  I <0.03 <0.031 ng/mL    Comment:        NO INDICATION OF MYOCARDIAL INJURY. Please note change in reference range.   CBC     Status: Abnormal   Collection Time: 10/08/14  9:05 AM  Result Value Ref Range   WBC 10.0 4.0 - 10.5 K/uL   RBC 3.04 (L) 3.87 - 5.11 MIL/uL   Hemoglobin 8.8 (L) 12.0 - 15.0 g/dL    Comment: DELTA CHECK NOTED REPEATED TO VERIFY    HCT 27.2 (L) 36.0 - 46.0 %   MCV 89.5 78.0 - 100.0 fL   MCH 28.9 26.0 - 34.0 pg   MCHC 32.4 30.0 - 36.0 g/dL   RDW 13.1 11.5 - 15.5 %   Platelets 685 (H) 150 - 400 K/uL    Comment: DELTA CHECK NOTED  CBC     Status: Abnormal   Collection Time: 10/08/14 11:00 AM  Result Value Ref Range   WBC 10.2 4.0 - 10.5 K/uL   RBC 2.99 (L) 3.87 - 5.11 MIL/uL   Hemoglobin 8.7 (L) 12.0 - 15.0 g/dL   HCT 26.9 (L) 36.0 - 46.0 %   MCV 90.0 78.0 - 100.0 fL   MCH 29.1 26.0 - 34.0 pg   MCHC 32.3 30.0 - 36.0 g/dL   RDW 13.1 11.5 - 15.5 %   Platelets 731 (H) 150 - 400 K/uL  Troponin I     Status: None   Collection Time: 10/08/14  2:38 PM  Result Value Ref Range   Troponin I <0.03 <0.031 ng/mL    Comment:        NO INDICATION OF MYOCARDIAL INJURY. Please note change in reference range.   Vitamin B12     Status: None   Collection Time:  10/08/14  4:50 PM  Result Value Ref Range   Vitamin B-12 510 211 - 911 pg/mL    Comment: Performed at [facility]  Folate     Status: None   Collection Time: 10/08/14  4:50 PM  Result Value Ref Range   Folate 15.8 ng/mL    Comment: (NOTE) Reference Ranges        Deficient:       0.4 - 3.3 ng/mL        Indeterminate:   3.4 - 5.4 ng/mL        Normal:              > 5.4 ng/mL Performed at [facility]   Iron and TIBC     Status: Abnormal   Collection Time: 10/08/14  4:50 PM  Result Value Ref Range   Iron 32 (L) 42 - 145 ug/dL   TIBC 188 (L) 250 - 470 ug/dL   Saturation Ratios 17 (L) 20 - 55 %   UIBC 156 125 - 400 ug/dL    Comment: Performed at [facility]  Ferritin     Status: Abnormal   Collection Time: 10/08/14  4:50 PM  Result Value Ref Range   Ferritin 543 (H) 10 - 291 ng/mL    Comment: Performed at [facility]  Reticulocytes     Status: Abnormal   Collection Time: 10/08/14  4:50 PM  Result Value Ref Range   Retic Ct Pct 0.9 0.4 - 3.1 %   RBC. 3.16 (L) 3.87 - 5.11 MIL/uL   Retic Count, Manual 28.4 19.0 - 186.0 K/uL  CBC     Status: Abnormal   Collection Time: 10/09/14  4:36 AM  Result Value Ref Range   WBC 11.6 (H) 4.0 - 10.5 K/uL   RBC 3.51 (L) 3.87 - 5.11 MIL/uL   Hemoglobin 10.1 (L) 12.0 - 15.0 g/dL   HCT 31.0 (L) 36.0 - 46.0 %   MCV 88.3 78.0 - 100.0 fL   MCH 28.8 26.0 - 34.0 pg   MCHC 32.6 30.0 - 36.0 g/dL   RDW 12.9 11.5 - 15.5 %   Platelets 754 (H) 150 - 400 K/uL    Imaging: Dg Chest 2 View  10/07/2014   CLINICAL DATA:  Right chest pain and shortness of breath beginning 5 days ago.  EXAM: CHEST  2 VIEW  COMPARISON:  PA and lateral chest 09/22/2014 and 09/25/2014. CT chest 09/27/2013.  FINDINGS: Small right pleural effusion and basilar airspace disease seen on the most recent examination have improved. The right lung is clear. Mild cardiomegaly is again seen.  IMPRESSION: Improved small left effusion and basilar airspace  disease since the most recent examination. No new abnormality.  Cardiomegaly.   Electronically Signed   By: Inge Rise M.D.   On: 10/07/2014 20:35   Ct Abdomen Pelvis W Contrast  10/08/2014   CLINICAL DATA:  Wt loss, anemia, upper abd pain x approx 1 month  EXAM: CT ABDOMEN AND PELVIS WITH CONTRAST  TECHNIQUE: Multidetector CT imaging of the abdomen and pelvis was performed using the standard protocol following bolus administration of intravenous contrast.  CONTRAST:  23mL OMNIPAQUE IOHEXOL 300 MG/ML  SOLN  COMPARISON:  Current abdomen radiographs.  CT, 09/22/2014.  FINDINGS: Heart is mildly enlarged. There is a small pericardial effusion as well as evidence of some pericardial thickening. This was present on the recent prior CT.  Small bilateral pleural effusions. There is dependent atelectasis in the lower lobes more evident on the left. No pulmonary edema. Pleural effusions have increased from the prior study as has atelectasis.  Liver, spleen, gallbladder, pancreas, adrenal glands: Unremarkable. 3.9 cm lower pole right renal cyst, stable. No other renal abnormalities. No hydronephrosis. Normal ureters. Bladder is unremarkable.  Uterus is enlarged and heterogeneous consistent with multiple fibroids. No adnexal masses.  No pathologically enlarged lymph nodes.  No ascites.  No bowel wall thickening or inflammatory change. No evidence of obstruction or adynamic ileus. Normal appendix visualized.  No significant bony abnormality.  IMPRESSION: 1. Pericardial effusion and evidence of pericardial thickening. Consider pericarditis if this correlates clinically. Mild stable cardiomegaly. 2. Small effusions, increased from the prior study. Dependent lower lobe atelectasis, left greater than right, also increased. 3. No acute findings below the diaphragm. Stable right renal cyst. Enlarged fibroid uterus.   Electronically Signed   By: Lajean Manes M.D.   On: 10/08/2014 22:51   Dg Abd Portable 1v  10/08/2014    CLINICAL DATA:  involuntary guarding of the abdomen.  EXAM: ABDOMEN - 1 VIEW  COMPARISON:  None.  FINDINGS: There are no dilated loops of small bowel or air-fluid levels noted. Within the left lower quadrant there are are a few normal caliber, air-filled loops of small bowel noted. Gas is seen in throughout the colon up to the rectum.  IMPRESSION: Nonobstructive bowel gas pattern.   Electronically Signed   By: Kerby Moors M.D.   On: 10/08/2014 09:19    Assessment:  Active Problems:   Chest pain   Pericardial effusion   Sepsis   Acute pericarditis   Plan:  1. Plan trial of colchicine 0.6 mg BID starting today. Ok to feed today - will try  to schedule TEE along with colonoscopy tomorrow. She recently had esophageal dilatation, therefore, should not have issues passing TEE probe. Wish to evaluate for endocarditis, however, suspect this is unlikely.   Time Spent Directly with Patient:  15 minutes  Length of Stay:  LOS: 2 days   Pixie Casino, MD, [facility] [place] Attending Cardiologist [facility]  HILTY,Kenneth C 10/09/2014, 10:44 AM

## 2014-10-09 NOTE — Progress Notes (Addendum)
Plan for TEE to r/o endocarditis during tomorrow morning's colonoscopy tomorrow. Dr. Johnsie Cancel will do, orders placed. I will consent patient later.  [name] PA Pager: [phone]        Angela Serrano has been requested to perform a transesophageal echocardiogram on 10/10/2014 to rule out endocarditis.  After careful review of history and examination, the risks and benefits of transesophageal echocardiogram have been explained including risks of esophageal damage, perforation (1:10,000 risk), bleeding, pharyngeal hematoma as well as other potential complications associated with conscious sedation including aspiration, arrhythmia, respiratory failure and death. Alternatives to treatment were discussed, questions were answered. Patient is willing to proceed.   I have discussed risk and benefit with patient, who display clear understanding and agree to proceed with the procedure tomorrow.  [name], PA-C 10/09/2014 2:25 PM

## 2014-10-09 NOTE — Progress Notes (Signed)
ANTIBIOTIC CONSULT NOTE - FOLLOW UP  Pharmacy Consult for vancomycin and cefepime Indication: fever, r/o HCAP, r/o endocarditis  No Known Allergies  Patient Measurements: Height: 5\' 1"  (154.9 cm) Weight: 113 lb 6.4 oz (51.438 kg) IBW/kg (Calculated) : 47.8  Vital Signs: Temp: 98.6 F (37 C) (01/19 0404) Temp Source: Oral (01/19 0404) BP: 118/85 mmHg (01/19 0404) Pulse Rate: 90 (01/19 0404) Intake/Output from previous day: 01/18 0701 - 01/19 0700 In: 1700 [I.V.:1500; IV Piggyback:200] Out: 750 [Urine:750] Intake/Output from this shift:    Labs:  Recent Labs  10/07/14 1926 10/08/14 0905 10/08/14 1100 10/09/14 0436  WBC 8.8 10.0 10.2 11.6*  HGB 18.2* 8.8* 8.7* 10.1*  PLT 463* 685* 731* 754*  CREATININE 0.63  --   --   --    Estimated Creatinine Clearance: 62.8 mL/min (by C-G formula based on Cr of 0.63). No results for input(s): VANCOTROUGH, VANCOPEAK, VANCORANDOM, GENTTROUGH, GENTPEAK, GENTRANDOM, TOBRATROUGH, TOBRAPEAK, TOBRARND, AMIKACINPEAK, AMIKACINTROU, AMIKACIN in the last 72 hours.   Microbiology: Recent Results (from the past 720 hour(s))  Blood Culture (routine x 2)     Status: None (Preliminary result)   Collection Time: 10/07/14  9:37 PM  Result Value Ref Range Status   Specimen Description BLOOD LEFT ANTECUBITAL  Final   Special Requests   Final    BOTTLES DRAWN AEROBIC AND ANAEROBIC 5CC AEROBIC 3CC ANAEROBIC   Culture   Final           BLOOD CULTURE RECEIVED NO GROWTH TO DATE CULTURE WILL BE HELD FOR 5 DAYS BEFORE ISSUING A FINAL NEGATIVE REPORT Performed at [facility]    Report Status PENDING  Incomplete  Blood Culture (routine x 2)     Status: None (Preliminary result)   Collection Time: 10/07/14  9:44 PM  Result Value Ref Range Status   Specimen Description BLOOD RIGHT HAND  Final   Special Requests BOTTLES DRAWN AEROBIC ONLY 5CC  Final   Culture   Final           BLOOD CULTURE RECEIVED NO GROWTH TO DATE CULTURE WILL BE HELD FOR 5  DAYS BEFORE ISSUING A FINAL NEGATIVE REPORT Performed at [facility]    Report Status PENDING  Incomplete  Urine culture     Status: None   Collection Time: 10/07/14  9:58 PM  Result Value Ref Range Status   Specimen Description URINE, RANDOM  Final   Special Requests NONE  Final   Colony Count   Final    50,000 COLONIES/ML Performed at [facility]    Culture   Final    Multiple bacterial morphotypes present, none predominant. Suggest appropriate recollection if clinically indicated. Performed at [facility]    Report Status 10/08/2014 FINAL  Final  MRSA PCR Screening     Status: None   Collection Time: 10/08/14  6:57 AM  Result Value Ref Range Status   MRSA by PCR NEGATIVE NEGATIVE Final    Comment:        The GeneXpert MRSA Assay (FDA approved for NASAL specimens only), is one component of a comprehensive MRSA colonization surveillance program. It is not intended to diagnose MRSA infection nor to guide or monitor treatment for MRSA infections.     Anti-infectives    Start     Dose/Rate Route Frequency Ordered Stop   10/09/14 1400  ceFEPIme (MAXIPIME) 1 g in dextrose 5 % 50 mL IVPB     1 g100 mL/hr over 30 Minutes Intravenous Every  8 hours 10/09/14 1130     10/08/14 1000  vancomycin (VANCOCIN) IVPB 750 mg/150 ml premix     750 mg150 mL/hr over 60 Minutes Intravenous Every 12 hours 10/07/14 2144     10/08/14 0600  ceFEPIme (MAXIPIME) 2 g in dextrose 5 % 50 mL IVPB  Status:  Discontinued     2 g100 mL/hr over 30 Minutes Intravenous Every 8 hours 10/07/14 2144 10/09/14 1130   10/07/14 2130  ceFEPIme (MAXIPIME) 2 g in dextrose 5 % 50 mL IVPB     2 g100 mL/hr over 30 Minutes Intravenous  Once 10/07/14 2128 10/07/14 2235   10/07/14 2130  vancomycin (VANCOCIN) IVPB 1000 mg/200 mL premix     1,000 mg200 mL/hr over 60 Minutes Intravenous  Once 10/07/14 2128 10/07/14 2347      Assessment: 54 y/o female on day 3 vancomycin and cefepime for fever,  r/o HCAP, r/o endocarditis. She is afebrile and WBC are up slightly to 11.6. Echo mentions no vegetation, TEE planned for tomorrow. CXR/CT abd show pleural effusions. Culture data is negative  thus far. Renal function is normal.  Vanc 1/17>> Cefepime 1/17>>  1/17 BCx2 - ngtd 1/17 UCx - mult bacteria, none pred  Goal of Therapy:  Vancomycin trough level 15-20 mcg/ml  Plan:  - Continue vancomycin 750 mg IV q12h - Decrease cefepime to 1 g IV q8h - Monitor renal function and clinical progress - Consider narrowing antibiotic to Levaquin if pulmonary source suspected  [facility], [place].D., BCPS Clinical Pharmacist Pager: [phone] 10/09/2014 1:06 PM

## 2014-10-10 ENCOUNTER — Encounter (HOSPITAL_COMMUNITY): Payer: Self-pay | Admitting: Anesthesiology

## 2014-10-10 ENCOUNTER — Inpatient Hospital Stay (HOSPITAL_COMMUNITY): Payer: 59 | Admitting: Anesthesiology

## 2014-10-10 ENCOUNTER — Inpatient Hospital Stay (HOSPITAL_COMMUNITY): Payer: Self-pay | Admitting: Anesthesiology

## 2014-10-10 ENCOUNTER — Encounter (HOSPITAL_COMMUNITY)
Admission: EM | Disposition: A | Discharge status: Home / Self Care | Payer: Self-pay | Source: Home / Self Care | Attending: Family Medicine

## 2014-10-10 DIAGNOSIS — I339 Acute and subacute endocarditis, unspecified: Secondary | ICD-10-CM

## 2014-10-10 DIAGNOSIS — Z1211 Encounter for screening for malignant neoplasm of colon: Secondary | ICD-10-CM | POA: Insufficient documentation

## 2014-10-10 DIAGNOSIS — R7982 Elevated C-reactive protein (CRP): Secondary | ICD-10-CM

## 2014-10-10 HISTORY — PX: TEE WITHOUT CARDIOVERSION: SHX5443

## 2014-10-10 HISTORY — PX: COLONOSCOPY: SHX5424

## 2014-10-10 LAB — CK: Total CK: 51 U/L (ref 7–177)

## 2014-10-10 LAB — SAVE SMEAR

## 2014-10-10 LAB — CBC
HEMATOCRIT: 32.8 % — AB (ref 36.0–46.0)
HEMOGLOBIN: 10.7 g/dL — AB (ref 12.0–15.0)
MCH: 28.8 pg (ref 26.0–34.0)
MCHC: 32.6 g/dL (ref 30.0–36.0)
MCV: 88.2 fL (ref 78.0–100.0)
Platelets: 807 10*3/uL — ABNORMAL HIGH (ref 150–400)
RBC: 3.72 MIL/uL — AB (ref 3.87–5.11)
RDW: 12.9 % (ref 11.5–15.5)
WBC: 9.8 10*3/uL (ref 4.0–10.5)

## 2014-10-10 LAB — QUANTIFERON IN TUBE
QFT TB AG MINUS NIL VALUE: 0 [IU]/mL
QUANTIFERON MITOGEN VALUE: 0.17 IU/mL
QUANTIFERON TB AG VALUE: 0.03 [IU]/mL
QUANTIFERON TB GOLD: UNDETERMINED
Quantiferon Nil Value: 0.03 IU/mL

## 2014-10-10 LAB — QUANTIFERON TB GOLD ASSAY (BLOOD)

## 2014-10-10 SURGERY — COLONOSCOPY
Anesthesia: Monitor Anesthesia Care

## 2014-10-10 MED ORDER — PROPOFOL INFUSION 10 MG/ML OPTIME
INTRAVENOUS | Status: DC | PRN
  Administered 2014-10-10: 120 ug/kg/min via INTRAVENOUS

## 2014-10-10 MED ORDER — LACTATED RINGERS IV SOLN
INTRAVENOUS | Status: DC | PRN
  Administered 2014-10-10: 09:00:00 via INTRAVENOUS

## 2014-10-10 MED ORDER — PROPOFOL 10 MG/ML IV BOLUS
INTRAVENOUS | Status: DC | PRN
  Administered 2014-10-10: 30 mg via INTRAVENOUS

## 2014-10-10 MED ORDER — LEVOFLOXACIN 750 MG PO TABS
750.0000 mg | ORAL_TABLET | Freq: Every day | ORAL | Status: DC
Start: 2014-10-10 — End: 2014-10-11
  Administered 2014-10-11: 750 mg via ORAL
  Filled 2014-10-10: qty 1

## 2014-10-10 NOTE — Progress Notes (Signed)
Resident Daily Progress Note (medical student note cosigned by Dr. Mingo Amber prior to my addendum).  S: [name] states she is feeling much better. Has tolerated PO intake of solid food after her endoscopy. Pain greatly improved.  O: BP 130/73 mmHg  Pulse 74  Temp(Src) 98.4 F (36.9 C) (Oral)  Resp 16  Ht $R'5\' 1"'aV$  (1.549 m)  Wt 109 lb 11.2 oz (49.76 kg)  BMI 20.74 kg/m2  SpO2 100%  LMP 04/27/2013  Gen: NAD, pleasant, lying in bed, sits up with ease HEENT: NCAT, poor dentition, temporal wasting present Heart: RRR, no murmurs Lungs: CTAB with still some decr BS LLL Abd: soft, completely NTTP. No masses or organomegaly MSK: chest wall completely NTTP Neuro: grossly nonfocal, speech normal  A/P:  Angela Serrano is a 54 y.o. female admitted for chest pain, fevers with uncertain etiology.  # L sided chest pain/LUQ pain: ddx includes pericarditis, LLL CAP, MSK etiology. TEE negative today for any vegetations. Multiple reasons to have improved (treatment of PNA with abx, colchicine for pericarditis).  - appreciate cards recs - colchicine started empirically 1/19 with improvement - anticipate possible d/c tomorrow if remains afebrile after switching to PO abx  # Fever / inflammatory state: afebrile since admission while on antibiotics. Possible focus of infection in LLL. Ddx still includes malignancy given weight loss, anemia, persistently elevated CRP/ESR; also includes rheumatologic dz (however did have negative ANA at last admission). - as TEE negative for any vegetations, change to PO levaquin today - continue to follow blood cultures - follow PPD read tonight, quantiferon - nutrition consult for weight loss - brief rheumatologic lab workup (drawn here in hosp as pt has no insurance and likely will not be able to f/u with rheum electively outpt) - f/u anti-DS DNA, RF, SPEP, CK. Further workup to be done outpt.  # Anemia: improved hgb 8>10>10.7 today. Consistent with AOCD, though elevated  ferritin may be due to acute phase reactant and may also be iron deficient.  - GI performed colonoscopy today, normal, signed off - reassuring findings, f/u CBC as outpt  # Thrombocytosis: plt uptrending, 807 today. Possibly acute phase reactant - check peripheral smear  # FEN/GI: - ADAT, SLIV  Dispo: anticipate likely d/c home tomorrow  Leeanne Rio, MD  10/10/2014 6:33 PM   PGY-3, [facility] Intern Pager: [phone], text pages welcome

## 2014-10-10 NOTE — Anesthesia Procedure Notes (Signed)
Procedure Name: MAC Date/Time: 10/10/2014 8:55 AM Performed by: Izora Gala Pre-anesthesia Checklist: Patient identified, Emergency Drugs available, Suction available, Patient being monitored and Timeout performed Patient Re-evaluated:Patient Re-evaluated prior to inductionOxygen Delivery Method: Simple face mask Preoxygenation: Pre-oxygenation with 100% oxygen Placement Confirmation: positive ETCO2

## 2014-10-10 NOTE — Op Note (Signed)
[facility] [place], [address]   COLONOSCOPY PROCEDURE REPORT  PATIENT: Leylah, Tarnow  MR#: [id number] BIRTHDATE: 07/21/1961 , 53  yrs. old GENDER: female ENDOSCOPIST: Eustace Quail, MD REFERRED [id number] PROCEDURE DATE:  10/10/2014 PROCEDURE:   Colonoscopy, screening First Screening Colonoscopy - Avg.  risk and is 50 yrs.  old or older - No.  Prior Negative Screening - Now for repeat screening. N/A  History of Adenoma - Now for follow-up colonoscopy & has been > or = to 3 yrs.  N/A  Polyps Removed Today? No.  Recommend repeat exam, <10 yrs? No. ASA CLASS:   Class II INDICATIONS:anemia, non-specific and average risk for colorectal cancer. MEDICATIONS: Monitored anesthesia care and Per Anesthesia  DESCRIPTION OF PROCEDURE:   After the risks benefits and alternatives of the procedure were thoroughly explained, informed consent was obtained.  The digital rectal exam revealed no abnormalities of the rectum.   The Pentax Adult Colon [id number] endoscope was introduced through the anus and advanced to the cecum, which was identified by both the appendix and ileocecal valve. No adverse events experienced.   The quality of the prep was excellent, using MoviPrep  The instrument was then slowly withdrawn as the colon was fully examined.     COLON FINDINGS: A normal appearing cecum, ileocecal valve, and appendiceal orifice were identified.  The ascending, transverse, descending, sigmoid colon, and rectum appeared unremarkable. Retroflexed views revealed internal hemorrhoids. The time to cecum=3 minutes 0 seconds.  Withdrawal time=5 minutes 0 seconds. The scope was withdrawn and the procedure completed. COMPLICATIONS: There were no immediate complications.  ENDOSCOPIC IMPRESSION: 1. Normal colonoscopy  RECOMMENDATIONS: 1. Continue current colorectal screening recommendations for "routine risk" patients with a repeat colonoscopy in  10 years. 2. Return to the care of primary service. Will sign off.  eSigned:  Eustace Quail, MD 10/10/2014 9:16 AM   cc: Lucio Edward, MD and The Patient

## 2014-10-10 NOTE — H&P (View-Only) (Signed)
Daily Rounding Note  10/09/2014, 8:33 AM  LOS: 2 days   SUBJECTIVE:       Still with epigastric/lower sternal pain, mostly with movement, deep breathing. Last BM was yesterday. No dysphagia.   OBJECTIVE:         Vital signs in last 24 hours:    Temp:  [98.5 F (36.9 C)-98.9 F (37.2 C)] 98.6 F (37 C) (01/19 0404) Pulse Rate:  [66-90] 90 (01/19 0404) Resp:  [22-30] 22 (01/19 0404) BP: (118-122)/(71-85) 118/85 mmHg (01/19 0404) SpO2:  [94 %-96 %] 96 % (01/19 0404) Weight:  [113 lb 6.4 oz (51.438 kg)] 113 lb 6.4 oz (51.438 kg) (01/19 0404) Last BM Date: 10/05/14 Filed Weights   10/07/14 1917 10/08/14 0205 10/09/14 0404  Weight: 111 lb (50.349 kg) 111 lb 12.4 oz (50.7 kg) 113 lb 6.4 oz (51.438 kg)   General: thin, pleasant, comfortable   Heart: RRR.  No MRG Chest: tenderness in lower sternum.  RRR Abdomen: soft, ND.  No tenderness.   Extremities: no CCE.  Neuro/Psych:  Pleasant, alert, oriented x 3.  No gross deficits  Intake/Output from previous day: 01/18 0701 - 01/19 0700 In: 1700 [I.V.:1500; IV Piggyback:200] Out: 750 [Urine:750]  Intake/Output this shift:    Lab Results:  Recent Labs  10/08/14 0905 10/08/14 1100 10/09/14 0436  WBC 10.0 10.2 11.6*  HGB 8.8* 8.7* 10.1*  HCT 27.2* 26.9* 31.0*  PLT 685* 731* 754*  MCV                                         88  BMET  Recent Labs  10/07/14 1926  NA 137  K 4.8  CL 100  CO2 25  GLUCOSE 109*  BUN 13  CREATININE 0.63  CALCIUM 9.3   LFT  Recent Labs  10/07/14 2148  PROT 7.8  ALBUMIN 2.9*  AST 14  ALT 9  ALKPHOS 75  BILITOT 0.4  BILIDIR <0.1  IBILI NOT CALCULATED   Iron/TIBC/Ferritin/ %Sat    Component Value Date/Time   IRON 32* 10/08/2014 1650   TIBC 188* 10/08/2014 1650   FERRITIN 543* 10/08/2014 1650   IRONPCTSAT 17* 10/08/2014 1650   Studies/Results: Dg Chest 2 View  10/07/2014   CLINICAL DATA:  Right chest pain and  shortness of breath beginning 5 days ago.  EXAM: CHEST  2 VIEW  COMPARISON:  PA and lateral chest 09/22/2014 and 09/25/2014. CT chest 09/27/2013.  FINDINGS: Small right pleural effusion and basilar airspace disease seen on the most recent examination have improved. The right lung is clear. Mild cardiomegaly is again seen.  IMPRESSION: Improved small left effusion and basilar airspace disease since the most recent examination. No new abnormality.  Cardiomegaly.   Electronically Signed   By: Inge Rise M.D.   On: 10/07/2014 20:35   Ct Abdomen Pelvis W Contrast  10/08/2014   CLINICAL DATA:  Wt loss, anemia, upper abd pain x approx 1 month  EXAM: CT ABDOMEN AND PELVIS WITH CONTRAST  TECHNIQUE: Multidetector CT imaging of the abdomen and pelvis was performed using the standard protocol following bolus administration of intravenous contrast.  CONTRAST:  53m OMNIPAQUE IOHEXOL 300 MG/ML  SOLN  COMPARISON:  Current abdomen radiographs.  CT, 09/22/2014.  FINDINGS: Heart is mildly enlarged. There is a small pericardial effusion as well as evidence of some pericardial thickening.  This was present on the recent prior CT.  Small bilateral pleural effusions. There is dependent atelectasis in the lower lobes more evident on the left. No pulmonary edema. Pleural effusions have increased from the prior study as has atelectasis.  Liver, spleen, gallbladder, pancreas, adrenal glands: Unremarkable. 3.9 cm lower pole right renal cyst, stable. No other renal abnormalities. No hydronephrosis. Normal ureters. Bladder is unremarkable.  Uterus is enlarged and heterogeneous consistent with multiple fibroids. No adnexal masses.  No pathologically enlarged lymph nodes.  No ascites.  No bowel wall thickening or inflammatory change. No evidence of obstruction or adynamic ileus. Normal appendix visualized.  No significant bony abnormality.  IMPRESSION: 1. Pericardial effusion and evidence of pericardial thickening. Consider pericarditis  if this correlates clinically. Mild stable cardiomegaly. 2. Small effusions, increased from the prior study. Dependent lower lobe atelectasis, left greater than right, also increased. 3. No acute findings below the diaphragm. Stable right renal cyst. Enlarged fibroid uterus.   Electronically Signed   By: Lajean Manes M.D.   On: 10/08/2014 22:51   Dg Abd Portable 1v  10/08/2014   CLINICAL DATA:  involuntary guarding of the abdomen.  EXAM: ABDOMEN - 1 VIEW  COMPARISON:  None.  FINDINGS: There are no dilated loops of small bowel or air-fluid levels noted. Within the left lower quadrant there are are a few normal caliber, air-filled loops of small bowel noted. Gas is seen in throughout the colon up to the rectum.  IMPRESSION: Nonobstructive bowel gas pattern.   Electronically Signed   By: Kerby Moors M.D.   On: 10/08/2014 09:19   Scheduled Meds: . ceFEPime (MAXIPIME) IV  2 g Intravenous Q8H  . heparin  5,000 Units Subcutaneous 3 times per day  . pantoprazole  40 mg Oral Daily  . peg 3350 powder  1 kit Oral Once  . sodium chloride  3 mL Intravenous Q12H  . tuberculin  5 Units Intradermal Once  . vancomycin  750 mg Intravenous Q12H   Continuous Infusions: . sodium chloride 1,000 mL (10/09/14 0850)   PRN Meds:.acetaminophen, LORazepam **OR** LORazepam, morphine injection, traMADol   ASSESMENT:    *  Weight loss, anemia, resolved fever.  CT abdomen pelvis + for uterine fibroids but no pathology of digestive organs.  Erosive gastritis and empiric esoph dilatation on EGD 09/25/14. Neither EGD or CT neck confirmed mass at base of tongue seen on Ba swallow.   *  Non-cardiac chest pain.  CT chest with pericardial effusion and thickening. Diagnosed with pericarditis earlier this month.   *  Normocytic anemia.  Low iron, TIBC and sats; ferritin 543.    PLAN   *  TEE today?.  Not on schedule but pt with NPO orders and says she is having TEE .  *  Colonoscopy 9 AM tomorrow, split dose moviprep:  per Dr Henrene Pastor.  Will stop sq heparin tonight for this.  *  She is getting vanc and zosyn, indication is not clear to me. Fevers resolved. Are these necessary?     Azucena Freed  10/09/2014, 8:33 AM Pager: [phone]  GI ATTENDING  Interval history and data reviewed. Patient personally seen and examined. Agree with interval progress note as outlined. CT unrevealing. Mixed normocytic anemia. Plan for colonoscopy tomorrow.The nature of the procedure, as well as the risks, benefits, and alternatives were carefully and thoroughly reviewed with the patient. Ample time for discussion and questions allowed. The patient understood, was satisfied, and agreed to proceed.  Docia Chuck. Geri Seminole., M.D.  [facility]

## 2014-10-10 NOTE — CV Procedure (Signed)
TEE:  R/O SBE Anesthesia MAC done for colonoscopy prior to TEE  Normal cardiac valves with no SBE EF 60% Prominent rugae in RAA but no thrombus in it or LAA Thinned atrial septum with no PFO No aortic debres Normal RV Normal atria Normal aorta No pericardial effusion Pleural effusion present  Jenkins Rouge

## 2014-10-10 NOTE — Transfer of Care (Signed)
Immediate Anesthesia Transfer of Care Note  Patient: Angela Serrano  Procedure(s) Performed: Procedure(s): COLONOSCOPY (N/A) TRANSESOPHAGEAL ECHOCARDIOGRAM (TEE) (N/A)  Patient Location: PACU and Endoscopy Unit  Anesthesia Type:MAC  Level of Consciousness: awake, alert  and oriented  Airway & Oxygen Therapy: Patient Spontanous Breathing and Patient connected to nasal cannula oxygen  Post-op Assessment: Report given to PACU RN and Post -op Vital signs reviewed and stable  Post vital signs: Reviewed and stable  Complications: No apparent anesthesia complications

## 2014-10-10 NOTE — Progress Notes (Signed)
    Subjective: CP has decrease.  Feels better  Objective: Vital signs in last 24 hours: Temp:  [98.1 F (36.7 C)-98.7 F (37.1 C)] 98.7 F (37.1 C) (01/20 0822) Pulse Rate:  [72-82] 72 (01/20 0950) Resp:  [16-32] 32 (01/20 0950) BP: (116-156)/(63-87) 132/63 mmHg (01/20 0950) SpO2:  [100 %] 100 % (01/20 0930) Weight:  [109 lb 11.2 oz (49.76 kg)] 109 lb 11.2 oz (49.76 kg) (01/20 0439) Last BM Date: 10/10/14  Intake/Output from previous day: 01/19 0701 - 01/20 0700 In: 2286.7 [P.O.:1120; I.V.:1166.7] Out: -  Intake/Output this shift: Total I/O In: 400 [I.V.:400] Out: -   Medications Current Facility-Administered Medications  Medication Dose Route Frequency Provider Last Rate Last Dose  . 0.9 %  sodium chloride infusion   Intravenous Continuous Vena Rua, PA-C 100 mL/hr at 10/10/14 1022    . acetaminophen (TYLENOL) tablet 650 mg  650 mg Oral Q6H PRN Leeanne Rio, MD   650 mg at 10/08/14 0022  . ceFEPIme (MAXIPIME) 1 g in dextrose 5 % 50 mL IVPB  1 g Intravenous [place], [place]   1 g at 10/10/14 7106  . colchicine tablet 0.6 mg  0.6 mg Oral BID Pixie Casino, MD   0.6 mg at 10/10/14 1000  . morphine 2 MG/ML injection 2 mg  2 mg Intravenous Q2H PRN Leeanne Rio, MD   2 mg at 10/08/14 0236  . pantoprazole (PROTONIX) EC tablet 40 mg  40 mg Oral Daily Leeanne Rio, MD   40 mg at 10/10/14 1000  . sodium chloride 0.9 % injection 3 mL  3 mL Intravenous Q12H Leeanne Rio, MD   3 mL at 10/10/14 1000  . traMADol (ULTRAM) tablet 50 mg  50 mg Oral Q6H PRN Leeanne Rio, MD   50 mg at 10/09/14 1903  . tuberculin injection 5 Units  5 Units Intradermal Once Katheren Shams, DO   5 Units at 10/08/14 2149  . vancomycin (VANCOCIN) IVPB 750 mg/150 ml premix  750 mg Intravenous Q12H Anh P Pham, RPH   750 mg at 10/10/14 1000    PE: General appearance: alert, cooperative and no distress Lungs: clear to auscultation bilaterally and  Bronchial sounds left LL Heart: regular rate and rhythm, S1, S2 normal, no murmur, click, rub or gallop Extremities: No LEE Pulses: 2+ and symmetric Skin: Warm and dry Neurologic: Grossly normal  Lab Results:   Recent Labs  10/08/14 1100 10/09/14 0436 10/10/14 0500  WBC 10.2 11.6* 9.8  HGB 8.7* 10.1* 10.7*  HCT 26.9* 31.0* 32.8*  PLT 731* 754* 807*   BMET  Recent Labs  10/07/14 1926  NA 137  K 4.8  CL 100  CO2 25  GLUCOSE 109*  BUN 13  CREATININE 0.63  CALCIUM 9.3   Assessment/Plan   Active Problems:   Chest pain   Pericardial effusion   Sepsis   Acute pericarditis   CRP elevated   Weight loss   Colon cancer screening  Plan:  SP TEE to look for vegetations/endocarditis and colonoscopy.  No SBE seen.  EF 60%. No pericardial effusion.  Started on colchicine.  Seems to have some improvement.  BP and HR stable.  Normal colonoscopy.    LOS: 3 days    Angela Asfaw PA-C 10/10/2014 10:35 AM

## 2014-10-10 NOTE — Interval H&P Note (Signed)
History and Physical Interval Note:  10/10/2014 9:11 AM  Angela Serrano  has presented today for surgery, with the diagnosis of weight loss, anemia.  The various methods of treatment have been discussed with the patient and family. After consideration of risks, benefits and other options for treatment, the patient has consented to  Procedure(s): COLONOSCOPY (N/A) TRANSESOPHAGEAL ECHOCARDIOGRAM (TEE) (N/A) as a surgical intervention .  The patient's history has been reviewed, patient examined, no change in status, stable for surgery.  I have reviewed the patient's chart and labs.  Questions were answered to the patient's satisfaction.     Jenkins Rouge

## 2014-10-10 NOTE — Progress Notes (Signed)
Family Medicine Teaching Service Daily Progress Note Intern Pager: [phone]  Patient name: Angela Serrano Medical record number: [id number] Date of birth: [birth year] Age: 54 y.o. Gender: female  Primary Care Provider: Conni Slipper, MD Consultants: Cardiology, GI, Pharm Code Status: Full  Pt Overview and Major Events to Date:  1/17 - admitted for CP 1/18 - empiric vanc/cef begun for fever of unknown origin, TTE performed  1/19 - empiric colchicine begun for possible pleuropericarditis 1/20 - TEE, colonoscopy performed  Assessment and Plan: Angela Serrano is a 54 y.o. female presenting with chest discomfort and fevers. PMH is significant for recent admission to hospital for similar LUQ/flank pain, which resulted in EGD with esophageal dilatation.   # Chest pain: Resolved. On admission, significant chest pain worsened by essentially any movement. Also seems pleuritic in nature. This is concerning given her history of continued fevers. She was hospitalized a few weeks ago for similar pain that was primarily in LUQ/left flank at that time. Initial CT scan had shown moderate pericardial effusion and pt thus had transthoracic echo done with EF 50-55%, trivial pericardial effusion. Met SIRS criteria on admission, but no clear source. - cycled trop x 3 neg - EKG nl - TEE today 1/20 per cards - no signs of vegetation, thrombi, EF 60%; no pericardial effusion, pleural effusion present - consulted cards; appreciate recs - possible pleuropericarditis, also suggest trial of empiric colchicine - tramadol prn moderate pain, morphine prn severe pain - treating for possible infection as below  # Fever: Presumed CAP. Afebrile since 1/17 persistent since last admission. No focal signs of infection other than chest pain, ddx outlined above, pleural effusions visualized on CT in previous hospitalization. ANA neg earlier this month. HIV neg. Lactic acid improved.  - repeat sed rate 91 (50 during last admit) and  CRP 14.4 (13.1 during last admit)  - f/u blood cultures x 2 - ngtd - discontinue empiric vanc/cefepime  - START PO azithromycin 533m once daily, amoxicillin 1g TID - PPD and quant ordered, placed 1/18 - followup on read this PM - monitor vitals - tylenol prn fever - Will order initial rheum workup labs (AMA, RF, anti-Ro, anti-La, ds-DNA, etc)  #Anemia: Appears to be worsening since last admission. Patient denies any active bleeding, no vaginal bleeding. Uncertain etiology of worsening anemia. Prior hbg were wnl. Anemia panel consistent with anemia of chronic disease, raising suspicion for malignancy, rheum disorders.   - will trend Hbg - Improved today 10.7<< 10.1 <<8.7 - thrombocytosis gradually worsening up to 807 today, likely combination of reactive process, anemia, but cannot rule out malignancy. Will order blood smear.  - colonoscopy nl today; repeat 10y  # Weight loss: down 17lb since July 2013. Albumin low at 2.9.  - nutrition consulted - ensure UTD on age appropriate cancer screenings: no colonoscopy visible in records. Pap normal in 2013 (HPV not tested). Normal mammo in April 2013, no repeats since then.  # Sacral rash: appearance and hx consistent with herpes zoster. As present recurrence has been active for >3 weeks, will not dose acyclovir at this time.  # ETOH use: endorses near daily drinking - CIWA protocol - scores 0 since admission, will discontinue  FEN/GI: Clears today for colonoscopy, NPO after midnight for TEE, NS @ 100 cc/hr as appears dry on exam Prophylaxis: SCDs Disposition: pending clinical improvement.  Subjective:  Pt underwent procedures in AM, not available for exam.  This afternoon, pt feeling well, anxious to eat food and shower.  Reports  her chest/ LUQ pain got worse with her bowel prep yesterday evening, but improved with 75m tramadol.  It has not recurred since.  ot present in room as she is undergoing above procedures.  Will followup on recovery  this afternoon.   Objective: Temp:  [98.1 F (36.7 C)-98.7 F (37.1 C)] 98.7 F (37.1 C) (01/20 0822) Pulse Rate:  [72-82] 72 (01/20 0822) Resp:  [18-22] 22 (01/20 0822) BP: (116-156)/(65-84) 156/78 mmHg (01/20 0822) SpO2:  [100 %] 100 % (01/20 0439) Weight:  [49.76 kg (109 lb 11.2 oz)] 49.76 kg (109 lb 11.2 oz) (01/20 0439) Physical Exam:  Physical Exam  Constitutional: No distress.  Thin woman, smiling, lying in bed  HENT:  Head: Normocephalic.  Eyes: Conjunctivae and EOM are normal. No scleral icterus.  Neck: Normal range of motion. JVD present.  Cardiovascular: Normal rate, regular rhythm, normal heart sounds and intact distal pulses.  Exam reveals no gallop and no friction rub.   No murmur heard. Pulmonary/Chest: Effort normal.  Bronchial breath sound on left improved from yesterday  Abdominal: Soft. Bowel sounds are normal. She exhibits no distension. There is no tenderness.  Musculoskeletal: She exhibits no edema.  Skin: Skin is warm and dry. No rash noted. She is not diaphoretic. No erythema.  Psychiatric: She has a normal mood and affect. Her behavior is normal.     Laboratory:  Recent Labs Lab 10/08/14 1100 10/09/14 0436 10/10/14 0500  WBC 10.2 11.6* 9.8  HGB 8.7* 10.1* 10.7*  HCT 26.9* 31.0* 32.8*  PLT 731* 754* 807*    Recent Labs Lab 10/07/14 1926 10/07/14 2148  NA 137  --   K 4.8  --   CL 100  --   CO2 25  --   BUN 13  --   CREATININE 0.63  --   CALCIUM 9.3  --   PROT  --  7.8  BILITOT  --  0.4  ALKPHOS  --  75  ALT  --  9  AST  --  14  GLUCOSE 109*  --    Images/Procedures:  TEE 10/10/14: Normal cardiac valves with no SBE EF 60% Prominent rugae in RAA but no thrombus in it or LAA Thinned atrial septum with no PFO No aortic debres Normal RV Normal atria Normal aorta No pericardial effusion Pleural effusion present  Colonoscopy 10/10/14:  ENDOSCOPIC IMPRESSION: 1. Normal colonoscopy RECOMMENDATIONS: 1. Continue current colorectal  screening recommendations for "routine risk" patients with a repeat colonoscopy in 10 years. 2. Return to the care of primary service. Will sign off.  MBarbie Haggis Med Student 10/10/2014, 8:49 AM MS4, CHerricksIntern pager: [phone] text pages welcome

## 2014-10-10 NOTE — Progress Notes (Signed)
Echocardiogram Echocardiogram Transesophageal has been performed.  Angela Serrano 10/10/2014, 11:56 AM

## 2014-10-10 NOTE — H&P (View-Only) (Signed)
CARDIOLOGY CONSULT NOTE   Patient ID: Angela Serrano MRN: [id number], DOB/AGE: [birth year]   Admit date: 10/07/2014 Date of Consult: 10/08/2014   Primary Physician: Conni Slipper, MD Primary Cardiologist: None  Pt. Profile  54 year old African-American woman readmitted with recurrent chest pain.  She had had a previous admission January 2 through 09/27/2014.  Problem List  Past Medical History  Diagnosis Date  . Seasonal allergies 2011    sneezing and hoarseness  . Poor dentition   . Acid reflux disease     Past Surgical History  Procedure Laterality Date  . Myomectomy  2004  . Tubal ligation  1990  . Esophagogastroduodenoscopy N/A 09/25/2014    Procedure: ESOPHAGOGASTRODUODENOSCOPY (EGD);  Surgeon: Ladene Artist, MD;  Location: [facility] ENDOSCOPY;  Service: Endoscopy;  Laterality: N/A;  . Savory dilation N/A 09/25/2014    Procedure: SAVORY DILATION;  Surgeon: Ladene Artist, MD;  Location: [facility] ENDOSCOPY;  Service: Endoscopy;  Laterality: N/A;     Allergies  No Known Allergies  HPI   This 54 year old African-American woman was readmitted on 10/07/14 for recurrent chest pain.  Her chest pain is of a pleuritic nature and is worse when she takes a deep breath.  It is located in the low left substernal area under the left breast.  It is painful for her to sit up in bed.  Movement in bed exacerbates the pain. On her previous hospitalization the patient had an echocardiogram on 09/23/14 which showed normal left ventricle systolic function with an ejection fraction of 50-55% and there was a trivial pericardial effusion.  A CT scan of the chest on 09/22/14 had shown a moderate pericardial effusion and small bilateral pleural effusions new since previous studies. The patient states that since her discharge earlier this month she has continued to have ongoing chest discomfort.  She has been taking Tylenol.  She has continued to have fever.  Her family indicates that she has lost about 20 pounds  in the past year and she has had a poor appetite.  Inpatient Medications  . ceFEPime (MAXIPIME) IV  2 g Intravenous Q8H  . heparin  5,000 Units Subcutaneous 3 times per day  . pantoprazole  40 mg Oral Daily  . sodium chloride  3 mL Intravenous Q12H  . vancomycin  750 mg Intravenous Q12H    Family History Family History  Problem Relation Age of Onset  . Cancer Mother 6    Bone  . Alcohol abuse Father 4  . Hypertension Sister   . Hyperlipidemia Sister   . Hypertension Sister   . Thyroid disease Daughter   . Diabetes Maternal Aunt   . Cancer Maternal Aunt 60    Bone   . Diabetes Maternal Grandmother   . Cancer Maternal Aunt 63    Bone      Social History History   Social History  . Marital Status: Single    Spouse Name: N/A    Number of Children: 3  . Years of Education: N/A   Occupational History  . [employer]    Social History Main Topics  . Smoking status: Current Every Day Smoker -- 0.25 packs/day for 30 years    Types: Cigarettes  . Smokeless tobacco: Never Used     Comment: down to 3 cigs a day  . Alcohol Use: 0.0 oz/week    0 Not specified per week     Comment: one beer per night for sleep  .  Drug Use: No  . Sexual Activity: No   Other Topics Concern  . Not on file   Social History Narrative     Review of Systems  General:  Positive for chills fever and weight loss Cardiovascular:  No  dyspnea on exertion, edema, orthopnea, palpitations, paroxysmal nocturnal dyspnea.  Positive for pleuritic chest pain Dermatological: No rash, lesions/masses Respiratory: No cough, dyspnea Urologic: No hematuria, dysuria Abdominal:   No nausea, vomiting, diarrhea, bright red blood per rectum, melena, or hematemesis Neurologic:  No visual changes, wkns, changes in mental status. All other systems reviewed and are otherwise negative except as noted above.  Physical Exam  Blood pressure 100/63, pulse 77, temperature 98.1 F  (36.7 C), temperature source Oral, resp. rate 24, height 5\' 1"  (1.549 m), weight 111 lb 12.4 oz (50.7 kg), last menstrual period 04/27/2013, SpO2 96 %.  General: Pleasant, NAD.  She is thin and appears to be chronically ill Psych: Normal affect. Neuro: Alert and oriented X 3. Moves all extremities spontaneously. HEENT: Normal  Neck: Supple without bruits or JVD. Lungs:  Resp regular and unlabored.  Bronchial breath sounds and egophony at left base. Heart: RRR no s3, s4, or murmurs.  There is a low pitched pleural friction rub audible in the left anterior precordium Abdomen: Soft, non-tender, non-distended, BS + x 4.  Extremities: No clubbing, cyanosis or edema. DP/PT/Radials 2+ and equal bilaterally.  Labs   Recent Labs  10/07/14 1926 10/08/14 0239 10/08/14 0805  TROPONINI <0.03 <0.03 <0.03   Lab Results  Component Value Date   WBC 10.0 10/08/2014   HGB 8.8* 10/08/2014   HCT 27.2* 10/08/2014   MCV 89.5 10/08/2014   PLT 685* 10/08/2014    Recent Labs Lab 10/07/14 1926 10/07/14 2148  NA 137  --   K 4.8  --   CL 100  --   CO2 25  --   BUN 13  --   CREATININE 0.63  --   CALCIUM 9.3  --   PROT  --  7.8  BILITOT  --  0.4  ALKPHOS  --  75  ALT  --  9  AST  --  14  GLUCOSE 109*  --    Lab Results  Component Value Date   CHOL 127 09/23/2014   HDL 50 09/23/2014   LDLCALC 63 09/23/2014   TRIG 72 09/23/2014   Lab Results  Component Value Date   DDIMER 3.00* 09/22/2014    Radiology/Studies  Dg Chest 2 View  10/07/2014   CLINICAL DATA:  Right chest pain and shortness of breath beginning 5 days ago.  EXAM: CHEST  2 VIEW  COMPARISON:  PA and lateral chest 09/22/2014 and 09/25/2014. CT chest 09/27/2013.  FINDINGS: Small right pleural effusion and basilar airspace disease seen on the most recent examination have improved. The right lung is clear. Mild cardiomegaly is again seen.  IMPRESSION: Improved small left effusion and basilar airspace disease since the most recent  examination. No new abnormality.  Cardiomegaly.   Electronically Signed   By: Inge Rise M.D.   On: 10/07/2014 20:35   Dg Chest 2 View  09/25/2014   CLINICAL DATA:  Fever.  Shortness of breath.  Chest pain  EXAM: CHEST  2 VIEW  COMPARISON:  09/22/2014  FINDINGS: Moderate enlargement of the cardiopericardial silhouette, cardiothoracic index 60%. Small bilateral pleural effusions. No edema. Mild atelectasis in the left lower lobe similar to recent chest CT.  IMPRESSION: 1. Moderate enlargement of the cardiopericardial  silhouette. At least some of this is attributable to the pericardial effusion, which was reported out as trivial on echocardiography but is at least moderate in volume on the recent CT scan, with distention of the superior pericardial recesses. 2. Small bilateral pleural effusions with mild atelectasis in the left lower lobe.   Electronically Signed   By: Sherryl Barters M.D.   On: 09/25/2014 10:31   Dg Chest 2 View  09/22/2014   CLINICAL DATA:  Pt states flank pain with difficulty urinating. Pt symptoms started this week. No discharge. No fever known. Pain goes to left side. No hx of kidney stones. Also c/o discomfort with swallowing. Feels like food doesn't go down. Is able to swallow but keeps feeling of "fullness". Increase in belching. Hx of reflux.  EXAM: CHEST - 2 VIEW  COMPARISON:  09/27/2013  FINDINGS: Mild cardiomegaly, new since prior exam. Linear subsegmental atelectasis or scarring in the lung bases. Lungs otherwise clear. No pneumothorax. No effusion. Degenerative changes in the right shoulder.  IMPRESSION: 1. Mild cardiomegaly, new since prior exam.   Electronically Signed   By: Arne Cleveland M.D.   On: 09/22/2014 12:47   Ct Soft Tissue Neck W Contrast  09/24/2014   CLINICAL DATA:  Sensation of something being stuck in the throat. Chest pain during swallowing. Contrast swallow study showed a mass in the oropharynx at the level of the tongue base.  EXAM: CT NECK WITH CONTRAST   TECHNIQUE: Multidetector CT imaging of the neck was performed using the standard protocol following the bolus administration of intravenous contrast.  CONTRAST:  145mL OMNIPAQUE IOHEXOL 300 MG/ML  SOLN  COMPARISON:  Swallowing study same day.  FINDINGS: Pharynx and larynx: No mucosal or submucosal lesion is seen. No evidence of tongue base mass.  Salivary glands: Both parotid glands are normal. Both submandibular glands are normal.  Thyroid: Symmetric and normal  Lymph nodes: No enlarged lymph nodes on either side of the neck.  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 Mild cervical spondylosis.  Upper chest: Layering pleural effusion on the left with dependent volume loss.  IMPRESSION: There is no evidence of mucosal or submucosal mass of the tongue base. No finding to correlate with the swallowing study.   Electronically Signed   By: Nelson Chimes M.D.   On: 09/24/2014 17:34   Ct Angio Chest Pe W/cm &/or Wo Cm  09/22/2014   CLINICAL DATA:  flank pain with difficulty urinating. Pt symptoms started this week. No discharge. No fever known. Pain goes to left side. No hx of kidney stones. Also c/o discomfort with swallowing. Feels like food doesn't go down. Is able to swallow but keeps feeling of "fullness". Increase in belching. Hx of reflux. Not taking medications for it. "not like she should" Pt said left sided back ainElevated d dimer  EXAM: CT ANGIOGRAPHY CHEST WITH CONTRAST  TECHNIQUE: Multidetector CT imaging of the chest was performed using the standard protocol during bolus administration of intravenous contrast. Multiplanar CT image reconstructions and MIPs were obtained to evaluate the vascular anatomy.  CONTRAST:  168mL OMNIPAQUE IOHEXOL 350 MG/ML SOLN  COMPARISON:  09/27/2013  FINDINGS: SVC patent. RV/LV ratio less than 1. Satisfactory opacification of pulmonary  arteries noted, and there is no evidence of pulmonary emboli. Patent superior and inferior pulmonary veins bilaterally. Adequate contrast opacification of the thoracic aorta with no evidence of dissection, aneurysm, or  stenosis. There is bovine variant brachiocephalic arch anatomy without proximal stenosis.  There is a moderate pericardial effusion , new since prior study. Trace bilateral pleural effusions. No hilar or mediastinal adenopathy. Moderate dependent atelectasis in both lower lobes left greater than right, and posteriorly in the left upper lobe. Thoracic spine and sternum intact. Visualized portions of upper abdomen unremarkable.  Review of the MIP images confirms the above findings.  IMPRESSION: 1. Negative for acute PE or thoracic aortic dissection. 2. Moderate pericardial effusion and trace bilateral pleural effusions, new since prior study.   Electronically Signed   By: Arne Cleveland M.D.   On: 09/22/2014 16:43   Dg Esophagus  09/24/2014   CLINICAL DATA:  Chronic GE reflux, recent globus sensation in the back of the throat, recent mid chest pain during swallows, recent sensation of solid food getting stuck in the throat.  EXAM: ESOPHOGRAM / BARIUM SWALLOW / BARIUM TABLET STUDY  TECHNIQUE: Combined double contrast and single contrast examination was performed using effervescent crystals, thick barium liquid, and thin barium liquid. The patient was observed with fluoroscopy swallowing a 13 mm barium sulfate tablet.  FLUOROSCOPY TIME:  2 min 24 sec, pulsed fluoroscopy.  COMPARISON:  None.  FINDINGS: Patient swallowed the thick and thin barium liquid without difficulty. Primary esophageal peristalsis is well-preserved, with only slight, insignificant break up of the primary wave in the upper esophagus on a single swallow, without break up of the primary wave on subsequent swallows. No fixed esophageal strictures or masses. No evidence of hiatal hernia. Gastroesophageal reflux was not elicited with use  of the water siphon maneuver.  Evaluation of the pharynx during rapid sequence imaging with swallows of thin barium liquid demonstrate a mass at the level of the base of the tongue which causes a filling defect in the swallowed barium. There is also evidence of laryngeal penetration to the level of the cords, though there is no evidence of frank tracheal aspiration.  IMPRESSION: 1. Mass in the oropharynx at the level of the base of the tongue. CT of the neck with contrast is recommended in further evaluation to confirm or deny this finding. 2. Laryngeal penetration of thin barium liquid to the level of the vocal cords, without evidence of frank tracheal aspiration. 3. No significant abnormality involving the esophagus.   Electronically Signed   By: Evangeline Dakin M.D.   On: 09/24/2014 14:59   Dg Abd Portable 1v  10/08/2014   CLINICAL DATA:  involuntary guarding of the abdomen.  EXAM: ABDOMEN - 1 VIEW  COMPARISON:  None.  FINDINGS: There are no dilated loops of small bowel or air-fluid levels noted. Within the left lower quadrant there are are a few normal caliber, air-filled loops of small bowel noted. Gas is seen in throughout the colon up to the rectum.  IMPRESSION: Nonobstructive bowel gas pattern.   Electronically Signed   By: Kerby Moors M.D.   On: 10/08/2014 09:19   Dg Swallowing Func-speech Pathology  09/26/2014   Earle Gell McCoy, CCC-SLP     09/26/2014  9:27 AM Objective Swallowing Evaluation: Modified Barium Swallowing Study   Patient Details  Name: LEONILDA COZBY MRN: [id number] Date of Birth: April 08, 1961  Today's Date: 09/26/2014 Time: 0900-0922 SLP Time Calculation (min) (ACUTE ONLY): 22 min  Past Medical History:  Past Medical History  Diagnosis Date  . Seasonal allergies 2011    sneezing and hoarseness  . Poor dentition   . Acid reflux disease    Past Surgical History:  Past Surgical History  Procedure Laterality Date  . Myomectomy  2004  . Tubal ligation  1990  . Esophagogastroduodenoscopy N/A  09/25/2014    Procedure: ESOPHAGOGASTRODUODENOSCOPY (EGD);  Surgeon: Ladene Artist, MD;  Location: [facility] ENDOSCOPY;  Service: Endoscopy;   Laterality: N/A;  . Savory dilation N/A 09/25/2014    Procedure: SAVORY DILATION;  Surgeon: Ladene Artist, MD;   Location: [facility] ENDOSCOPY;  Service: Endoscopy;  Laterality: N/A;   HPI:  Shaundrea L Balsley is a 54 y.o. female presenting with vague chest  complaints of difficulty swallowing/globus sensation and left  flank/rib pain, found to have pericardial effusion on imaging.  PMH is significant for GERD, tobacco abuse, daily alcohol  consumption.Barium swallow with apparent mass at base of tongue,  not confirmed on CT.     Assessment / Plan / Recommendation Clinical Impression  Dysphagia Diagnosis: Within Functional Limits Clinical impression: Patient presents with normal swallowing  function with timely oral transit, initiation of the swallow,  clearance of bolus, and full airway protection. Reviewed imaged  from barium swallow. Abnormality documented as possible mass at  base of tongue region not seen on todays exam. Reviewed general  esophageal precautions with patient as suspect that symptoms are  primarily esophageal in nature given EGD results. Patient  verbalized understanding. No further SLP needs indicated at this  time.     Treatment Recommendation  No treatment recommended at this time    Diet Recommendation Regular;Thin liquid   Liquid Administration via: Cup;Straw Medication Administration: Whole meds with liquid Supervision: Patient able to self feed Compensations: Small sips/bites;Slow rate;Follow solids with  liquid Postural Changes and/or Swallow Maneuvers: Seated upright 90  degrees;Upright 30-60 min after meal    Other  Recommendations Oral Care Recommendations: Oral care BID   Follow Up Recommendations  None            General Date of Onset: 09/22/14 HPI: BRYAUNA BYRUM is a 54 y.o. female presenting with vague chest  complaints of difficulty swallowing/globus sensation and  left  flank/rib pain, found to have pericardial effusion on imaging.  PMH is significant for GERD, tobacco abuse, daily alcohol  consumption.Barium swallow with apparent mass at base of tongue,  not confirmed on CT. Type of Study: Modified Barium Swallowing Study Reason for Referral: Objectively evaluate swallowing function Previous Swallow Assessment: see HPI Diet Prior to this Study: Regular;Thin liquids Temperature Spikes Noted: No Respiratory Status: Room air History of Recent Intubation: No Behavior/Cognition: Alert;Cooperative;Pleasant mood Oral Cavity - Dentition: Poor condition;Missing dentition Oral Motor / Sensory Function: Within functional limits Self-Feeding Abilities: Able to feed self Patient Positioning: Upright in chair Baseline Vocal Quality: Clear Volitional Cough: Strong Volitional Swallow: Able to elicit Anatomy: Within functional limits (see clinical impression  statement) Pharyngeal Secretions: Not observed secondary MBS    Reason for Referral Objectively evaluate swallowing function   Oral Phase Oral Preparation/Oral Phase Oral Phase: WFL   Pharyngeal Phase Pharyngeal Phase Pharyngeal Phase: Within functional limits  Cervical Esophageal Phase    GO    Cervical Esophageal Phase Cervical Esophageal Phase: [facility]    Functional Assessment Tool Used: skilled clinical judgement Functional Limitations: Swallowing Swallow Current Status (Z6109): 0 percent impaired, limited or  restricted Swallow Goal Status (U0454): 0 percent impaired, limited or  restricted Swallow Discharge Status (U9811): 0 percent impaired, limited or  restricted   Gabriel Rainwater MA, CCC-SLP [phone]  McCoy Leah [name] 09/26/2014, 9:26 AM    Ct Renal Stone Study  09/22/2014  CLINICAL DATA:  Left flank pain.  Difficulty urinating.  EXAM: CT ABDOMEN AND PELVIS WITHOUT CONTRAST  TECHNIQUE: Multidetector CT imaging of the abdomen and pelvis was performed following the standard protocol without IV contrast.  COMPARISON:  CT scan of the  chest dated 09/27/2013  FINDINGS: There is a new a moderate pericardial effusion with a small left pleural effusion and slight bibasilar atelectasis. There is borderline cardiomegaly.  The liver appears normal. There is suggestion of sludge in the otherwise normal gallbladder. No dilated bile ducts. The spleen, pancreas, adrenal glands, and left kidney are normal. There is a 4.5 cm cyst on the lower pole the otherwise normal right kidney. There are no renal or ureteral or bladder calculi.  The bowel appears normal including the terminal ileum and appendix.  The uterus is enlarged, measuring 11 by 7.3 by 5.6 cm. The residual inhomogeneous consistent with fibroids. The ovaries are normal. Bladder is normal.  No significant osseous abnormality.  IMPRESSION: 1. Moderate pericardial effusion with slight atelectasis at both lung bases along with a small left pleural effusion. 2. Uterine fibroids. 3. No acute abnormalities of the abdomen.   Electronically Signed   By: Rozetta Nunnery M.D.   On: 09/22/2014 14:44    ECG  08-Oct-2014 06:56:39 [facility] ROUTINE RECORD Normal sinus rhythm Normal ECG Personally reviewed.  No convincing EKG evidence of pericarditis.  ASSESSMENT AND PLAN  1.  Chest pain possibly secondary to pleuropericarditis. 2.  Fever and weight loss uncertain etiology.  Marked elevation of sedimentation rate (91) and C-reactive protein, rule out polymyalgia rheumatica.  Rule out occult neoplasm. 3.  Worsening anemia uncertain etiology  Plan: Await results of repeat transthoracic echo.  If negative, consider TEE to rule out vegetations. Consider placement of PPD skin test if not already checked.  Await results of echocardiogram today and then consider empiric trial of colchicine 0.6 mg daily. Will follow  Signed, Darlin Coco, MD  10/08/2014, 11:40 AM

## 2014-10-10 NOTE — Anesthesia Postprocedure Evaluation (Signed)
  Anesthesia Post-op Note  Patient: Angela Serrano  Procedure(s) Performed: Procedure(s): COLONOSCOPY (N/A) TRANSESOPHAGEAL ECHOCARDIOGRAM (TEE) (N/A)  Patient Location: PACU  Anesthesia Type:MAC  Level of Consciousness: awake, oriented, sedated and patient cooperative  Airway and Oxygen Therapy: Patient Spontanous Breathing  Post-op Pain: none  Post-op Assessment: Post-op Vital signs reviewed, Patient's Cardiovascular Status Stable, Respiratory Function Stable, Patent Airway, No signs of Nausea or vomiting and Pain level controlled  Post-op Vital Signs: stable  Last Vitals:  Filed Vitals:   10/10/14 0950  BP: 132/63  Pulse: 72  Temp:   Resp: 32    Complications: No apparent anesthesia complications

## 2014-10-10 NOTE — Progress Notes (Signed)
PPD skin test on right anterior forearm read as negative on 10/10/13 at 2149.  Irving Shows, RN

## 2014-10-10 NOTE — Anesthesia Preprocedure Evaluation (Addendum)
Anesthesia Evaluation  Patient identified by MRN, date of birth, ID band Patient awake    Reviewed: Allergy & Precautions, NPO status , Patient's Chart, lab work & pertinent test results  Airway        Dental   Pulmonary Current Smoker,          Cardiovascular     Neuro/Psych    GI/Hepatic GERD-  ,  Endo/Other    Renal/GU      Musculoskeletal   Abdominal   Peds  Hematology  (+) anemia ,   Anesthesia Other Findings   Reproductive/Obstetrics                            Anesthesia Physical Anesthesia Plan  ASA: III  Anesthesia Plan: MAC and General   Post-op Pain Management:    Induction: Intravenous  Airway Management Planned: Mask  Additional Equipment:   Intra-op Plan:   Post-operative Plan:   Informed Consent: I have reviewed the patients History and Physical, chart, labs and discussed the procedure including the risks, benefits and alternatives for the proposed anesthesia with the patient or authorized representative who has indicated his/her understanding and acceptance.     Plan Discussed with: CRNA, Anesthesiologist and Surgeon  Anesthesia Plan Comments:         Anesthesia Quick Evaluation

## 2014-10-10 NOTE — Interval H&P Note (Signed)
History and Physical Interval Note:  10/10/2014 8:55 AM  Angela Serrano  has presented today for surgery, with the diagnosis of weight loss, anemia.  The various methods of treatment have been discussed with the patient and family. After consideration of risks, benefits and other options for treatment, the patient has consented to  Procedure(s): COLONOSCOPY (N/A) TRANSESOPHAGEAL ECHOCARDIOGRAM (TEE) (N/A) as a surgical intervention .  The patient's history has been reviewed, patient examined, no change in status, stable for surgery.  I have reviewed the patient's chart and labs.  Questions were answered to the patient's satisfaction.     [name]

## 2014-10-10 NOTE — Telephone Encounter (Signed)
Pt in hospital at present time. Angela Serrano, Angela Serrano

## 2014-10-10 NOTE — Discharge Summary (Signed)
[facility] Discharge Summary  Patient name: Angela Serrano Medical record number: [id number] Date of birth: 10/23/1960 Age: 54 y.o. Gender: female Date of Admission: 10/07/2014  Date of Discharge: 10/11/14 Admitting Physician: Lupita Dawn, MD  Primary Care Provider: Conni Slipper, MD Consultants: Cardiology, Gastroenterology, Pharmacy  Indication for Hospitalization: Fever, CP, wt loss  Discharge Diagnoses/Problem List:  Chest Pain Fever Elevated CRP/ESR Anemia Sacral rash EtOH  Disposition: Home  Discharge Condition: Stable  Discharge Exam:  BP 142/80 mmHg  Pulse 81  Temp(Src) 98.5 F (36.9 C) (Oral)  Resp 16  Ht $R'5\' 1"'ks$  (1.549 m)  Wt 109 lb 11.2 oz (49.76 kg)  BMI 20.74 kg/m2  SpO2 100%  LMP 04/27/2013 Constitutional: No distress.  Thin woman, smiling, lying in bed  Eyes: Conjunctivae and EOM are normal. No scleral icterus.  Neck: Normal range of motion. JVD present.  Cardiovascular: Normal rate, regular rhythm, normal heart sounds and intact distal pulses. Exam reveals no gallop and no friction rub.  No murmur heard. Pulmonary/Chest: Effort normal.  Bronchial breath sound on left similar to yesterday Abdominal: Soft. Bowel sounds are normal. She exhibits no distension. There is no tenderness.  Musculoskeletal: She exhibits no edema.  Skin: Skin is warm and dry. No rash noted. She is not diaphoretic. No erythema.  Psychiatric: She has a normal mood and affect. Her behavior is normal.   Brief Hospital Course:  Angela Serrano is a 55 y.o. female presenting with chest discomfort and fevers. PMH is significant for recent admission to hospital for similar LUQ/flank pain, which resulted in EGD with esophageal dilatation.   # Chest pain: Resolved. On admission, significant pleuritic chest pain worsened by essentially any movement. This is concerning given her history of continued fevers. She was hospitalized a few weeks prior for similar pain  that was primarily in LUQ/left flank. Initial CT scan had shown moderate pericardial effusion and pt thus had transthoracic echo done with EF 50-55%, trivial pericardial effusion. Met SIRS criteria on admission, but no clear source.  EKG, troponins were unrevealing. Cardiology was consulted, TTE and TEE were obtained, with no signs of vegetation, thrombi, EF 60%; no pericardial effusion, pleural effusion still present.  Pleuropericarditis was also considered, she was given a trial of empiric colchicine. Due to prohibitive cost, ibuprofen was advised for outpatient treatment as an alternative to colchicine.   # Fever:  Persistent since last admission, with no focal signs of infection other than chest pain, ddx outlined above, pleural effusions visualized on CT in previous hospitalization. Afebrile since 1/17 following initiation of IV vanc/cefepime therapy.  ANA neg earlier this month. HIV neg. Lactic acid 1.16 on admission, improved. Workup, including pan-scan of pelvis, abdomen, chest, blood and urine cultures were all negative.  PPD negative.  Given improvement in clincal status with antibiotic therapy, she was transitioned to oral levaquin $RemoveBefor'750mg'syAzZXxoxquX$  daily on 1/21 for presumed CAP. DDx includes HCAP, aspiration pneumonia.  Rheumatologic conditions were also considered as a source of periodic fevers, given worsening ESR 91 and CRP of 14.4; workup is still pending including RF and SPEP.  Follow up with Rheumatology as an outpatient is recommended. If fevers have returned at PCP FU in one week, consider anaerobe coverage for aspiration pneumonia.  # Anemia: Initially worsened compared to prior hospitalization, nadir of 8.8.  Anemia panel consistent with anemia of chronic disease. Colonoscopy performed on 1/20 revealed no source of bleeding; repeat in 10 years. Patient denies any active bleeding, no vaginal bleeding.  Prior Hg 3 weeks prior were wnl. Also, has accompanying thrombocytosis, peaking at 807.  Blood smear  is pending.  Hg stable at 9.6 on discharge.   # Weight loss: Unclear etiology; down 17lb since July 2013. Albumin low at 2.9.Colonoscopy without signs of malignancy, last Pap nl in 2013 (HPV not tested), mammogram nl in April 2013.  # Sacral rash: Appearance and hx consistent with herpes zoster, present for 3 weeks prior to admission. Healing well on discharge.  # ETOH use: endorses near daily drinking, CIWA scores remained at 0 throughout hospitalization. Monitoring discontinued on HOD3. Of note review of CT abdomen does appear to have rather large liver, which was read as normal by radiologist.   Prophylaxis: Pt initially refused subcutaneous heparin; SCDs were placed on HOD3  Issues for Follow Up:  - F/u for recurrence of fevers. If fevers have returned at PCP FU in one week, consider anaerobe coverage for aspiration pneumonia. - Review rheum labs  Significant Procedures:  TEE 10/10/14:   Normal cardiac valves with no SBE EF 60% Prominent rugae in RAA but no thrombus in it or LAA Thinned atrial septum with no PFO No aortic debres Normal RV Normal atria Normal aorta No pericardial effusion Pleural effusion present  Colonoscopy 10/10/14: ENDOSCOPIC IMPRESSION: 1. Normal colonoscopy RECOMMENDATIONS: 1. Continue current colorectal screening recommendations for "routine risk" patients with a repeat colonoscopy in 10 years.  Significant Labs and Imaging:   Recent Labs Lab 10/09/14 0436 10/10/14 0500 10/11/14 0323  WBC 11.6* 9.8 9.5  HGB 10.1* 10.7* 9.6*  HCT 31.0* 32.8* 29.2*  PLT 754* 807* 653*    Recent Labs Lab 10/07/14 1926 10/07/14 2148  NA 137  --   K 4.8  --   CL 100  --   CO2 25  --   GLUCOSE 109*  --   BUN 13  --   CREATININE 0.63  --   CALCIUM 9.3  --   ALKPHOS  --  75  AST  --  14  ALT  --  9  ALBUMIN  --  2.9*   Results for HANNAHGRACE, LALLI (MRN [id number]) as of 10/11/2014 12:57  Ref. Range 10/10/2014 19:31  CK Total Latest Range: 7-177 U/L 51  ds  DNA Ab Latest Units: IU/mL <1   Results for KANDI, BRUSSEAU (MRN [id number]) as of 10/11/2014 12:57  Ref. Range 10/08/2014 16:50  Iron Latest Range: 42-145 ug/dL 32 (L)  UIBC Latest Range: 125-400 ug/dL 156  TIBC Latest Range: 250-470 ug/dL 188 (L)  Saturation Ratios Latest Range: 20-55 % 17 (L)  Ferritin Latest Range: 10-291 ng/mL 543 (H)  Folate Latest Units: ng/mL 15.8  Vitamin B-12 Latest Range: 211-911 pg/mL 510  RBC. Latest Range: 3.87-5.11 MIL/uL 3.16 (L)  Retic Ct Pct Latest Range: 0.4-3.1 % 0.9  Retic Count, Manual Latest Range: 19.0-186.0 K/uL 28.4   CXR 10/07/14: IMPRESSION: Improved small left effusion and basilar airspace disease since the most recent examination. No new abnormality. Cardiomegaly.  ABd XR 10/08/14: IMPRESSION: Nonobstructive bowel gas pattern.  CT abd pelv 10/08/14: IMPRESSION: 1. Pericardial effusion and evidence of pericardial thickening. Consider pericarditis if this correlates clinically. Mild stable cardiomegaly. 2. Small effusions, increased from the prior study. Dependent lower lobe atelectasis, left greater than right, also increased. 3. No acute findings below the diaphragm. Stable right renal cyst. Enlarged fibroid uterus.    Results/Tests Pending at Time of Discharge: Blood smear, RF, SPEP  Discharge Medications:    Medication List  TAKE these medications        acetaminophen 500 MG tablet  Commonly known as:  TYLENOL  Take 500 mg by mouth every 6 (six) hours as needed for mild pain.     aspirin-acetaminophen-caffeine 250-250-65 MG per tablet  Commonly known as:  EXCEDRIN MIGRAINE  Take 1 tablet by mouth every 6 (six) hours as needed for headache.     calcium carbonate 500 MG chewable tablet  Commonly known as:  TUMS - dosed in mg elemental calcium  Chew 1 tablet by mouth 2 (two) times daily as needed for indigestion or heartburn.     colchicine 0.6 MG tablet  Take 1 tablet (0.6 mg total) by mouth 2 (two) times daily.      ibuprofen 600 MG tablet  Commonly known as:  ADVIL,MOTRIN  Take 1 tablet (600 mg total) by mouth every 6 (six) hours.     levofloxacin 750 MG tablet  Commonly known as:  LEVAQUIN  Take 1 tablet (750 mg total) by mouth daily.     pantoprazole 40 MG tablet  Commonly known as:  PROTONIX  Take 1 tablet (40 mg total) by mouth daily. Needs visit with PCP.     traMADol 50 MG tablet  Commonly known as:  ULTRAM  Take 1 tablet (50 mg total) by mouth every 6 (six) hours as needed.        Discharge Instructions: Please refer to Patient Instructions section of EMR for full details.  Patient was counseled important signs and symptoms that should prompt return to medical care, changes in medications, dietary instructions, activity restrictions, and follow up appointments.   Follow-Up Appointments: Follow-up Information    Follow up with Jenkins Rouge, MD.   Specialty:  Cardiology   Why:  Office will call you for your followup appointment. Call office if you have not heard back in 3 days.   Contact information:   [address] [place] [address] [phone]       Follow up with Conni Slipper, MD On 10/16/2014.   Specialty:  Family Medicine   Why:  at 1:45pm for hospital f/u   Contact information:   [place] [address] [phone]      Barbie Haggis, Med Student 10/10/2014, 4:24 PM MS4, [place], MD 10/11/2014, 2:07 PM PGY-2, [place]

## 2014-10-11 ENCOUNTER — Encounter (HOSPITAL_COMMUNITY): Payer: Self-pay | Admitting: Internal Medicine

## 2014-10-11 LAB — CBC
HEMATOCRIT: 29.2 % — AB (ref 36.0–46.0)
HEMOGLOBIN: 9.6 g/dL — AB (ref 12.0–15.0)
MCH: 29.4 pg (ref 26.0–34.0)
MCHC: 32.9 g/dL (ref 30.0–36.0)
MCV: 89.3 fL (ref 78.0–100.0)
Platelets: 653 10*3/uL — ABNORMAL HIGH (ref 150–400)
RBC: 3.27 MIL/uL — ABNORMAL LOW (ref 3.87–5.11)
RDW: 12.6 % (ref 11.5–15.5)
WBC: 9.5 10*3/uL (ref 4.0–10.5)

## 2014-10-11 LAB — ANTI-DNA ANTIBODY, DOUBLE-STRANDED: ds DNA Ab: <1 IU/mL

## 2014-10-11 MED ORDER — LEVOFLOXACIN 750 MG PO TABS: 750.0000 mg | ORAL_TABLET | Freq: Every day | ORAL | Status: DC

## 2014-10-11 MED ORDER — IBUPROFEN 600 MG PO TABS: 600.0000 mg | ORAL_TABLET | Freq: Four times a day (QID) | ORAL | Status: DC

## 2014-10-11 MED ORDER — COLCHICINE 0.6 MG PO TABS: 0.6000 mg | ORAL_TABLET | Freq: Two times a day (BID) | ORAL | Status: DC

## 2014-10-11 NOTE — Discharge Instructions (Addendum)
While in the hospital you were evaluated by the gastroenterologists and the cardiologists. You had a workup including endoscopy, echocardiography (ultrasound images of the heart), all of which was normal. We did also do some more blood tests that are pending and won't come back for several days, please discuss the results with Dr. Darene Lamer at your follow up visit at the [facility].  For chest pain: cardiologists started you on a medicine called colchicine, this is very expensive. A prescription was sent to the pharmacy, if you cannot afford this, we suggest using over the counter Ibuprofen, take 600mg  every 6 hours. The duration of treatment with ibuprofen is at least 1-2 weeks as long as symptoms have resolved, at which point you can decrease the frequency you are taking it until you stop using it completely. While taking either of these medications it is important to continue taking your acid reflux medication, Protonix.  You need to take 1 more day of levaquin for a suspected pneumonia.

## 2014-10-11 NOTE — Progress Notes (Signed)
I have sent a message to our [facility] office's scheduler requesting a follow-up appointment for 1-2 weeks and our office will call the patient with this information. Based on consult from 09/23/14 I think she belongs to Dr. Johnsie Cancel as her primary cardiologist. Melina Copa PA-C

## 2014-10-11 NOTE — Care Management Note (Addendum)
    Page 1 of 1   10/11/2014     11:50:00 AM CARE MANAGEMENT NOTE 10/11/2014  Patient:  Angela Serrano, Angela Serrano   Account Number:  [id number]  Date Initiated:  10/11/2014  Documentation initiated by:  GRAVES-BIGELOW,Valina Maes  Subjective/Objective Assessment:   Pt admitted for cp and fever. TEE negative for any vegetations, changed to PO levaquin.     Action/Plan:   Plan for d/c today. NO needs identified by CM at this time.   Anticipated DC Date:  10/11/2014   Anticipated DC Plan:  [place]  CM consult      Choice offered to / List presented to:             Status of service:  Completed, signed off Medicare Important Message given?  NO (If response is "NO", the following Medicare IM given date fields will be blank) Date Medicare IM given:   Medicare IM given by:   Date Additional Medicare IM given:   Additional Medicare IM given by:    Discharge Disposition:  HOME/SELF CARE  Per UR Regulation:  Reviewed for med. necessity/level of care/duration of stay  If discussed at [place] of Stay Meetings, dates discussed:    Comments:  10-11-14 [address] Jacqlyn Krauss, RN,BSN [phone] CM did speak with pt and she has been getting medications from [facility]. Pt has PCP at the [facility] and she has spoken to [place] in ref to orange card assistance. She has to provide her paperwork and old pay stubs. CM did call to the [facility] and levofloxacin (LEVAQUIN) tablet 750 mg can be purchased for $11.30. Pt is aware and agreeable to price. No further needs from CM at this time.

## 2014-10-12 LAB — PROTEIN ELECTROPHORESIS, SERUM
ALBUMIN ELP: 41.4 % — AB (ref 55.8–66.1)
ALPHA-1-GLOBULIN: 9.2 % — AB (ref 2.9–4.9)
Alpha-2-Globulin: 20.1 % — ABNORMAL HIGH (ref 7.1–11.8)
BETA 2: 6 % (ref 3.2–6.5)
Beta Globulin: 4.8 % (ref 4.7–7.2)
Gamma Globulin: 18.5 % (ref 11.1–18.8)
M-SPIKE, %: NOT DETECTED g/dL
TOTAL PROTEIN ELP: 6.7 g/dL (ref 6.0–8.3)

## 2014-10-14 LAB — CULTURE, BLOOD (ROUTINE X 2)
Culture: NO GROWTH
Culture: NO GROWTH

## 2014-10-16 ENCOUNTER — Inpatient Hospital Stay: Payer: 59 | Admitting: Family Medicine

## 2014-10-16 LAB — RHEUMATOID FACTOR: Rhuematoid fact SerPl-aCnc: 34 IU/mL — ABNORMAL HIGH (ref 0–20)

## 2014-10-23 ENCOUNTER — Other Ambulatory Visit: Payer: Self-pay | Admitting: Family Medicine

## 2014-10-23 DIAGNOSIS — Z1231 Encounter for screening mammogram for malignant neoplasm of breast: Secondary | ICD-10-CM

## 2014-10-29 ENCOUNTER — Ambulatory Visit (HOSPITAL_COMMUNITY): Payer: 59 | Attending: Family Medicine

## 2014-11-05 NOTE — Progress Notes (Signed)
Patient ID: Angela Serrano, female   DOB: April 03, 1961, 54 y.o.   MRN: [id number] 54 yo smoker with no previous cardiac history. Initially seen in hsoptial 09/23/14   Has been having pressure build up in chest. Difficulty swallowing food feeling that things get stuck and not "progressing" No real chest pain or dyspnea Voice chronically bronchitic. Also began having lower back pain and thought she might have UTI Came to ER to be evaluated Thought she had low grade fever  Drinks daily and smokes 1/2 ppd Not clear why CT ordered but no PE and "moderate " Pericardial effusion noted Multiple family members with autoimmune disease ESR 60  F/U TTE trivial effusion.  Had EGD with dilatation.  AVA negative CRP 13  Readmitted 1/18 for same.  TEE with no SBE normal EF and no signficant valve disease PPD negative.  ? Pleuropericarditis Rx ibuprofen  Could not afford colchicine but got some in hospital  Since d/c doing better No chest pain dyspnea or palpitations Still smoking but indicates not drinking  1/16  TSH was normal     ROS: Denies fever, malais, weight loss, blurry vision, decreased visual acuity, cough, sputum, SOB, hemoptysis, pleuritic pain, palpitaitons, heartburn, abdominal pain, melena, lower extremity edema, claudication, or rash.  All other systems reviewed and negative  General: Affect appropriate Bronchitic chronically ill appearing black female  HEENT: normal Neck supple with no adenopathy JVP normal no bruits no thyromegaly Lungs clear with no wheezing and good diaphragmatic motion Heart:  S1/S2 no murmur, no rub, gallop or click PMI normal Abdomen: benighn, BS positve, no tenderness, no AAA no bruit.  No HSM or HJR Distal pulses intact with no bruits No edema Neuro non-focal Skin warm and dry No muscular weakness   Current Outpatient Prescriptions  Medication Sig Dispense Refill  . acetaminophen (TYLENOL) 500 MG tablet Take 500 mg by mouth every 6 (six) hours as needed for  mild pain.    Marland Kitchen aspirin-acetaminophen-caffeine (EXCEDRIN MIGRAINE) 250-250-65 MG per tablet Take 1 tablet by mouth every 6 (six) hours as needed for headache.    . calcium carbonate (TUMS - DOSED IN MG ELEMENTAL CALCIUM) 500 MG chewable tablet Chew 1 tablet by mouth 2 (two) times daily as needed for indigestion or heartburn.    . colchicine 0.6 MG tablet Take 1 tablet (0.6 mg total) by mouth 2 (two) times daily. 40 tablet 0  . ibuprofen (ADVIL,MOTRIN) 600 MG tablet Take 1 tablet (600 mg total) by mouth every 6 (six) hours. 30 tablet 0  . levofloxacin (LEVAQUIN) 750 MG tablet Take 1 tablet (750 mg total) by mouth daily. 1 tablet 0  . pantoprazole (PROTONIX) 40 MG tablet Take 1 tablet (40 mg total) by mouth daily. Needs visit with PCP. 30 tablet 0  . traMADol (ULTRAM) 50 MG tablet Take 1 tablet (50 mg total) by mouth every 6 (six) hours as needed. 20 tablet 0   No current facility-administered medications for this visit.    Allergies  Review of patient's allergies indicates no known allergies.  Electrocardiogram:  10/08/14  ST rate 118 otherwise normal no signs pericarditis   Assessment and Plan

## 2014-11-07 ENCOUNTER — Ambulatory Visit (INDEPENDENT_AMBULATORY_CARE_PROVIDER_SITE_OTHER): Payer: Self-pay | Admitting: Cardiovascular Disease

## 2014-11-07 ENCOUNTER — Encounter: Payer: Self-pay | Admitting: Cardiovascular Disease

## 2014-11-07 VITALS — BP 120/84 | HR 88 | Ht 61.0 in | Wt 108.1 lb

## 2014-11-07 DIAGNOSIS — R7982 Elevated C-reactive protein (CRP): Secondary | ICD-10-CM

## 2014-11-07 DIAGNOSIS — I313 Pericardial effusion (noninflammatory): Secondary | ICD-10-CM

## 2014-11-07 DIAGNOSIS — I319 Disease of pericardium, unspecified: Secondary | ICD-10-CM

## 2014-11-07 DIAGNOSIS — R1310 Dysphagia, unspecified: Secondary | ICD-10-CM

## 2014-11-07 DIAGNOSIS — I3139 Other pericardial effusion (noninflammatory): Secondary | ICD-10-CM

## 2014-11-07 DIAGNOSIS — Z720 Tobacco use: Secondary | ICD-10-CM

## 2014-11-07 NOTE — Assessment & Plan Note (Signed)
Resolved no dyspnea or chest pain Improved taking motrin sporadically and could not afford colchicine

## 2014-11-07 NOTE — Assessment & Plan Note (Signed)
Not clear what systemic inflammatory process she has had Consider f/u ESR and CRP with primary Further w/u autoimmune disease TSH is normal

## 2014-11-07 NOTE — Patient Instructions (Signed)
Your physician recommends that you schedule a follow-up appointment in: AS NEEDED  Your physician recommends that you continue on your current medications as directed. Please refer to the Current Medication list given to you today.  

## 2014-11-07 NOTE — Assessment & Plan Note (Signed)
Counseled for less than 10 minutes has cut back but does not seem motivated to quit

## 2014-11-07 NOTE — Assessment & Plan Note (Signed)
Post esophageal dilatation f/u GI improved

## 2014-11-19 ENCOUNTER — Ambulatory Visit (HOSPITAL_COMMUNITY)
Admission: RE | Admit: 2014-11-19 | Discharge: 2014-11-19 | Disposition: A | Discharge status: Home / Self Care | Payer: 59 | Source: Ambulatory Visit | Attending: Family Medicine | Admitting: Family Medicine

## 2014-11-19 DIAGNOSIS — Z1231 Encounter for screening mammogram for malignant neoplasm of breast: Secondary | ICD-10-CM | POA: Insufficient documentation

## 2014-11-21 ENCOUNTER — Ambulatory Visit: Payer: Self-pay | Admitting: Family Medicine

## 2014-12-05 ENCOUNTER — Encounter: Payer: Self-pay | Admitting: Family Medicine

## 2014-12-05 ENCOUNTER — Other Ambulatory Visit (HOSPITAL_COMMUNITY)
Admission: RE | Admit: 2014-12-05 | Discharge: 2014-12-05 | Disposition: A | Discharge status: Home / Self Care | Payer: 59 | Source: Ambulatory Visit | Attending: Family Medicine | Admitting: Family Medicine

## 2014-12-05 ENCOUNTER — Telehealth: Payer: Self-pay | Admitting: Family Medicine

## 2014-12-05 ENCOUNTER — Ambulatory Visit (INDEPENDENT_AMBULATORY_CARE_PROVIDER_SITE_OTHER): Payer: 59 | Admitting: Family Medicine

## 2014-12-05 VITALS — BP 142/90 | HR 89 | Temp 98.2°F | Ht 61.0 in | Wt 112.0 lb

## 2014-12-05 DIAGNOSIS — I313 Pericardial effusion (noninflammatory): Secondary | ICD-10-CM

## 2014-12-05 DIAGNOSIS — R87810 Cervical high risk human papillomavirus (HPV) DNA test positive: Secondary | ICD-10-CM | POA: Diagnosis present

## 2014-12-05 DIAGNOSIS — N760 Acute vaginitis: Secondary | ICD-10-CM | POA: Diagnosis present

## 2014-12-05 DIAGNOSIS — I319 Disease of pericardium, unspecified: Secondary | ICD-10-CM

## 2014-12-05 DIAGNOSIS — I3139 Other pericardial effusion (noninflammatory): Secondary | ICD-10-CM

## 2014-12-05 DIAGNOSIS — Z113 Encounter for screening for infections with a predominantly sexual mode of transmission: Secondary | ICD-10-CM | POA: Diagnosis present

## 2014-12-05 DIAGNOSIS — E785 Hyperlipidemia, unspecified: Secondary | ICD-10-CM

## 2014-12-05 DIAGNOSIS — Z1151 Encounter for screening for human papillomavirus (HPV): Secondary | ICD-10-CM | POA: Insufficient documentation

## 2014-12-05 DIAGNOSIS — Z20828 Contact with and (suspected) exposure to other viral communicable diseases: Secondary | ICD-10-CM

## 2014-12-05 DIAGNOSIS — Z124 Encounter for screening for malignant neoplasm of cervix: Secondary | ICD-10-CM

## 2014-12-05 DIAGNOSIS — Z202 Contact with and (suspected) exposure to infections with a predominantly sexual mode of transmission: Secondary | ICD-10-CM

## 2014-12-05 DIAGNOSIS — R7982 Elevated C-reactive protein (CRP): Secondary | ICD-10-CM

## 2014-12-05 DIAGNOSIS — D649 Anemia, unspecified: Secondary | ICD-10-CM

## 2014-12-05 DIAGNOSIS — Z0000 Encounter for general adult medical examination without abnormal findings: Secondary | ICD-10-CM

## 2014-12-05 DIAGNOSIS — Z01419 Encounter for gynecological examination (general) (routine) without abnormal findings: Secondary | ICD-10-CM | POA: Insufficient documentation

## 2014-12-05 DIAGNOSIS — N898 Other specified noninflammatory disorders of vagina: Secondary | ICD-10-CM

## 2014-12-05 LAB — CBC WITH DIFFERENTIAL/PLATELET
Basophils Absolute: 0 10*3/uL (ref 0.0–0.1)
Basophils Relative: 0 % (ref 0–1)
Eosinophils Absolute: 0.1 10*3/uL (ref 0.0–0.7)
Eosinophils Relative: 3 % (ref 0–5)
HCT: 40.7 % (ref 36.0–46.0)
HEMOGLOBIN: 13.5 g/dL (ref 12.0–15.0)
LYMPHS PCT: 39 % (ref 12–46)
Lymphs Abs: 1.9 10*3/uL (ref 0.7–4.0)
MCH: 29.3 pg (ref 26.0–34.0)
MCHC: 33.2 g/dL (ref 30.0–36.0)
MCV: 88.3 fL (ref 78.0–100.0)
MPV: 9.7 fL (ref 8.6–12.4)
Monocytes Absolute: 0.4 10*3/uL (ref 0.1–1.0)
Monocytes Relative: 8 % (ref 3–12)
NEUTROS PCT: 50 % (ref 43–77)
Neutro Abs: 2.5 10*3/uL (ref 1.7–7.7)
PLATELETS: 355 10*3/uL (ref 150–400)
RBC: 4.61 MIL/uL (ref 3.87–5.11)
RDW: 16.1 % — AB (ref 11.5–15.5)
WBC: 4.9 10*3/uL (ref 4.0–10.5)

## 2014-12-05 LAB — POCT SEDIMENTATION RATE: POCT SED RATE: 5 mm/hr (ref 0–22)

## 2014-12-05 NOTE — Progress Notes (Signed)
Patient ID: Angela Serrano, female   DOB: 06-07-1961, 54 y.o.   MRN: [id number] Subjective:   CC: Pap smear  HPI:   Patient presented today just for Pap smear, but she is due for a well woman exam.  Health maintenance Concerns today:   none Colonoscopy: had 09/2014 and normal, recommended repeat in 10 years. Mammogram: 10/2014, normal recommended repeat in 1 year. Sexually active: No STD check: Requested Pap smears: due today Pelvic symptoms: None Lipids: Checked 09/2014 while hospitalized,  Smoking status: Current every day smoker Alcohol: Did not discuss today, 2 beers daily at 09/2014 visit. Depression screen: 0 phq-2 today TDAP, Zostavax, pneumococcal, influenza: TDAP 2013. Review of weight: 112 lbs up from 108 in Feb, pt feels she is gaining. Eating 3 meals (though small) daily. BP: Does not monitor at home. A1c: Prediabetic early 2016.  Hospital follow up On chart review, patient was also never seen for hospital follow-up. She has recently been hospitalized twice, the second time due to presumed pneumonia. Through hospitalizations, it was found that she was anemic with elevated ESR and CRP. She is currently feeling better, and does not report any fevers or trouble breathing.   Review of Systems - Per HPI.   PMH: Acute pericarditis history, anemia, elevated CRP, elevated blood pressure, GERD, hematuria, tobacco abuse, recent weight loss Smoking status: Current every day smoker    Objective:  Physical Exam BP 142/90 mmHg  Pulse 89  Temp(Src) 98.2 F (36.8 C) (Oral)  Ht $R'5\' 1"'OY$  (1.549 m)  Wt 112 lb (50.803 kg)  BMI 21.17 kg/m2  LMP 04/27/2013 GEN: NAD CV: RRR, no m/r/g PULM: Normal effort, CTAB NEURO: Awake, alert, no focal deficit, normal speech SKIN: No rash or cyanosis GU: Stenotic cervix, normal vagina, normal bimanual with normal size and mobility of uterus and adnexae    Assessment:     Angela Serrano is a 54 y.o. female here for health maintenance visit.    Plan:      # See problem list and after visit summary for problem-specific plans.   Follow-up: Follow up in 3 months for routine visit or sooner PRN.    Hilton Sinclair, MD [place]

## 2014-12-05 NOTE — Assessment & Plan Note (Signed)
Hospitalized 09/2014 for pneumonia; treated and patient is not reporting any symptoms currently, clear lung exam, normal effort today, normal vital signs. CT abdomen pelvis negative. Some concern of inflammatory condition or cancer at prior hospitalization due to recurrent fever,trivial pericardial effusion, ESR and CRP elevated, SPEP pattern of nonspecific inflammation,  RF elevated at 34, thrombocytosis, anemia, dysphagia, weight loss (improving). HIV neg, TSH normal, and UA at prior office visit with blood.  -Will repeat CBC, ESR, CRP today - Due for echo late Feb repeat. Saw cardiology mid-feb but not organized. Will order.  - f/u in 3 months at which time will repeat UA (not today as did pap smear, do not want to erroneously have large hgb).

## 2014-12-05 NOTE — Patient Instructions (Signed)
It is good to see you today. You got a pap smear today. We are doing some follow-up labs from your hospitalization. I will call you if any of these are not normal. Continue to work on quitting smoking as this will be very important for your health. Continue to work on eating 3 meals per day. Follow-up with me in 3 months or sooner if needed.

## 2014-12-05 NOTE — Assessment & Plan Note (Signed)
Health Maintenance visit - Pap smear today; HIV, RPR, gc/chlamydia screening - Colonoscopy in 10 years - Mammogram in 1 year - 10 year ASCVD risk 7.3%. - would benefit from moderate intensity statin. Will call pt with recs.  - will need f/u for BP (elevated today) and mild prediabetes A1c 5.7. - Precontemplational to quit, discussed importance of cessation and f/u if she desired support - Discussed previously keeping alcohol intake to minimum - Due for flu shot - will inform to make nurse visit for this - Advanced directives - pamphlet about this given today. - Follow-up in 3 months or sooner as needed.

## 2014-12-05 NOTE — Telephone Encounter (Addendum)
Blue team, please call her to assist in scheduling echocardiogram.  (Left message on voicemail for patient to call back. When she does, let her know the following. I am happy to answer any questions she has. None of this information is a major change to current plan except rechecking BP and starting statin. However, since it is lots of information, I will also send a letter stating the above.).  - Pt needs follow up echocardiogram scheduled due to January finding of trivial pericardial effusion. I have ordered this and Blue Team will help schedule.  - She also would benefit from starting a moderate intensity statin based on lipids collected in hospital. These can cause SE of muscle aches. If she would like this, I can send it in.  - She needs BP followed up. She can get this at nurse visit.  - HIV, RPR, GC/Chlamydia/trichomonas/candida negative at our visit.  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 - CBC with diff normal (hgb up from 9.6 to 13.5) except mild elevated RDW.  - Following up on inflammatory markers: CRP (2.9) normal. ESR canceled.   Hilton Sinclair, MD

## 2014-12-06 LAB — CERVICOVAGINAL ANCILLARY ONLY
Chlamydia: NEGATIVE
Neisseria Gonorrhea: NEGATIVE
Trichomonas: NEGATIVE

## 2014-12-06 LAB — HIV ANTIBODY (ROUTINE TESTING W REFLEX): HIV: NONREACTIVE

## 2014-12-06 LAB — CYTOLOGY - PAP

## 2014-12-06 LAB — HIGH SENSITIVITY CRP: CRP, High Sensitivity: 2.9 mg/L

## 2014-12-06 LAB — RPR

## 2014-12-07 LAB — CERVICOVAGINAL ANCILLARY ONLY: Candida vaginitis: NEGATIVE

## 2014-12-24 ENCOUNTER — Encounter: Payer: Self-pay | Admitting: *Deleted

## 2014-12-24 NOTE — Telephone Encounter (Signed)
No authorization is required per [facility] for 2D echo.  Will get this scheduled once patient calls [place] back. Jazmin Hartsell,CMA

## 2014-12-24 NOTE — Telephone Encounter (Signed)
LM for patient to call back. Keary Waterson,CMA  

## 2014-12-24 NOTE — Progress Notes (Signed)
This statement came up after trying to get PA for a 2D echo.    This member's plan does not currently require notification or prior-authorization through the [place] Notification or Prior-Authorization Program.  Please contact a Customer Care Professional at [phone] if you believe the information returned to be in error.

## 2014-12-28 ENCOUNTER — Encounter: Payer: Self-pay | Admitting: Gastroenterology

## 2015-01-10 ENCOUNTER — Encounter: Payer: Self-pay | Admitting: *Deleted

## 2015-01-10 ENCOUNTER — Telehealth: Payer: Self-pay | Admitting: Family Medicine

## 2015-01-10 MED ORDER — ATORVASTATIN CALCIUM 20 MG PO TABS: 20.0000 mg | ORAL_TABLET | Freq: Every day | ORAL | Status: DC

## 2015-01-10 NOTE — Addendum Note (Signed)
Addended by: Conni Slipper T on: 01/10/2015 01:55 PM   Modules accepted: Orders

## 2015-01-10 NOTE — Telephone Encounter (Addendum)
Atorvastatin 20mg  daily sent to patient's pharmacy. Thank you Jazmin!  Hilton Sinclair, MD

## 2015-01-10 NOTE — Telephone Encounter (Signed)
LM for patient to call back.  Will mail letter asking her to contact our office regarding labs. Jazmin Hartsell,CMA

## 2015-01-10 NOTE — Telephone Encounter (Signed)
Retirning Jazmin's call / thanks [employer], ASA

## 2015-01-10 NOTE — Telephone Encounter (Signed)
Patient returned call and is aware of results.  She is agreeable to starting a statin for her cholesterol.  She is also scheduled for 01/11/2015 for repeat BP and 01/16/15 for 2D Echo. Alba Kriesel,CMA

## 2015-01-11 ENCOUNTER — Ambulatory Visit (INDEPENDENT_AMBULATORY_CARE_PROVIDER_SITE_OTHER): Payer: 59 | Admitting: *Deleted

## 2015-01-11 VITALS — BP 151/90 | HR 85

## 2015-01-11 DIAGNOSIS — R030 Elevated blood-pressure reading, without diagnosis of hypertension: Secondary | ICD-10-CM

## 2015-01-11 DIAGNOSIS — IMO0001 Reserved for inherently not codable concepts without codable children: Secondary | ICD-10-CM | POA: Insufficient documentation

## 2015-01-11 NOTE — Progress Notes (Signed)
Thank you. Please make sure she has follow up with me so we can discuss antihypertensive which she will likely need.  Hilton Sinclair, MD

## 2015-01-11 NOTE — Progress Notes (Signed)
Patient here today for BP check.  BP--151/90 (L) & P--85.  Denies any headache, dizziness, or blurred vision.  Currently not on BP med.  Patient given appt card for 2-D echo.  Will route to Dr. Dianah Field for advice.  Angela Serrano, BSN, RN-BC

## 2015-01-16 ENCOUNTER — Ambulatory Visit (HOSPITAL_COMMUNITY)
Admission: RE | Admit: 2015-01-16 | Discharge: 2015-01-16 | Disposition: A | Discharge status: Home / Self Care | Payer: 59 | Source: Ambulatory Visit | Attending: Family Medicine | Admitting: Family Medicine

## 2015-01-16 DIAGNOSIS — I3139 Other pericardial effusion (noninflammatory): Secondary | ICD-10-CM

## 2015-01-16 DIAGNOSIS — I313 Pericardial effusion (noninflammatory): Secondary | ICD-10-CM

## 2015-01-16 DIAGNOSIS — I319 Disease of pericardium, unspecified: Secondary | ICD-10-CM | POA: Insufficient documentation

## 2015-01-16 NOTE — Progress Notes (Signed)
  Echocardiogram 2D Echocardiogram has been performed.  Johny Chess 01/16/2015, 9:58 AM

## 2015-01-17 ENCOUNTER — Encounter: Payer: Self-pay | Admitting: Family Medicine

## 2015-01-17 ENCOUNTER — Telehealth: Payer: Self-pay | Admitting: Family Medicine

## 2015-01-17 DIAGNOSIS — I509 Heart failure, unspecified: Secondary | ICD-10-CM

## 2015-01-17 DIAGNOSIS — I5020 Unspecified systolic (congestive) heart failure: Secondary | ICD-10-CM

## 2015-01-17 HISTORY — DX: Heart failure, unspecified: I50.9

## 2015-01-17 MED ORDER — LISINOPRIL 5 MG PO TABS: 5.0000 mg | ORAL_TABLET | Freq: Every day | ORAL | Status: DC

## 2015-01-17 NOTE — Telephone Encounter (Addendum)
Called to discuss echo results with patient. We had repeated this due to trivial pericardial effusion at last check. No further effusion seen; however, LV systolic function mildly reduced, 45-50%, grade 1 DD, mild diffuse hypokinesis. Aortic valve trivial regurgitation. Relatively normal echo except for mildly decreased LV systolic function. Precepted, and will start low-dose lisinopril 5mg  daily and see patient back in 2-3 weeks for follow up. Notified of side effects of cough/angioedema to keep eye out for that are rare. At f/u, consider starting metoprolol. Pt voiced understanding.   Hilton Sinclair, MD

## 2015-04-12 ENCOUNTER — Other Ambulatory Visit: Payer: Self-pay | Admitting: Family Medicine

## 2019-12-25 ENCOUNTER — Other Ambulatory Visit: Payer: Self-pay

## 2019-12-25 ENCOUNTER — Ambulatory Visit (INDEPENDENT_AMBULATORY_CARE_PROVIDER_SITE_OTHER): Payer: 59 | Admitting: Family Medicine

## 2019-12-25 ENCOUNTER — Encounter: Payer: Self-pay | Admitting: Family Medicine

## 2019-12-25 VITALS — BP 140/64 | HR 87 | Ht 61.0 in | Wt 133.2 lb

## 2019-12-25 DIAGNOSIS — Z13220 Encounter for screening for lipoid disorders: Secondary | ICD-10-CM

## 2019-12-25 DIAGNOSIS — Z0000 Encounter for general adult medical examination without abnormal findings: Secondary | ICD-10-CM

## 2019-12-25 DIAGNOSIS — I5020 Unspecified systolic (congestive) heart failure: Secondary | ICD-10-CM

## 2019-12-25 DIAGNOSIS — Z131 Encounter for screening for diabetes mellitus: Secondary | ICD-10-CM | POA: Diagnosis not present

## 2019-12-25 DIAGNOSIS — Z807 Family history of other malignant neoplasms of lymphoid, hematopoietic and related tissues: Secondary | ICD-10-CM

## 2019-12-25 DIAGNOSIS — Z720 Tobacco use: Secondary | ICD-10-CM

## 2019-12-25 DIAGNOSIS — K2100 Gastro-esophageal reflux disease with esophagitis, without bleeding: Secondary | ICD-10-CM

## 2019-12-25 DIAGNOSIS — I10 Essential (primary) hypertension: Secondary | ICD-10-CM | POA: Diagnosis not present

## 2019-12-25 DIAGNOSIS — Z1159 Encounter for screening for other viral diseases: Secondary | ICD-10-CM

## 2019-12-25 DIAGNOSIS — R7982 Elevated C-reactive protein (CRP): Secondary | ICD-10-CM

## 2019-12-25 DIAGNOSIS — Z1231 Encounter for screening mammogram for malignant neoplasm of breast: Secondary | ICD-10-CM | POA: Diagnosis not present

## 2019-12-25 DIAGNOSIS — I5022 Chronic systolic (congestive) heart failure: Secondary | ICD-10-CM

## 2019-12-25 LAB — POCT GLYCOSYLATED HEMOGLOBIN (HGB A1C): Hemoglobin A1C: 5.4 % (ref 4.0–5.6)

## 2019-12-25 NOTE — Patient Instructions (Signed)
It was a pleasure meeting you! We did a lot today, and I would like you to come back in 2 weeks to follow up on the lab work and to check your blood pressure.  1. I ordered an echocardiogram to look at your heart.  2. I will call you with any abnormal blood work and we will discuss it further at your follow up appointment.  Be Well!   Steps to Quit Smoking Smoking tobacco is the leading cause of preventable death. It can affect almost every organ in the body. Smoking puts you and people around you at risk for many serious, long-lasting (chronic) diseases. Quitting smoking can be hard, but it is one of the best things that you can do for your health. It is never too late to quit. How do I get ready to quit? When you decide to quit smoking, make a plan to help you succeed. Before you quit:  Pick a date to quit. Set a date within the next 2 weeks to give you time to prepare.  Write down the reasons why you are quitting. Keep this list in places where you will see it often.  Tell your family, friends, and co-workers that you are quitting. Their support is important.  Talk with your doctor about the choices that may help you quit.  Find out if your health insurance will pay for these treatments.  Know the people, places, things, and activities that make you want to smoke (triggers). Avoid them. What first steps can I take to quit smoking?  Throw away all cigarettes at home, at work, and in your car.  Throw away the things that you use when you smoke, such as ashtrays and lighters.  Clean your car. Make sure to empty the ashtray.  Clean your home, including curtains and carpets. What can I do to help me quit smoking? Talk with your doctor about taking medicines and seeing a counselor at the same time. You are more likely to succeed when you do both.  If you are pregnant or breastfeeding, talk with your doctor about counseling or other ways to quit smoking. Do not take medicine to help  you quit smoking unless your doctor tells you to do so. To quit smoking: Quit right away  Quit smoking totally, instead of slowly cutting back on how much you smoke over a period of time.  Go to counseling. You are more likely to quit if you go to counseling sessions regularly. Take medicine You may take medicines to help you quit. Some medicines need a prescription, and some you can buy over-the-counter. Some medicines may contain a drug called nicotine to replace the nicotine in cigarettes. Medicines may:  Help you to stop having the desire to smoke (cravings).  Help to stop the problems that come when you stop smoking (withdrawal symptoms). Your doctor may ask you to use:  Nicotine patches, gum, or lozenges.  Nicotine inhalers or sprays.  Non-nicotine medicine that is taken by mouth. Find resources Find resources and other ways to help you quit smoking and remain smoke-free after you quit. These resources are most helpful when you use them often. They include:  Online chats with a Social worker.  Phone quitlines.  Printed Furniture conservator/restorer.  Support groups or group counseling.  Text messaging programs.  Mobile phone apps. Use apps on your mobile phone or tablet that can help you stick to your quit plan. There are many free apps for mobile phones and tablets as well  as websites. Examples include Quit Guide from the [employer] and smokefree.gov  What things can I do to make it easier to quit?   Talk to your family and friends. Ask them to support and encourage you.  Call a phone quitline (1-800-QUIT-NOW), reach out to support groups, or work with a Social worker.  Ask people who smoke to not smoke around you.  Avoid places that make you want to smoke, such as: ? Bars. ? Parties. ? Smoke-break areas at work.  Spend time with people who do not smoke.  Lower the stress in your life. Stress can make you want to smoke. Try these things to help your stress: ? Getting regular  exercise. ? Doing deep-breathing exercises. ? Doing yoga. ? Meditating. ? Doing a body scan. To do this, close your eyes, focus on one area of your body at a time from head to toe. Notice which parts of your body are tense. Try to relax the muscles in those areas. How will I feel when I quit smoking? Day 1 to 3 weeks Within the first 24 hours, you may start to have some problems that come from quitting tobacco. These problems are very bad 2-3 days after you quit, but they do not often last for more than 2-3 weeks. You may get these symptoms:  Mood swings.  Feeling restless, nervous, angry, or annoyed.  Trouble concentrating.  Dizziness.  Strong desire for high-sugar foods and nicotine.  Weight gain.  Trouble pooping (constipation).  Feeling like you may vomit (nausea).  Coughing or a sore throat.  Changes in how the medicines that you take for other issues work in your body.  Depression.  Trouble sleeping (insomnia). Week 3 and afterward After the first 2-3 weeks of quitting, you may start to notice more positive results, such as:  Better sense of smell and taste.  Less coughing and sore throat.  Slower heart rate.  Lower blood pressure.  Clearer skin.  Better breathing.  Fewer sick days. Quitting smoking can be hard. Do not give up if you fail the first time. Some people need to try a few times before they succeed. Do your best to stick to your quit plan, and talk with your doctor if you have any questions or concerns. Summary  Smoking tobacco is the leading cause of preventable death. Quitting smoking can be hard, but it is one of the best things that you can do for your health.  When you decide to quit smoking, make a plan to help you succeed.  Quit smoking right away, not slowly over a period of time.  When you start quitting, seek help from your doctor, family, or friends. This information is not intended to replace advice given to you by your health  care provider. Make sure you discuss any questions you have with your health care provider. Document Revised: 06/02/2019 Document Reviewed: 11/26/2018 Elsevier Patient Education  [employer].

## 2019-12-25 NOTE — Progress Notes (Signed)
SUBJECTIVE:   CHIEF COMPLAINT / HPI: establishing care  PMH: Patient denies any diagnoses, however on chart review, patient has CHF with reduced EF 45-50%. She states that she has GERD and does not have chest pain, does report that she previously has had dysphagia and believes that she sometimes has a funny feeling in her chest due to heart burn/dysphagia, which she is not currently experiencing. Per chart review she also has a history of persistently elevated CRP/ESR.  PSH: bilateral tubal ligation 1990  Social History: has 3 daughters in their 21s, lives with one of her daughters and 2 of her grandchildren. She has smoked 0.25 ppd of cigarettes x 30 years. Has tried to quit in the past, currently considering quitting. Would prefer to use gum/patch combo. Previously has used chantix, but did not like using it due to side effects, would not like to use that or wellbutrin. Patient drinks 2 beers per day, and goes walking or dancing 5 x per week.  Family history: multiple myeloma in mother and aunt.   PERTINENT  PMH / PSH: CHF, elevated CRP/ESR, GERD  OBJECTIVE:   BP 140/64 Comment: provider informed  Pulse 87   Ht 5' 1" (1.549 m)   Wt 133 lb 4 oz (60.4 kg)   LMP 04/27/2013 Comment: Perimenapausal   SpO2 98%   BMI 25.18 kg/m   Physical Exam Constitutional:      General: She is not in acute distress.    Appearance: Normal appearance. She is normal weight. She is not ill-appearing or toxic-appearing.  HENT:     Head: Normocephalic and atraumatic.     Nose: Nose normal.     Mouth/Throat:     Mouth: Mucous membranes are moist.     Pharynx: Oropharynx is clear.  Eyes:     Conjunctiva/sclera: Conjunctivae normal.  Cardiovascular:     Rate and Rhythm: Normal rate and regular rhythm.     Pulses: Normal pulses.     Heart sounds: Normal heart sounds. No murmur. No friction rub. No gallop.   Pulmonary:     Effort: Pulmonary effort is normal.     Breath sounds: Normal breath sounds.  No wheezing, rhonchi or rales.  Abdominal:     General: Abdomen is flat. Bowel sounds are normal.     Palpations: Abdomen is soft.     Tenderness: There is no abdominal tenderness. There is no guarding.  Musculoskeletal:        General: Normal range of motion.     Cervical back: Normal range of motion.  Skin:    General: Skin is warm and dry.     Capillary Refill: Capillary refill takes less than 2 seconds.  Neurological:     General: No focal deficit present.     Mental Status: She is alert and oriented to person, place, and time. Mental status is at baseline.  Psychiatric:        Mood and Affect: Mood normal.        Behavior: Behavior normal.    ASSESSMENT/PLAN:  Angela Serrano is a 59 yo woman with elevated BP, CHF, GERD.  Elevated blood pressure Patient with mildly elevated BP 140/70 today. Will continue to monitor in 2 weeks at follow up visit.  Health care maintenance Health Maintenance - Pap smear due this year - Hep C obtained today -Colonoscopy in 5 years -Mammogram ordered -Lipid panel obtained to assess hyperlipidemia -A1c obtained today to screen for diabetes -Will give patient option of CT  screen for lung cancer given long smoking history -Follow up in 2 weeks  Tobacco use Patient currently in precontemplation stage. Discussed importance of cessation. 1-800 quit line given, will check in on readiness to quit at each visit, offer support  CHF (congestive heart failure), NYHA class I Previous echo with LVEF 45-50%, G1DD, diffuse hypokinesis. Patient with occasional funny beats (not presently happening). Refer for repeat echo, no follow up since last echo in 2016.  CRP elevated Patient w/ no work up since 2016. Strong family history of MM.  - CBC, CRP, ESR ordered, per previous notes and strong family hx, SPEP ordered.  GERD (gastroesophageal reflux disease) Patient has not used PPI or any OTC GERD treatment. Discussed feeling of dysphagia, previous EGD with  dilation and mild gastritis, no evidence for cause of dysphagia. Assessing echo first, plan to follow up in 2 weeks and assess symptms at that time, if worse or no change can consider famotidine first and/or referral to GI.    Angela Damme, MD [place]

## 2019-12-25 NOTE — Assessment & Plan Note (Signed)
Patient with mildly elevated BP 140/70 today. Will continue to monitor in 2 weeks at follow up visit.

## 2019-12-26 ENCOUNTER — Encounter: Payer: Self-pay | Admitting: Family Medicine

## 2019-12-26 LAB — HEPATITIS C ANTIBODY: Hep C Virus Ab: <0.1 s/co ratio (ref 0.0–0.9)

## 2019-12-26 NOTE — Assessment & Plan Note (Addendum)
Health Maintenance - Pap smear due this year - Hep C obtained today -Colonoscopy in 5 years -Mammogram ordered -Lipid panel obtained to assess hyperlipidemia -A1c obtained today to screen for diabetes -Will give patient option of CT screen for lung cancer given long smoking history -Follow up in 2 weeks

## 2019-12-26 NOTE — Assessment & Plan Note (Signed)
Previous echo with LVEF 45-50%, G1DD, diffuse hypokinesis. Patient with occasional funny beats (not presently happening). Refer for repeat echo, no follow up since last echo in 2016.

## 2019-12-26 NOTE — Assessment & Plan Note (Signed)
Patient has not used PPI or any OTC GERD treatment. Discussed feeling of dysphagia, previous EGD with dilation and mild gastritis, no evidence for cause of dysphagia. Assessing echo first, plan to follow up in 2 weeks and assess symptms at that time, if worse or no change can consider famotidine first and/or referral to GI.

## 2019-12-26 NOTE — Assessment & Plan Note (Addendum)
Patient currently in precontemplation stage. Discussed importance of cessation. 1-800 quit line given, will check in on readiness to quit at each visit, offer support

## 2019-12-26 NOTE — Assessment & Plan Note (Addendum)
Patient w/ no work up since 2016. Strong family history of MM.  - CBC, CRP, ESR ordered, per previous notes and strong family hx, SPEP ordered.

## 2019-12-27 ENCOUNTER — Ambulatory Visit (HOSPITAL_COMMUNITY)
Admission: RE | Admit: 2019-12-27 | Discharge: 2019-12-27 | Disposition: A | Discharge status: Home / Self Care | Payer: 59 | Source: Ambulatory Visit | Attending: Family Medicine | Admitting: Family Medicine

## 2019-12-27 ENCOUNTER — Other Ambulatory Visit: Payer: Self-pay

## 2019-12-27 DIAGNOSIS — I5021 Acute systolic (congestive) heart failure: Secondary | ICD-10-CM | POA: Diagnosis present

## 2019-12-27 DIAGNOSIS — I5020 Unspecified systolic (congestive) heart failure: Secondary | ICD-10-CM | POA: Diagnosis not present

## 2019-12-27 DIAGNOSIS — I082 Rheumatic disorders of both aortic and tricuspid valves: Secondary | ICD-10-CM | POA: Insufficient documentation

## 2019-12-27 DIAGNOSIS — Z720 Tobacco use: Secondary | ICD-10-CM | POA: Diagnosis not present

## 2019-12-27 NOTE — Progress Notes (Signed)
  Echocardiogram 2D Echocardiogram has been performed.  Nilo Fallin A Natascha Edmonds 12/27/2019, 4:00 PM

## 2019-12-28 LAB — CBC WITH DIFFERENTIAL/PLATELET
Basophils Absolute: 0 10*3/uL (ref 0.0–0.2)
Basos: 1 %
EOS (ABSOLUTE): 0.2 10*3/uL (ref 0.0–0.4)
Eos: 4 %
Hematocrit: 40.9 % (ref 34.0–46.6)
Hemoglobin: 14.3 g/dL (ref 11.1–15.9)
Immature Grans (Abs): 0 10*3/uL (ref 0.0–0.1)
Immature Granulocytes: 0 %
Lymphocytes Absolute: 2 10*3/uL (ref 0.7–3.1)
Lymphs: 29 %
MCH: 31.9 pg (ref 26.6–33.0)
MCHC: 35 g/dL (ref 31.5–35.7)
MCV: 91 fL (ref 79–97)
Monocytes Absolute: 0.5 10*3/uL (ref 0.1–0.9)
Monocytes: 7 %
Neutrophils Absolute: 4.1 10*3/uL (ref 1.4–7.0)
Neutrophils: 59 %
Platelets: 327 10*3/uL (ref 150–450)
RBC: 4.48 x10E6/uL (ref 3.77–5.28)
RDW: 11.8 % (ref 11.7–15.4)
WBC: 6.8 10*3/uL (ref 3.4–10.8)

## 2019-12-28 LAB — BASIC METABOLIC PANEL
BUN/Creatinine Ratio: 28 — ABNORMAL HIGH (ref 9–23)
BUN: 18 mg/dL (ref 6–24)
CO2: 21 mmol/L (ref 20–29)
Calcium: 9.1 mg/dL (ref 8.7–10.2)
Chloride: 108 mmol/L — ABNORMAL HIGH (ref 96–106)
Creatinine, Ser: 0.64 mg/dL (ref 0.57–1.00)
GFR calc Af Amer: 115 mL/min/{1.73_m2} (ref >59)
GFR calc non Af Amer: 100 mL/min/{1.73_m2} (ref >59)
Glucose: 81 mg/dL (ref 65–99)
Potassium: 4.5 mmol/L (ref 3.5–5.2)
Sodium: 144 mmol/L (ref 134–144)

## 2019-12-28 LAB — PROTEIN ELECTROPHORESIS, SERUM
A/G Ratio: 1.4 (ref 0.7–1.7)
Albumin ELP: 3.9 g/dL (ref 2.9–4.4)
Alpha 1: 0.2 g/dL (ref 0.0–0.4)
Alpha 2: 0.7 g/dL (ref 0.4–1.0)
Beta: 0.9 g/dL (ref 0.7–1.3)
Gamma Globulin: 0.9 g/dL (ref 0.4–1.8)
Globulin, Total: 2.8 g/dL (ref 2.2–3.9)
Total Protein: 6.7 g/dL (ref 6.0–8.5)

## 2019-12-28 LAB — LIPID PANEL
Chol/HDL Ratio: 1.9 ratio (ref 0.0–4.4)
Cholesterol, Total: 202 mg/dL — ABNORMAL HIGH (ref 100–199)
HDL: 109 mg/dL (ref >39)
LDL Chol Calc (NIH): 82 mg/dL (ref 0–99)
Triglycerides: 58 mg/dL (ref 0–149)
VLDL Cholesterol Cal: 11 mg/dL (ref 5–40)

## 2019-12-28 LAB — SEDIMENTATION RATE: Sed Rate: 2 mm/hr (ref 0–40)

## 2019-12-28 LAB — C-REACTIVE PROTEIN: CRP: 3 mg/L (ref 0–10)

## 2020-01-01 ENCOUNTER — Ambulatory Visit
Admission: RE | Admit: 2020-01-01 | Discharge: 2020-01-01 | Disposition: A | Discharge status: Home / Self Care | Payer: 59 | Source: Ambulatory Visit | Attending: Family Medicine | Admitting: Family Medicine

## 2020-01-01 ENCOUNTER — Other Ambulatory Visit: Payer: Self-pay

## 2020-01-01 DIAGNOSIS — Z1231 Encounter for screening mammogram for malignant neoplasm of breast: Secondary | ICD-10-CM

## 2020-01-03 ENCOUNTER — Telehealth: Payer: Self-pay | Admitting: Family Medicine

## 2020-01-03 NOTE — Telephone Encounter (Signed)
Called 1.5 weeks ago to make sure patient had follow up appt scheduled, which she does on 4/19. Spoke with patient. We will discuss lab work and results of echo at that time.  Gladys Damme, MD [facility], PGY-1

## 2020-01-03 NOTE — Telephone Encounter (Signed)
Patient says she got a cal from [place] but no VM was left. She's not sure if it was Chauncey Reading trying to get in touch with her.

## 2020-01-03 NOTE — Telephone Encounter (Signed)
Will forward to MD who called patient but was unable to leave a detailed message.  Alysha Doolan,CMA

## 2020-01-08 ENCOUNTER — Other Ambulatory Visit: Payer: Self-pay

## 2020-01-08 ENCOUNTER — Ambulatory Visit (INDEPENDENT_AMBULATORY_CARE_PROVIDER_SITE_OTHER): Payer: 59 | Admitting: Family Medicine

## 2020-01-08 ENCOUNTER — Encounter: Payer: Self-pay | Admitting: Family Medicine

## 2020-01-08 VITALS — BP 130/70 | HR 101 | Ht 61.0 in | Wt 131.5 lb

## 2020-01-08 DIAGNOSIS — I5022 Chronic systolic (congestive) heart failure: Secondary | ICD-10-CM

## 2020-01-08 DIAGNOSIS — Z716 Tobacco abuse counseling: Secondary | ICD-10-CM

## 2020-01-08 DIAGNOSIS — Z720 Tobacco use: Secondary | ICD-10-CM | POA: Diagnosis not present

## 2020-01-08 MED ORDER — METOPROLOL TARTRATE 25 MG PO TABS: 12.5000 mg | ORAL_TABLET | Freq: Two times a day (BID) | ORAL | 3 refills | Status: DC

## 2020-01-08 NOTE — Progress Notes (Signed)
SUBJECTIVE:   CHIEF COMPLAINT / HPI: follow up re: labs and echo  Lab work is appropriate, much improved since last labs in 2016. No longer has elevated CRP, or sed rate, no MM.  Patient occasionally has shortness of breath, usually while working in the garden or exerting herself. Discussed with her that most recent echo has worsened and she now has G2DD heart failure w/ reduced EF, now worsened from 45-50% to 35-40%.  Discussed that most important change she can make in addition to taking her medications, continuing to be active and exercise, is to quit smoking. Reassessed patient's willingness to quit. She knows she should quit and has considered trying medications, but is not yet ready to try. She does not yet want to try chantix to see if it helps reduce her cravings prior to setting a quit date. Discussed wellbutrin, lozenges/gum, and patches as well. She will continue to consider her options.  PERTINENT  PMH / PSH: HFrEF NYHA class 2, tobacco abuse  OBJECTIVE:   BP 130/70   Pulse (!) 101   Ht 5\' 1"  (1.549 m)   Wt 131 lb 8 oz (59.6 kg)   LMP 04/27/2013 Comment: Perimenapausal   BMI 24.85 kg/m   Physical Exam Vitals and nursing note reviewed.  Constitutional:      General: She is not in acute distress.    Appearance: Normal appearance. She is normal weight. She is not ill-appearing, toxic-appearing or diaphoretic.  HENT:     Head: Normocephalic and atraumatic.     Nose: Nose normal.  Eyes:     Conjunctiva/sclera: Conjunctivae normal.  Cardiovascular:     Rate and Rhythm: Normal rate and regular rhythm.     Pulses: Normal pulses.     Heart sounds: No murmur. No friction rub. No gallop.   Pulmonary:     Effort: Pulmonary effort is normal.     Breath sounds: Normal breath sounds. No wheezing, rhonchi or rales.  Abdominal:     General: Abdomen is flat. Bowel sounds are normal. There is no distension.     Palpations: Abdomen is soft.     Tenderness: There is no abdominal  tenderness.  Musculoskeletal:     Cervical back: Neck supple.     Right lower leg: No edema.     Left lower leg: No edema.  Skin:    General: Skin is warm and dry.     Capillary Refill: Capillary refill takes less than 2 seconds.  Neurological:     General: No focal deficit present.     Mental Status: She is alert and oriented to person, place, and time. Mental status is at baseline.  Psychiatric:        Mood and Affect: Mood normal.        Behavior: Behavior normal.    ASSESSMENT/PLAN:  Angela Serrano is a 59 yo woman with HFrEF 35-40%, NYHA class 2, and tobacco abuse.  Congestive heart failure, NYHA class 2 ([place]) Most recent echo with diminished EF, now 35-40%, and G2DD. Previous echo in 2016 with 45-50% LVEF and G1DD. Patient is currently on lisinopril 5 mg. Weight is about 130lbs. No LE edema, she has not noticed swelling in the past, does not regularly weigh herself. Will initiate BB and refer to HF clinic to start relationship. Will hold of on diuretic until indication that patient has fluid overload, do not want to precipitate AKI/electrolyte imbalance. - Recommend daily morning weights at home - If weight increased by >5lbs,  call the office - Start metoprolol tartrate 12.5mg  BID - Continue lisinopril 5mg  qhs - Referral to Heart Failure clinic - Follow up 1 month to assess tolerance of medications  Tobacco use Counseled patient that single most important step to taking better care of heart (worsened HFrEF dx) is to quit smoking. Patient is still in contemplative phase, she would like to quit and is considering various options, but is not yet ready to take action. - Follow up in 1 month to assess smoking cessation readiness   Gladys Damme, MD [place]

## 2020-01-08 NOTE — Patient Instructions (Addendum)
It was a pleasure to see you today! We talked about several things:  1. Eat more vegetables to help lower total cholesterol  2. Continue exercise as you are able to tolerate it.  3. Plan to quit smoking: what methods would you like to try?  4. You have the start of heart failure:    - weigh yourself every morning    - take metoprolol twice daily (1/2 pill), if you feel dizzy, decrease to once daily and call the office [phone]    - I have referred you to the Heart Failure clinic, they will call you to schedule an appointment  Return in 1 month to check in on your progress and to see how you are tolerating the medications.  Be Well!  Dr. Chauncey Reading   Heart Failure Action Plan A heart failure action plan helps you understand what to do when you have symptoms of heart failure. Follow the plan that was created by you and your health care provider. Review your plan each time you visit your health care provider. Red zone These signs and symptoms mean you should get medical help right away:  You have trouble breathing when resting.  You have a dry cough that is getting worse.  You have swelling or pain in your legs or abdomen that is getting worse.  You suddenly gain more than 2-3 lb (0.9-1.4 kg) in a day, or more than 5 lb (2.3 kg) in one week. This amount may be more or less depending on your condition.  You have trouble staying awake or you feel confused.  You have chest pain.  You do not have an appetite.  You pass out. If you experience any of these symptoms:  Call your local emergency services (911 in the [place] right away or seek help at the emergency department of the nearest hospital. Yellow zone These signs and symptoms mean your condition may be getting worse and you should make some changes:  You have trouble breathing when you are active or you need to sleep with extra pillows.  You have swelling in your legs or abdomen.  You gain 2-3 lb (0.9-1.4 kg) in one  day, or 5 lb (2.3 kg) in one week. This amount may be more or less depending on your condition.  You get tired easily.  You have trouble sleeping.  You have a dry cough. If you experience any of these symptoms:  Contact your health care provider within the next day.  Your health care provider may adjust your medicines. Green zone These signs mean you are doing well and can continue what you are doing:  You do not have shortness of breath.  You have very little swelling or no new swelling.  Your weight is stable (no gain or loss).  You have a normal activity level.  You do not have chest pain or any other new symptoms. Follow these instructions at home:  Take over-the-counter and prescription medicines only as told by your health care provider.  Weigh yourself daily. Your target weight is ____131______ lb   Eat a heart-healthy diet. Work with a diet and nutrition specialist (dietitian) to create an eating plan that is best for you.  Keep all follow-up visits as told by your health care provider. This is important. Where to find more information  American Heart Association: www.heart.org Summary  Follow the action plan that was created by you and your health care provider.  Get help right away if you have  any symptoms in the Red zone. This information is not intended to replace advice given to you by your health care provider. Make sure you discuss any questions you have with your health care provider. Document Revised: 08/20/2017 Document Reviewed: 10/17/2016 Elsevier Patient Education  2020 [employer].

## 2020-01-08 NOTE — Assessment & Plan Note (Signed)
Counseled patient that single most important step to taking better care of heart (worsened HFrEF dx) is to quit smoking. Patient is still in contemplative phase, she would like to quit and is considering various options, but is not yet ready to take action. - Follow up in 1 month to assess smoking cessation readiness

## 2020-01-08 NOTE — Assessment & Plan Note (Addendum)
Most recent echo with diminished EF, now 35-40%, and G2DD. Previous echo in 2016 with 45-50% LVEF and G1DD. Patient is currently on lisinopril 5 mg. Weight is about 130lbs. No LE edema, she has not noticed swelling in the past, does not regularly weigh herself. Will initiate BB and refer to HF clinic to start relationship. Will hold of on diuretic until indication that patient has fluid overload, do not want to precipitate AKI/electrolyte imbalance. - Recommend daily morning weights at home - If weight increased by >5lbs, call the office - Start metoprolol tartrate 12.5mg  BID - Continue lisinopril 5mg  qhs - Referral to Heart Failure clinic - Follow up 1 month to assess tolerance of medications

## 2020-01-16 ENCOUNTER — Other Ambulatory Visit: Payer: Self-pay | Admitting: Family Medicine

## 2020-01-16 DIAGNOSIS — I5022 Chronic systolic (congestive) heart failure: Secondary | ICD-10-CM

## 2020-01-28 ENCOUNTER — Other Ambulatory Visit: Payer: Self-pay | Admitting: Family Medicine

## 2020-01-28 DIAGNOSIS — I5022 Chronic systolic (congestive) heart failure: Secondary | ICD-10-CM

## 2020-02-02 ENCOUNTER — Encounter: Payer: Self-pay | Admitting: Family Medicine

## 2020-02-02 ENCOUNTER — Other Ambulatory Visit: Payer: Self-pay

## 2020-02-02 ENCOUNTER — Ambulatory Visit (INDEPENDENT_AMBULATORY_CARE_PROVIDER_SITE_OTHER): Payer: 59 | Admitting: Family Medicine

## 2020-02-02 VITALS — BP 158/86 | HR 85 | Ht 61.0 in | Wt 133.8 lb

## 2020-02-02 DIAGNOSIS — E785 Hyperlipidemia, unspecified: Secondary | ICD-10-CM

## 2020-02-02 DIAGNOSIS — E782 Mixed hyperlipidemia: Secondary | ICD-10-CM

## 2020-02-02 DIAGNOSIS — I10 Essential (primary) hypertension: Secondary | ICD-10-CM

## 2020-02-02 DIAGNOSIS — I5022 Chronic systolic (congestive) heart failure: Secondary | ICD-10-CM | POA: Diagnosis not present

## 2020-02-02 DIAGNOSIS — Z720 Tobacco use: Secondary | ICD-10-CM

## 2020-02-02 MED ORDER — ATORVASTATIN CALCIUM 20 MG PO TABS: 20.0000 mg | ORAL_TABLET | Freq: Every day | ORAL | 3 refills | Status: DC

## 2020-02-02 MED ORDER — INDAPAMIDE 2.5 MG PO TABS: 2.5000 mg | ORAL_TABLET | Freq: Every day | ORAL | 3 refills | Status: DC

## 2020-02-02 NOTE — Patient Instructions (Addendum)
It was a pleasure seeing you today!  Good work on cutting back on cigarettes, keep it up and you will get there!  For your blood pressure and keeping fluid off please start taking indapamide 2.5 mg daily. The best time to take this is at night, but if you find you have to get up to pee more at night, it is ok to take it in the morning.  For your cholesterol and to reduce risk of stroke and heart attack, I recommend you start taking lipitor (also called atorvastatin). I am getting blood work of your liver function today and I will let you know if you should start taking it on Monday, I recommend waiting to pick it up until then.  Keep your appointment with the heart failure clinic, and I will see you back in 1 month.  Be Well!

## 2020-02-03 LAB — HEPATIC FUNCTION PANEL
ALT: 10 IU/L (ref 0–32)
AST: 14 IU/L (ref 0–40)
Albumin: 4.2 g/dL (ref 3.8–4.9)
Alkaline Phosphatase: 114 IU/L (ref 39–117)
Bilirubin Total: <0.2 mg/dL (ref 0.0–1.2)
Bilirubin, Direct: 0.05 mg/dL (ref 0.00–0.40)
Total Protein: 6.5 g/dL (ref 6.0–8.5)

## 2020-02-08 ENCOUNTER — Encounter: Payer: Self-pay | Admitting: Family Medicine

## 2020-02-08 DIAGNOSIS — E785 Hyperlipidemia, unspecified: Secondary | ICD-10-CM | POA: Insufficient documentation

## 2020-02-08 NOTE — Assessment & Plan Note (Signed)
Patient is cutting back on smoking, offered support for ultimate goal of cessation. Patient not interested in other medications at this time. Will continue to check in on cessation readiness.

## 2020-02-08 NOTE — Assessment & Plan Note (Addendum)
Patient tolerating metoprolol well, will continue. Trace LE edema present, weight up by 2 lbs. She has an appt with HF clinic on 5/25. At last appt, there was a miscommunication, patient is not currently taking lisinopril. Will hold off on starting ACEI/ARB at this time in anticipation of upcoming HF appointment, as they may start ARNI or ACEI/ARB at that time. If not, will then plan to add on appropriate medication at next visit.  -follow up 02/27/20 -Continue metoprolol tartrate 12.5mg  BID

## 2020-02-08 NOTE — Assessment & Plan Note (Addendum)
Patient previously on lisinopril, but has since stopped using the medication. Today SBP elevated to 150s. Will start indapamide 2.5mg  qhs and titrate up as needed.

## 2020-02-08 NOTE — Assessment & Plan Note (Signed)
Total cholesterol 202, LDL 86, ASCVD risk 10%. Patient has previously used atorvastatin without problem. Will obtain hepatic function panel today, if WNL, then will start atorvastatin 20mg  daily. -Hepatic function panel

## 2020-02-08 NOTE — Progress Notes (Signed)
    SUBJECTIVE:   CHIEF COMPLAINT / HPI: HF f/u  Patient reports that overall she has been doing well. She has been cutting back on smoking and working with her children who also smoke to make this a family goal. She is not currently interested in using Chantix or Wellbutrin, but she is using lozenges/gum/ and patches to decrease the amount of cigarettes she is smoking with the hope of stopping all together. We discussed also planning for a quit date, and she is not yet ready to set one at this time.   Patient has been using metoprolol without problem. No dizziness, lightheadedness. She reports that she has an appointment with HF clinic on 5/25. Patient wants to add one medication at a time so she doesn't get confused by starting multiple medications.  Patient was previously on atorvastatin in 2016, she had no problems taking that medication, but stopped taking it after she changed jobs and did not have insurance for a while.   PERTINENT  PMH / PSH: CHF, tobacco use, HTN  OBJECTIVE:   BP (!) 158/86   Pulse 85   Ht 5\' 1"  (1.549 m)   Wt 133 lb 12.8 oz (60.7 kg)   LMP 04/27/2013 Comment: Perimenapausal   SpO2 98%   BMI 25.28 kg/m   Physical Exam  Constitutional: She is oriented to person, place, and time and well-developed, well-nourished, and in no distress. No distress.  HENT:  Head: Normocephalic and atraumatic.  Eyes: Conjunctivae are normal.  Cardiovascular: Normal rate and regular rhythm. Exam reveals no gallop and no friction rub.  No murmur heard. Pulmonary/Chest: Effort normal and breath sounds normal. No respiratory distress. She has no wheezes. She has no rales.  Abdominal: Soft. Bowel sounds are normal. She exhibits no distension. There is no abdominal tenderness.  Musculoskeletal:        General: No edema.     Cervical back: Neck supple.  Neurological: She is alert and oriented to person, place, and time.  Skin: Skin is warm and dry. She is not diaphoretic.    Psychiatric: Mood and affect normal.  Nursing note and vitals reviewed.  ASSESSMENT/PLAN:  Ms. Centofanti is a 59 yo woman with CHF, HTN, and tobacco use  Congestive heart failure, NYHA class 2 ([place]) Patient tolerating metoprolol well, will continue. Trace LE edema present, weight up by 2 lbs. She has an appt with HF clinic on 5/25. At last appt, there was a miscommunication, patient is not currently taking lisinopril. Will hold off on starting ACEI/ARB at this time in anticipation of upcoming HF appointment, as they may start ARNI or ACEI/ARB at that time. If not, will then plan to add on appropriate medication at next visit.  -follow up 02/27/20 -Continue metoprolol tartrate 12.5mg  BID  Benign essential HTN Patient previously on lisinopril, but has since stopped using the medication. Today SBP elevated to 150s. Will start indapamide 2.5mg  qhs and titrate up as needed.   Tobacco use Patient is cutting back on smoking, offered support for ultimate goal of cessation. Patient not interested in other medications at this time. Will continue to check in on cessation readiness.  Hyperlipidemia Total cholesterol 202, LDL 86, ASCVD risk 10%. Patient has previously used atorvastatin without problem. Will obtain hepatic function panel today, if WNL, then will start atorvastatin 20mg  daily. -Hepatic function panel     Gladys Damme, MD [place]

## 2020-02-09 ENCOUNTER — Other Ambulatory Visit: Payer: Self-pay | Admitting: Family Medicine

## 2020-02-09 DIAGNOSIS — I5022 Chronic systolic (congestive) heart failure: Secondary | ICD-10-CM

## 2020-02-13 ENCOUNTER — Encounter (HOSPITAL_COMMUNITY): Payer: Self-pay | Admitting: Internal Medicine

## 2020-02-13 ENCOUNTER — Other Ambulatory Visit: Payer: Self-pay

## 2020-02-13 ENCOUNTER — Ambulatory Visit (HOSPITAL_COMMUNITY)
Admission: RE | Admit: 2020-02-13 | Discharge: 2020-02-13 | Disposition: A | Discharge status: Home / Self Care | Payer: 59 | Source: Ambulatory Visit | Attending: Internal Medicine | Admitting: Internal Medicine

## 2020-02-13 VITALS — BP 106/60 | HR 86 | Wt 132.0 lb

## 2020-02-13 DIAGNOSIS — I110 Hypertensive heart disease with heart failure: Secondary | ICD-10-CM | POA: Insufficient documentation

## 2020-02-13 DIAGNOSIS — R0789 Other chest pain: Secondary | ICD-10-CM | POA: Diagnosis not present

## 2020-02-13 DIAGNOSIS — K219 Gastro-esophageal reflux disease without esophagitis: Secondary | ICD-10-CM | POA: Diagnosis not present

## 2020-02-13 DIAGNOSIS — Z7901 Long term (current) use of anticoagulants: Secondary | ICD-10-CM | POA: Diagnosis not present

## 2020-02-13 DIAGNOSIS — Z7982 Long term (current) use of aspirin: Secondary | ICD-10-CM | POA: Insufficient documentation

## 2020-02-13 DIAGNOSIS — Z720 Tobacco use: Secondary | ICD-10-CM | POA: Diagnosis not present

## 2020-02-13 DIAGNOSIS — Z79899 Other long term (current) drug therapy: Secondary | ICD-10-CM | POA: Diagnosis not present

## 2020-02-13 DIAGNOSIS — F1010 Alcohol abuse, uncomplicated: Secondary | ICD-10-CM | POA: Diagnosis not present

## 2020-02-13 DIAGNOSIS — R0601 Orthopnea: Secondary | ICD-10-CM | POA: Diagnosis not present

## 2020-02-13 DIAGNOSIS — F17210 Nicotine dependence, cigarettes, uncomplicated: Secondary | ICD-10-CM | POA: Diagnosis not present

## 2020-02-13 DIAGNOSIS — R0602 Shortness of breath: Secondary | ICD-10-CM | POA: Diagnosis not present

## 2020-02-13 DIAGNOSIS — I5022 Chronic systolic (congestive) heart failure: Secondary | ICD-10-CM | POA: Diagnosis present

## 2020-02-13 LAB — BASIC METABOLIC PANEL
Anion gap: 13 (ref 5–15)
BUN: 24 mg/dL — ABNORMAL HIGH (ref 6–20)
CO2: 24 mmol/L (ref 22–32)
Calcium: 9.4 mg/dL (ref 8.9–10.3)
Chloride: 105 mmol/L (ref 98–111)
Creatinine, Ser: 0.73 mg/dL (ref 0.44–1.00)
GFR calc Af Amer: >60 mL/min (ref >60)
GFR calc non Af Amer: >60 mL/min (ref >60)
Glucose, Bld: 96 mg/dL (ref 70–99)
Potassium: 4 mmol/L (ref 3.5–5.1)
Sodium: 142 mmol/L (ref 135–145)

## 2020-02-13 LAB — CBC
HCT: 42.8 % (ref 36.0–46.0)
Hemoglobin: 13.9 g/dL (ref 12.0–15.0)
MCH: 31.2 pg (ref 26.0–34.0)
MCHC: 32.5 g/dL (ref 30.0–36.0)
MCV: 96 fL (ref 80.0–100.0)
Platelets: 356 10*3/uL (ref 150–400)
RBC: 4.46 MIL/uL (ref 3.87–5.11)
RDW: 12.7 % (ref 11.5–15.5)
WBC: 8.3 10*3/uL (ref 4.0–10.5)
nRBC: 0 % (ref 0.0–0.2)

## 2020-02-13 MED ORDER — CARVEDILOL 3.125 MG PO TABS
3.1250 mg | ORAL_TABLET | Freq: Two times a day (BID) | ORAL | 3 refills | Status: DC
Start: 2020-02-13 — End: 2020-06-11

## 2020-02-13 NOTE — H&P (View-Only) (Signed)
ADVANCED HF CLINIC CONSULT NOTE  Referring Physician: Dr. Gladys Damme [facility])  Primary Care: Dr. Gladys Damme [facility])  Primary Cardiologist: Dr. Johnsie Cancel (not seen since 2016)  HPI:  Angela Serrano is a 59 y/o woman with HTN, GERD with esophageal stricture s/p dilation in 2016 and tobacco use referred by Dr. Chauncey Reading for further evaluation of her HF.  Saw Dr. Johnsie Cancel in 2016 for moderate pericardial effusion found incidentally on chest CT.  TEE 1/16 EF 55-60% and normal valves. Treated with NSAIDs for pleuropericarditis. Echo 4/16 EF 45-50%.  Was feeling fine until this past year when she began to notice she was getting more and more SOB. When she gets upset she feels like someone is sitting on her chest. Also has some mild chest pressure when she walk. Mild LE edema. When to Dr. Chauncey Reading who ordered echo. Echo 4/21 EF 35-40% global HK grade II DD. + mild orthopnea which is new. Smokes 1/2 ppd. Drinks 3 24oz beers per week. Feeling somewhat better on meds  But still says I'm a 4-5/10. Drives a forklift.    Review of Systems: [y] = yes, [ ]  = no   General: Weight gain [ ] ; Weight loss [ ] ; Anorexia [ ] ; Fatigue [ ] ; Fever [ ] ; Chills [ ] ; Weakness [ ]   Cardiac: Chest pain/pressure Blue.Reese ]; Resting SOB [ ] ; Exertional SOB Blue.Reese ]; Orthopnea [ y]; Pedal Edema Blue.Reese ]; Palpitations [ ] ; Syncope [ ] ; Presyncope [ ] ; Paroxysmal nocturnal dyspnea[ ]   Pulmonary: Cough Blue.Reese ]; Wheezing[ ] ; Hemoptysis[ ] ; Sputum [ ] ; Snoring [ ]   GI: Vomiting[ ] ; Dysphagia[ ] ; Melena[ ] ; Hematochezia [ ] ; Heartburn[ ] ; Abdominal pain [ ] ; Constipation [ ] ; Diarrhea [ ] ; BRBPR [ ]   GU: Hematuria[ ] ; Dysuria [ ] ; Nocturia[ ]   Vascular: Pain in legs with walking [ ] ; Pain in feet with lying flat [ ] ; Non-healing sores [ ] ; Stroke [ ] ; TIA [ ] ; Slurred speech [ ] ;  Neuro: Headaches[ ] ; Vertigo[ ] ; Seizures[ ] ; Paresthesias[ ] ;Blurred vision [ ] ; Diplopia [ ] ; Vision changes [ ]   Ortho/Skin: Arthritis [ y]; Joint pain [ y]; Muscle  pain [ ] ; Joint swelling [ ] ; Back Pain [ ] ; Rash [ ]   Psych: Depression[ ] ; Anxiety[ ]   Heme: Bleeding problems [ ] ; Clotting disorders [ ] ; Anemia [ ]   Endocrine: Diabetes [ ] ; Thyroid dysfunction[ ]    Past Medical History:  Diagnosis Date  . Acid reflux disease   . CHF (congestive heart failure), NYHA class I ([place]) 01/17/2015  . Poor dentition   . Seasonal allergies 2011   sneezing and hoarseness    Current Outpatient Medications  Medication Sig Dispense Refill  . metoprolol tartrate (LOPRESSOR) 25 MG tablet TAKE 1/2 TABLET BY MOUTH TWICE A DAY 15 tablet 3  . acetaminophen (TYLENOL) 500 MG tablet Take 500 mg by mouth every 6 (six) hours as needed for mild pain.    Marland Kitchen aspirin-acetaminophen-caffeine (EXCEDRIN MIGRAINE) 250-250-65 MG per tablet Take 1 tablet by mouth every 6 (six) hours as needed for headache.    Marland Kitchen atorvastatin (LIPITOR) 20 MG tablet Take 1 tablet (20 mg total) by mouth daily. Notify provider of alternative if too costly 90 tablet 3  . calcium carbonate (TUMS - DOSED IN MG ELEMENTAL CALCIUM) 500 MG chewable tablet Chew 1 tablet by mouth 2 (two) times daily as needed for indigestion or heartburn.    Marland Kitchen ibuprofen (ADVIL,MOTRIN) 600 MG tablet Take 1 tablet (600 mg  total) by mouth every 6 (six) hours. 30 tablet 0  . indapamide (LOZOL) 2.5 MG tablet Take 1 tablet (2.5 mg total) by mouth daily. 90 tablet 3  . lisinopril (PRINIVIL,ZESTRIL) 5 MG tablet TAKE 1 TABLET BY MOUTH EVERY DAY 30 tablet 1  . pantoprazole (PROTONIX) 40 MG tablet Take 1 tablet (40 mg total) by mouth daily. Needs visit with PCP. 30 tablet 0  . traMADol (ULTRAM) 50 MG tablet Take 1 tablet (50 mg total) by mouth every 6 (six) hours as needed. 20 tablet 0   No current facility-administered medications for this encounter.    No Known Allergies    Social History   Socioeconomic History  . Marital status: Single    Spouse name: Not on file  . Number of children: 3  . Years of education: Not on file  .  Highest education level: Not on file  Occupational History  . 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 Smoking status: Current Every Day Smoker    Packs/day: 0.25    Years: 30.00    Pack years: 7.50    Types: Cigarettes  . Smokeless tobacco: Never Used  . Tobacco comment: down to 3 cigs a day  Substance and Sexual Activity  . Alcohol use: Yes    Alcohol/week: 2.0 standard drinks    Types: 2 Cans of beer per week    Comment: one beer per night for sleep  . Drug use: No  . Sexual activity: Not Currently    Birth control/protection: None  Other Topics Concern  . Not on file  Social History Narrative   Lives with daughters, and 2 grandchildren. Total of 4 grandchildren, likes to dance for exercise. Works at [employer] and [employer] now.   Social Determinants of Health   Financial Resource Strain:   . Difficulty of Paying Living Expenses:   Food Insecurity:   . Worried About Charity fundraiser in the Last Year:   . Arboriculturist in the Last Year:   Transportation Needs:   . Film/video editor (Medical):   Marland Kitchen Lack of Transportation (Non-Medical):   Physical Activity:   . Days of Exercise per Week:   . Minutes of Exercise per Session:   Stress:   . Feeling of Stress :   Social Connections:   . Frequency of Communication with Friends and Family:   . Frequency of Social Gatherings with Friends and Family:   . Attends Religious Services:   . Active Member of Clubs or Organizations:   . Attends Archivist Meetings:   Marland Kitchen Marital Status:   Intimate Partner Violence:   . Fear of Current or Ex-Partner:   . Emotionally Abused:   Marland Kitchen Physically Abused:   . Sexually Abused:       Family History  Problem Relation Age of Onset  . Cancer Mother 77       Bone  . Alcohol abuse Father 30  . Hypertension Sister   . Hyperlipidemia Sister   . Hypertension Sister   . Thyroid disease Daughter   . Diabetes Maternal  Aunt   . Cancer Maternal Aunt 60       Bone   . Diabetes Maternal Grandmother   . Cancer Maternal Aunt 63       Bone     Vitals:   02/13/20 1505  BP: 106/60  Pulse: 86  SpO2: 97%  Weight: 59.9 kg (132 lb)    PHYSICAL EXAM: General:  Well appearing. No respiratory difficulty HEENT: normal Neck: supple. no JVD. Carotids 2+ bilat; no bruits. No lymphadenopathy or thryomegaly appreciated. Cor: PMI nondisplaced. Regular rate & rhythm. No rubs, gallops or murmurs. Lungs: clear Abdomen: soft, nontender, nondistended. No hepatosplenomegaly. No bruits or masses. Good bowel sounds. Extremities: no cyanosis, clubbing, rash, edema Neuro: alert & oriented x 3, cranial nerves grossly intact. moves all 4 extremities w/o difficulty. Affect pleasant.  ECG: NSR 83 Non-specific T wave abnormality Personally reviewed   ASSESSMENT & PLAN:  1. Chronic systolic HF/chest pressure - Echo 4/21 EF 35-40% global HK grade II DD. Unclear etiology - Given CRFs, chest pressure and new drop in EF will need R/L cath to further evaluate. Procedure discussed in detail with her and her daughters - If cath normal will need cMRI - NYHA II - Volume status ok  - switch lopressor to carvedilol 3.125 bid - continue lisinopril. - eventual addition of spiro and SGLT2i as BP tolerates - ETOH cessation advised  2. ETOH use - discussed possibility that this could be cause of reduced EF  3. Tobacco use - encouraged cessation   Glori Bickers, MD  3:02 PM

## 2020-02-13 NOTE — Patient Instructions (Signed)
Stop Metoprolol  Start Carvedilol 3.125 mg Twice daily   Your physician has requested that you have a cardiac catheterization. Cardiac catheterization is used to diagnose and/or treat various heart conditions. Doctors may recommend this procedure for a number of different reasons. The most common reason is to evaluate chest pain. Chest pain can be a symptom of coronary artery disease (CAD), and cardiac catheterization can show whether plaque is narrowing or blocking your heart's arteries. This procedure is also used to evaluate the valves, as well as measure the blood flow and oxygen levels in different parts of your heart. For further information please visit [email or web address]. Please follow instruction sheet, as given. Fri 5/28, see instructions below  Your physician recommends that you schedule a follow-up appointment in: 2 months  If you have any questions or concerns before your next appointment please send [place] a message through [place] or call our office at [phone].  At the [facility], you and your health needs are our priority. As part of our continuing mission to provide you with exceptional heart care, we have created designated Provider Care Teams. These Care Teams include your primary Cardiologist (physician) and Advanced Practice Providers (APPs- Physician Assistants and Nurse Practitioners) who all work together to provide you with the care you need, when you need it.   You may see any of the following providers on your designated Care Team at your next follow up: Marland Kitchen Dr Glori Bickers . Dr Loralie Champagne . Darrick Grinder, NP . Lyda Jester, PA . Audry Riles, PharmD   Please be sure to bring in all your medications bottles to every appointment.     CATHETERIZATION INSTRUCTIONS:  You are scheduled for a Cardiac Catheterization on Friday, May 28 with Dr. Glori Bickers.  1. Please arrive at the [facility] (Main Entrance A) at [facility]: [address] [place], [place] [address] at 11:30 AM (This time is two hours before your procedure to ensure your preparation). Free valet parking service is available.   Special note: Every effort is made to have your procedure done on time. Please understand that emergencies sometimes delay scheduled procedures.  2. Diet: Do not eat solid foods after midnight.  The patient may have clear liquids until 5am upon the day of the procedure.  3. COVID TEST: Thursday 5/27 AT 9:00 am  4. Medication instructions in preparation for your procedure:  On the morning of your procedure, take any morning medicines NOT listed above.  You may use sips of water.  5. Plan for one night stay--bring personal belongings. 6. Bring a current list of your medications and current insurance cards. 7. You MUST have a responsible person to drive you home. 8. Someone MUST be with you the first 24 hours after you arrive home or your discharge will be delayed. 9. Please wear clothes that are easy to get on and off and wear slip-on shoes.  Thank you for allowing [place] to care for you!   -- [place] [facility]

## 2020-02-13 NOTE — Progress Notes (Signed)
ADVANCED HF CLINIC CONSULT NOTE  Referring Physician: Dr. Gladys Damme [facility])  Primary Care: Dr. Gladys Damme [facility])  Primary Cardiologist: Dr. Johnsie Cancel (not seen since 2016)  HPI:  Angela Serrano is a 59 y/o woman with HTN, GERD with esophageal stricture s/p dilation in 2016 and tobacco use referred by Dr. Chauncey Reading for further evaluation of her HF.  Saw Dr. Johnsie Cancel in 2016 for moderate pericardial effusion found incidentally on chest CT.  TEE 1/16 EF 55-60% and normal valves. Treated with NSAIDs for pleuropericarditis. Echo 4/16 EF 45-50%.  Was feeling fine until this past year when she began to notice she was getting more and more SOB. When she gets upset she feels like someone is sitting on her chest. Also has some mild chest pressure when she walk. Mild LE edema. When to Dr. Chauncey Reading who ordered echo. Echo 4/21 EF 35-40% global HK grade II DD. + mild orthopnea which is new. Smokes 1/2 ppd. Drinks 3 24oz beers per week. Feeling somewhat better on meds  But still says I'm a 4-5/10. Drives a forklift.    Review of Systems: [y] = yes, [ ]  = no   General: Weight gain [ ] ; Weight loss [ ] ; Anorexia [ ] ; Fatigue [ ] ; Fever [ ] ; Chills [ ] ; Weakness [ ]   Cardiac: Chest pain/pressure Blue.Reese ]; Resting SOB [ ] ; Exertional SOB Blue.Reese ]; Orthopnea [ y]; Pedal Edema Blue.Reese ]; Palpitations [ ] ; Syncope [ ] ; Presyncope [ ] ; Paroxysmal nocturnal dyspnea[ ]   Pulmonary: Cough Blue.Reese ]; Wheezing[ ] ; Hemoptysis[ ] ; Sputum [ ] ; Snoring [ ]   GI: Vomiting[ ] ; Dysphagia[ ] ; Melena[ ] ; Hematochezia [ ] ; Heartburn[ ] ; Abdominal pain [ ] ; Constipation [ ] ; Diarrhea [ ] ; BRBPR [ ]   GU: Hematuria[ ] ; Dysuria [ ] ; Nocturia[ ]   Vascular: Pain in legs with walking [ ] ; Pain in feet with lying flat [ ] ; Non-healing sores [ ] ; Stroke [ ] ; TIA [ ] ; Slurred speech [ ] ;  Neuro: Headaches[ ] ; Vertigo[ ] ; Seizures[ ] ; Paresthesias[ ] ;Blurred vision [ ] ; Diplopia [ ] ; Vision changes [ ]   Ortho/Skin: Arthritis [ y]; Joint pain [ y]; Muscle  pain [ ] ; Joint swelling [ ] ; Back Pain [ ] ; Rash [ ]   Psych: Depression[ ] ; Anxiety[ ]   Heme: Bleeding problems [ ] ; Clotting disorders [ ] ; Anemia [ ]   Endocrine: Diabetes [ ] ; Thyroid dysfunction[ ]    Past Medical History:  Diagnosis Date  . Acid reflux disease   . CHF (congestive heart failure), NYHA class I ([place]) 01/17/2015  . Poor dentition   . Seasonal allergies 2011   sneezing and hoarseness    Current Outpatient Medications  Medication Sig Dispense Refill  . metoprolol tartrate (LOPRESSOR) 25 MG tablet TAKE 1/2 TABLET BY MOUTH TWICE A DAY 15 tablet 3  . acetaminophen (TYLENOL) 500 MG tablet Take 500 mg by mouth every 6 (six) hours as needed for mild pain.    Marland Kitchen aspirin-acetaminophen-caffeine (EXCEDRIN MIGRAINE) 250-250-65 MG per tablet Take 1 tablet by mouth every 6 (six) hours as needed for headache.    Marland Kitchen atorvastatin (LIPITOR) 20 MG tablet Take 1 tablet (20 mg total) by mouth daily. Notify provider of alternative if too costly 90 tablet 3  . calcium carbonate (TUMS - DOSED IN MG ELEMENTAL CALCIUM) 500 MG chewable tablet Chew 1 tablet by mouth 2 (two) times daily as needed for indigestion or heartburn.    Marland Kitchen ibuprofen (ADVIL,MOTRIN) 600 MG tablet Take 1 tablet (600 mg  total) by mouth every 6 (six) hours. 30 tablet 0  . indapamide (LOZOL) 2.5 MG tablet Take 1 tablet (2.5 mg total) by mouth daily. 90 tablet 3  . lisinopril (PRINIVIL,ZESTRIL) 5 MG tablet TAKE 1 TABLET BY MOUTH EVERY DAY 30 tablet 1  . pantoprazole (PROTONIX) 40 MG tablet Take 1 tablet (40 mg total) by mouth daily. Needs visit with PCP. 30 tablet 0  . traMADol (ULTRAM) 50 MG tablet Take 1 tablet (50 mg total) by mouth every 6 (six) hours as needed. 20 tablet 0   No current facility-administered medications for this encounter.    No Known Allergies    Social History   Socioeconomic History  . Marital status: Single    Spouse name: Not on file  . Number of children: 3  . Years of education: Not on file  .  Highest education level: Not on file  Occupational History  . 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 Smoking status: Current Every Day Smoker    Packs/day: 0.25    Years: 30.00    Pack years: 7.50    Types: Cigarettes  . Smokeless tobacco: Never Used  . Tobacco comment: down to 3 cigs a day  Substance and Sexual Activity  . Alcohol use: Yes    Alcohol/week: 2.0 standard drinks    Types: 2 Cans of beer per week    Comment: one beer per night for sleep  . Drug use: No  . Sexual activity: Not Currently    Birth control/protection: None  Other Topics Concern  . Not on file  Social History Narrative   Lives with daughters, and 2 grandchildren. Total of 4 grandchildren, likes to dance for exercise. Works at [employer] and [employer] now.   Social Determinants of Health   Financial Resource Strain:   . Difficulty of Paying Living Expenses:   Food Insecurity:   . Worried About Charity fundraiser in the Last Year:   . Arboriculturist in the Last Year:   Transportation Needs:   . Film/video editor (Medical):   Marland Kitchen Lack of Transportation (Non-Medical):   Physical Activity:   . Days of Exercise per Week:   . Minutes of Exercise per Session:   Stress:   . Feeling of Stress :   Social Connections:   . Frequency of Communication with Friends and Family:   . Frequency of Social Gatherings with Friends and Family:   . Attends Religious Services:   . Active Member of Clubs or Organizations:   . Attends Archivist Meetings:   Marland Kitchen Marital Status:   Intimate Partner Violence:   . Fear of Current or Ex-Partner:   . Emotionally Abused:   Marland Kitchen Physically Abused:   . Sexually Abused:       Family History  Problem Relation Age of Onset  . Cancer Mother 70       Bone  . Alcohol abuse Father 61  . Hypertension Sister   . Hyperlipidemia Sister   . Hypertension Sister   . Thyroid disease Daughter   . Diabetes Maternal  Aunt   . Cancer Maternal Aunt 60       Bone   . Diabetes Maternal Grandmother   . Cancer Maternal Aunt 63       Bone     Vitals:   02/13/20 1505  BP: 106/60  Pulse: 86  SpO2: 97%  Weight: 59.9 kg (132 lb)    PHYSICAL EXAM: General:  Well appearing. No respiratory difficulty HEENT: normal Neck: supple. no JVD. Carotids 2+ bilat; no bruits. No lymphadenopathy or thryomegaly appreciated. Cor: PMI nondisplaced. Regular rate & rhythm. No rubs, gallops or murmurs. Lungs: clear Abdomen: soft, nontender, nondistended. No hepatosplenomegaly. No bruits or masses. Good bowel sounds. Extremities: no cyanosis, clubbing, rash, edema Neuro: alert & oriented x 3, cranial nerves grossly intact. moves all 4 extremities w/o difficulty. Affect pleasant.  ECG: NSR 83 Non-specific T wave abnormality Personally reviewed   ASSESSMENT & PLAN:  1. Chronic systolic HF/chest pressure - Echo 4/21 EF 35-40% global HK grade II DD. Unclear etiology - Given CRFs, chest pressure and new drop in EF will need R/L cath to further evaluate. Procedure discussed in detail with her and her daughters - If cath normal will need cMRI - NYHA II - Volume status ok  - switch lopressor to carvedilol 3.125 bid - continue lisinopril. - eventual addition of spiro and SGLT2i as BP tolerates - ETOH cessation advised  2. ETOH use - discussed possibility that this could be cause of reduced EF  3. Tobacco use - encouraged cessation   Glori Bickers, MD  3:02 PM

## 2020-02-14 ENCOUNTER — Encounter (HOSPITAL_COMMUNITY): Payer: Self-pay

## 2020-02-14 ENCOUNTER — Other Ambulatory Visit (HOSPITAL_COMMUNITY): Payer: Self-pay | Admitting: *Deleted

## 2020-02-14 DIAGNOSIS — I5022 Chronic systolic (congestive) heart failure: Secondary | ICD-10-CM

## 2020-02-14 MED ORDER — SODIUM CHLORIDE 0.9% FLUSH: 3.0000 mL | Freq: Two times a day (BID) | INTRAVENOUS | Status: DC

## 2020-02-15 ENCOUNTER — Other Ambulatory Visit (HOSPITAL_COMMUNITY)
Admission: RE | Admit: 2020-02-15 | Discharge: 2020-02-15 | Disposition: A | Discharge status: Home / Self Care | Payer: 59 | Source: Ambulatory Visit | Attending: Internal Medicine | Admitting: Internal Medicine

## 2020-02-15 DIAGNOSIS — Z20822 Contact with and (suspected) exposure to covid-19: Secondary | ICD-10-CM | POA: Diagnosis not present

## 2020-02-15 DIAGNOSIS — Z01812 Encounter for preprocedural laboratory examination: Secondary | ICD-10-CM | POA: Diagnosis present

## 2020-02-15 LAB — SARS CORONAVIRUS 2 (TAT 6-24 HRS): SARS Coronavirus 2: NEGATIVE

## 2020-02-16 ENCOUNTER — Ambulatory Visit (HOSPITAL_COMMUNITY)
Admission: RE | Admit: 2020-02-16 | Discharge: 2020-02-16 | Disposition: A | Discharge status: Home / Self Care | Payer: 59 | Attending: Internal Medicine | Admitting: Internal Medicine

## 2020-02-16 ENCOUNTER — Encounter (HOSPITAL_COMMUNITY)
Admission: RE | Disposition: A | Discharge status: Home / Self Care | Payer: Self-pay | Source: Home / Self Care | Attending: Internal Medicine

## 2020-02-16 ENCOUNTER — Other Ambulatory Visit: Payer: Self-pay

## 2020-02-16 DIAGNOSIS — K219 Gastro-esophageal reflux disease without esophagitis: Secondary | ICD-10-CM | POA: Diagnosis not present

## 2020-02-16 DIAGNOSIS — R0789 Other chest pain: Secondary | ICD-10-CM | POA: Insufficient documentation

## 2020-02-16 DIAGNOSIS — I5022 Chronic systolic (congestive) heart failure: Secondary | ICD-10-CM | POA: Insufficient documentation

## 2020-02-16 DIAGNOSIS — I428 Other cardiomyopathies: Secondary | ICD-10-CM | POA: Diagnosis not present

## 2020-02-16 DIAGNOSIS — F17210 Nicotine dependence, cigarettes, uncomplicated: Secondary | ICD-10-CM | POA: Insufficient documentation

## 2020-02-16 DIAGNOSIS — Z79899 Other long term (current) drug therapy: Secondary | ICD-10-CM | POA: Insufficient documentation

## 2020-02-16 DIAGNOSIS — Z7289 Other problems related to lifestyle: Secondary | ICD-10-CM | POA: Insufficient documentation

## 2020-02-16 DIAGNOSIS — I110 Hypertensive heart disease with heart failure: Secondary | ICD-10-CM | POA: Insufficient documentation

## 2020-02-16 HISTORY — PX: RIGHT/LEFT HEART CATH AND CORONARY ANGIOGRAPHY: CATH118266

## 2020-02-16 LAB — POCT I-STAT 7, (LYTES, BLD GAS, ICA,H+H)
Acid-Base Excess: 0 mmol/L (ref 0.0–2.0)
Bicarbonate: 23.8 mmol/L (ref 20.0–28.0)
Calcium, Ion: 1.05 mmol/L — ABNORMAL LOW (ref 1.15–1.40)
HCT: 36 % (ref 36.0–46.0)
Hemoglobin: 12.2 g/dL (ref 12.0–15.0)
O2 Saturation: 98 %
Potassium: 3.3 mmol/L — ABNORMAL LOW (ref 3.5–5.1)
Sodium: 143 mmol/L (ref 135–145)
TCO2: 25 mmol/L (ref 22–32)
pCO2 arterial: 33.9 mmHg (ref 32.0–48.0)
pH, Arterial: 7.455 — ABNORMAL HIGH (ref 7.350–7.450)
pO2, Arterial: 92 mmHg (ref 83.0–108.0)

## 2020-02-16 LAB — POCT I-STAT EG7
Acid-Base Excess: 0 mmol/L (ref 0.0–2.0)
Acid-Base Excess: 2 mmol/L (ref 0.0–2.0)
Bicarbonate: 24.3 mmol/L (ref 20.0–28.0)
Bicarbonate: 27.1 mmol/L (ref 20.0–28.0)
Calcium, Ion: 0.99 mmol/L — ABNORMAL LOW (ref 1.15–1.40)
Calcium, Ion: 1.18 mmol/L (ref 1.15–1.40)
HCT: 35 % — ABNORMAL LOW (ref 36.0–46.0)
HCT: 39 % (ref 36.0–46.0)
Hemoglobin: 11.9 g/dL — ABNORMAL LOW (ref 12.0–15.0)
Hemoglobin: 13.3 g/dL (ref 12.0–15.0)
O2 Saturation: 69 %
O2 Saturation: 69 %
Potassium: 3.1 mmol/L — ABNORMAL LOW (ref 3.5–5.1)
Potassium: 3.5 mmol/L (ref 3.5–5.1)
Sodium: 141 mmol/L (ref 135–145)
Sodium: 145 mmol/L (ref 135–145)
TCO2: 26 mmol/L (ref 22–32)
TCO2: 28 mmol/L (ref 22–32)
pCO2, Ven: 39.8 mmHg — ABNORMAL LOW (ref 44.0–60.0)
pCO2, Ven: 43.8 mmHg — ABNORMAL LOW (ref 44.0–60.0)
pH, Ven: 7.395 (ref 7.250–7.430)
pH, Ven: 7.399 (ref 7.250–7.430)
pO2, Ven: 36 mmHg (ref 32.0–45.0)
pO2, Ven: 36 mmHg (ref 32.0–45.0)

## 2020-02-16 SURGERY — RIGHT/LEFT HEART CATH AND CORONARY ANGIOGRAPHY
Anesthesia: LOCAL

## 2020-02-16 MED ORDER — MIDAZOLAM HCL 2 MG/2ML IJ SOLN
INTRAMUSCULAR | Status: AC
  Filled 2020-02-16: qty 2

## 2020-02-16 MED ORDER — HEPARIN (PORCINE) IN NACL 1000-0.9 UT/500ML-% IV SOLN
INTRAVENOUS | Status: AC
  Filled 2020-02-16: qty 1000

## 2020-02-16 MED ORDER — SODIUM CHLORIDE 0.9 % IV SOLN: INTRAVENOUS | Status: DC

## 2020-02-16 MED ORDER — LIDOCAINE HCL (PF) 1 % IJ SOLN
INTRAMUSCULAR | Status: AC
  Filled 2020-02-16: qty 30

## 2020-02-16 MED ORDER — SODIUM CHLORIDE 0.9 % IV SOLN
INTRAVENOUS | Status: AC | PRN
  Administered 2020-02-16: 10 mL/h via INTRAVENOUS

## 2020-02-16 MED ORDER — ASPIRIN 81 MG PO CHEW
81.0000 mg | CHEWABLE_TABLET | ORAL | Status: AC
  Administered 2020-02-16: 81 mg via ORAL

## 2020-02-16 MED ORDER — VERAPAMIL HCL 2.5 MG/ML IV SOLN
INTRAVENOUS | Status: DC | PRN
  Administered 2020-02-16: 10 mL via INTRA_ARTERIAL

## 2020-02-16 MED ORDER — HEPARIN SODIUM (PORCINE) 1000 UNIT/ML IJ SOLN
INTRAMUSCULAR | Status: AC
  Filled 2020-02-16: qty 1

## 2020-02-16 MED ORDER — VERAPAMIL HCL 2.5 MG/ML IV SOLN
INTRAVENOUS | Status: AC
  Filled 2020-02-16: qty 2

## 2020-02-16 MED ORDER — FENTANYL CITRATE (PF) 100 MCG/2ML IJ SOLN
INTRAMUSCULAR | Status: DC | PRN
  Administered 2020-02-16: 25 ug via INTRAVENOUS

## 2020-02-16 MED ORDER — FENTANYL CITRATE (PF) 100 MCG/2ML IJ SOLN
INTRAMUSCULAR | Status: AC
  Filled 2020-02-16: qty 2

## 2020-02-16 MED ORDER — HEPARIN (PORCINE) IN NACL 1000-0.9 UT/500ML-% IV SOLN
INTRAVENOUS | Status: DC | PRN
  Administered 2020-02-16: 500 mL

## 2020-02-16 MED ORDER — LIDOCAINE HCL (PF) 1 % IJ SOLN
INTRAMUSCULAR | Status: DC | PRN
  Administered 2020-02-16 (×2): 2 mL

## 2020-02-16 MED ORDER — ASPIRIN 81 MG PO CHEW
CHEWABLE_TABLET | ORAL | Status: AC
  Filled 2020-02-16: qty 1

## 2020-02-16 MED ORDER — HEPARIN SODIUM (PORCINE) 1000 UNIT/ML IJ SOLN
INTRAMUSCULAR | Status: DC | PRN
  Administered 2020-02-16: 3000 [IU] via INTRAVENOUS

## 2020-02-16 MED ORDER — SODIUM CHLORIDE 0.9 % IV SOLN: 250.0000 mL | INTRAVENOUS | Status: DC | PRN

## 2020-02-16 MED ORDER — SODIUM CHLORIDE 0.9% FLUSH: 3.0000 mL | INTRAVENOUS | Status: DC | PRN

## 2020-02-16 MED ORDER — IOHEXOL 350 MG/ML SOLN
INTRAVENOUS | Status: DC | PRN
  Administered 2020-02-16: 30 mL

## 2020-02-16 MED ORDER — MIDAZOLAM HCL 2 MG/2ML IJ SOLN
INTRAMUSCULAR | Status: DC | PRN
  Administered 2020-02-16: 1 mg via INTRAVENOUS

## 2020-02-16 SURGICAL SUPPLY — 11 items
CATH 5FR JL3.5 JR4 ANG PIG MP (CATHETERS) ×2 IMPLANT
CATH SWAN GANZ 7F STRAIGHT (CATHETERS) ×2 IMPLANT
DEVICE RAD COMP TR BAND LRG (VASCULAR PRODUCTS) ×2 IMPLANT
GLIDESHEATH SLEND SS 6F .021 (SHEATH) ×2 IMPLANT
GLIDESHEATH SLENDER 7FR .021G (SHEATH) ×2 IMPLANT
GUIDEWIRE INQWIRE 1.5J.035X260 (WIRE) ×1 IMPLANT
INQWIRE 1.5J .035X260CM (WIRE) ×2
KIT MICROPUNCTURE NIT STIFF (SHEATH) ×2 IMPLANT
PACK CARDIAC CATHETERIZATION (CUSTOM PROCEDURE TRAY) ×2 IMPLANT
SHEATH PROBE COVER 6X72 (BAG) ×2 IMPLANT
TRANSDUCER W/STOPCOCK (MISCELLANEOUS) ×2 IMPLANT

## 2020-02-16 NOTE — Interval H&P Note (Signed)
History and Physical Interval Note:  02/16/2020 1:06 PM  Angela Serrano  has presented today for surgery, with the diagnosis of heart failure.  The various methods of treatment have been discussed with the patient and family. After consideration of risks, benefits and other options for treatment, the patient has consented to  Procedure(s): RIGHT/LEFT HEART CATH AND CORONARY ANGIOGRAPHY (N/A) and possible coronary angioplasty as a surgical intervention.  The patient's history has been reviewed, patient examined, no change in status, stable for surgery.  I have reviewed the patient's chart and labs.  Questions were answered to the patient's satisfaction.     Sonda Coppens

## 2020-02-16 NOTE — Progress Notes (Signed)
Discharge instructions reviewed with patient and family. Verbalized understanding. 

## 2020-02-16 NOTE — Discharge Instructions (Signed)
Radial Site Care  This sheet gives you information about how to care for yourself after your procedure. Your health care provider may also give you more specific instructions. If you have problems or questions, contact your health care provider. What can I expect after the procedure? After the procedure, it is common to have:  Bruising and tenderness at the catheter insertion area. Follow these instructions at home: Medicines  Take over-the-counter and prescription medicines only as told by your health care provider. Insertion site care  Follow instructions from your health care provider about how to take care of your insertion site. Make sure you: ? Wash your hands with soap and water before you change your bandage (dressing). If soap and water are not available, use hand sanitizer. ? Change your dressing as told by your health care provider. ? Leave stitches (sutures), skin glue, or adhesive strips in place. These skin closures may need to stay in place for 2 weeks or longer. If adhesive strip edges start to loosen and curl up, you may trim the loose edges. Do not remove adhesive strips completely unless your health care provider tells you to do that.  Check your insertion site every day for signs of infection. Check for: ? Redness, swelling, or pain. ? Fluid or blood. ? Pus or a bad smell. ? Warmth.  Do not take baths, swim, or use a hot tub until your health care provider approves.  You may shower 24-48 hours after the procedure, or as directed by your health care provider. ? Remove the dressing and gently wash the site with plain soap and water. ? Pat the area dry with a clean towel. ? Do not rub the site. That could cause bleeding.  Do not apply powder or lotion to the site. Activity   For 24 hours after the procedure, or as directed by your health care provider: ? Do not flex or bend the affected arm. ? Do not push or pull heavy objects with the affected arm. ? Do not  drive yourself home from the hospital or clinic. You may drive 24 hours after the procedure unless your health care provider tells you not to. ? Do not operate machinery or power tools.  Do not lift anything that is heavier than 10 lb (4.5 kg), or the limit that you are told, until your health care provider says that it is safe.  Ask your health care provider when it is okay to: ? Return to work or school. ? Resume usual physical activities or sports. ? Resume sexual activity. General instructions  If the catheter site starts to bleed, raise your arm and put firm pressure on the site. If the bleeding does not stop, get help right away. This is a medical emergency.  If you went home on the same day as your procedure, a responsible adult should be with you for the first 24 hours after you arrive home.  Keep all follow-up visits as told by your health care provider. This is important. Contact a health care provider if:  You have a fever.  You have redness, swelling, or yellow drainage around your insertion site. Get help right away if:  You have unusual pain at the radial site.  The catheter insertion area swells very fast.  The insertion area is bleeding, and the bleeding does not stop when you hold steady pressure on the area.  Your arm or hand becomes pale, cool, tingly, or numb. These symptoms may represent a serious problem   that is an emergency. Do not wait to see if the symptoms will go away. Get medical help right away. Call your local emergency services (911 in the [place] Do not drive yourself to the hospital. Summary  After the procedure, it is common to have bruising and tenderness at the site.  Follow instructions from your health care provider about how to take care of your radial site wound. Check the wound every day for signs of infection.  Do not lift anything that is heavier than 10 lb (4.5 kg), or the limit that you are told, until your health care provider says  that it is safe. This information is not intended to replace advice given to you by your health care provider. Make sure you discuss any questions you have with your health care provider. Document Revised: 10/13/2017 Document Reviewed: 10/13/2017 Elsevier Patient Education  2020 Elsevier Inc.  

## 2020-02-27 ENCOUNTER — Encounter: Payer: Self-pay | Admitting: Family Medicine

## 2020-02-27 ENCOUNTER — Ambulatory Visit (INDEPENDENT_AMBULATORY_CARE_PROVIDER_SITE_OTHER): Payer: 59 | Admitting: Family Medicine

## 2020-02-27 ENCOUNTER — Other Ambulatory Visit: Payer: Self-pay

## 2020-02-27 VITALS — BP 128/72 | HR 82 | Ht 61.0 in | Wt 132.2 lb

## 2020-02-27 DIAGNOSIS — Z720 Tobacco use: Secondary | ICD-10-CM | POA: Diagnosis not present

## 2020-02-27 DIAGNOSIS — Z0000 Encounter for general adult medical examination without abnormal findings: Secondary | ICD-10-CM | POA: Diagnosis not present

## 2020-02-27 DIAGNOSIS — I5022 Chronic systolic (congestive) heart failure: Secondary | ICD-10-CM | POA: Diagnosis not present

## 2020-02-27 MED ORDER — LOSARTAN POTASSIUM 25 MG PO TABS: 25.0000 mg | ORAL_TABLET | Freq: Every day | ORAL | 0 refills | Status: DC

## 2020-02-27 NOTE — Patient Instructions (Addendum)
Keep up all the good work, continue you smoking cessation. Watch for increasing weight and leg swelling, please call our office if you notice either getting bigger.  Good news, because of your heart cath we can stop the atorvastatin (cholesterol medication). For your heart failure, I recommend you start a medication that will help prolong your life called losartan, take this once daily. You can also stop taking indapamide as the new medication will also treat your blood pressure.  For COVID-19 vaccination you can take tylenol or ibuprofen.  Be Well!  Dr. Chauncey Reading   Heart Failure Action Plan A heart failure action plan helps you understand what to do when you have symptoms of heart failure. Follow the plan that was created by you and your health care provider. Review your plan each time you visit your health care provider. Red zone These signs and symptoms mean you should get medical help right away:  You have trouble breathing when resting.  You have a dry cough that is getting worse.  You have swelling or pain in your legs or abdomen that is getting worse.  You suddenly gain more than 2-3 lb (0.9-1.4 kg) in a day, or more than 5 lb (2.3 kg) in one week. This amount may be more or less depending on your condition.  You have trouble staying awake or you feel confused.  You have chest pain.  You do not have an appetite.  You pass out. If you experience any of these symptoms:  Call your local emergency services (911 in the [place] right away or seek help at the emergency department of the nearest hospital. Yellow zone These signs and symptoms mean your condition may be getting worse and you should make some changes:  You have trouble breathing when you are active or you need to sleep with extra pillows.  You have swelling in your legs or abdomen.  You gain 2-3 lb (0.9-1.4 kg) in one day, or 5 lb (2.3 kg) in one week. This amount may be more or less depending on your  condition.  You get tired easily.  You have trouble sleeping.  You have a dry cough. If you experience any of these symptoms:  Contact your health care provider within the next day.  Your health care provider may adjust your medicines. Green zone These signs mean you are doing well and can continue what you are doing:  You do not have shortness of breath.  You have very little swelling or no new swelling.  Your weight is stable (no gain or loss).  You have a normal activity level.  You do not have chest pain or any other new symptoms. Follow these instructions at home:  Take over-the-counter and prescription medicines only as told by your health care provider.  Weigh yourself daily. Your target weight is ____132 or less______ lb (__________ kg). ? Call your health care provider if you gain more than ______2____ lb (__________ kg) in a day, or more than ____5______ lb (__________ kg) in one week.  Eat a heart-healthy diet. Work with a diet and nutrition specialist (dietitian) to create an eating plan that is best for you.  Keep all follow-up visits as told by your health care provider. This is important. Where to find more information  American Heart Association: www.heart.org Summary  Follow the action plan that was created by you and your health care provider.  Get help right away if you have any symptoms in the Red zone. This information  is not intended to replace advice given to you by your health care provider. Make sure you discuss any questions you have with your health care provider. Document Revised: 08/20/2017 Document Reviewed: 10/17/2016 Elsevier Patient Education  2020 [employer].

## 2020-03-04 ENCOUNTER — Encounter: Payer: Self-pay | Admitting: Family Medicine

## 2020-03-04 NOTE — Assessment & Plan Note (Signed)
Patient doing well. No weight gain or peripheral edema noted. - Continue coreg 3.125 mg BID - Start Losartan 25 mg daily - Discontinue indapamide

## 2020-03-04 NOTE — Assessment & Plan Note (Signed)
Patient continuing toward her goals, still wishes to pursue cutting back and patches over other interventions.

## 2020-03-04 NOTE — Assessment & Plan Note (Signed)
Due this year: pap smear - will reassess option of CT screen for lung cancer at next visit - mammogram completed this year, no concern for malignancy detected.

## 2020-03-04 NOTE — Progress Notes (Signed)
    SUBJECTIVE:   CHIEF COMPLAINT / HPI: f/u for HFrEF  Patient had recent right and left heart cath with Dr. Tempie Hoist, which revealed non-ischemic cardiomyopathy and normal cardiac arteries. Patient says she has felt well, in her usual state of health, no weight gain or peripheral edema. Patient currently on coreg 3.125 mg BID. BP well controlled, but due to mortality benefits of ACEI-ARB, will start losartan and discontinue indapamide.   Patient had ASCVD risk >7.5% prior to heart cath and atorvastatin was started, she reports no problems with this medication.  Patient is continuing cigarette cessation, she continues to wish to pursue cutting back on her own. She does use OTC patch and has cut back to 5 cigarettes per day.  PERTINENT  PMH / PSH: HFrEF, tobacco use  OBJECTIVE:   BP 128/72   Pulse 82   Ht 5\' 1"  (1.549 m)   Wt 60 kg   LMP 04/27/2013 Comment: Perimenapausal   SpO2 98%   BMI 24.98 kg/m   Physical Exam Constitutional:      General: She is not in acute distress.    Appearance: Normal appearance. She is normal weight. She is not ill-appearing, toxic-appearing or diaphoretic.  HENT:     Head: Normocephalic and atraumatic.  Cardiovascular:     Rate and Rhythm: Normal rate and regular rhythm.     Pulses: Normal pulses.     Heart sounds: Normal heart sounds. No murmur heard.  No friction rub. No gallop.   Pulmonary:     Effort: Pulmonary effort is normal.     Breath sounds: Normal breath sounds. No wheezing, rhonchi or rales.  Musculoskeletal:     Right lower leg: No edema.     Left lower leg: No edema.  Skin:    General: Skin is warm and dry.     Capillary Refill: Capillary refill takes less than 2 seconds.  Neurological:     General: No focal deficit present.     Mental Status: She is alert and oriented to person, place, and time. Mental status is at baseline.  Psychiatric:        Mood and Affect: Mood normal.        Behavior: Behavior normal.     ASSESSMENT/PLAN:  Angela Serrano is a 59 yo woman with tobacco use and HFrEF  Congestive heart failure, NYHA class 2 ([place]) Patient doing well. No weight gain or peripheral edema noted. - Continue coreg 3.125 mg BID - Start Losartan 25 mg daily - Discontinue indapamide  Tobacco use Patient continuing toward her goals, still wishes to pursue cutting back and patches over other interventions.  Health care maintenance Due this year: pap smear - will reassess option of CT screen for lung cancer at next visit - mammogram completed this year, no concern for malignancy detected.     Gladys Damme, MD [place]

## 2020-03-22 ENCOUNTER — Other Ambulatory Visit: Payer: Self-pay | Admitting: Family Medicine

## 2020-03-22 DIAGNOSIS — I5022 Chronic systolic (congestive) heart failure: Secondary | ICD-10-CM

## 2020-04-14 ENCOUNTER — Encounter (HOSPITAL_COMMUNITY): Payer: Self-pay

## 2020-04-14 ENCOUNTER — Ambulatory Visit (HOSPITAL_COMMUNITY)
Admission: EM | Admit: 2020-04-14 | Discharge: 2020-04-14 | Disposition: A | Discharge status: Home / Self Care | Payer: 59 | Attending: Family Medicine | Admitting: Family Medicine

## 2020-04-14 ENCOUNTER — Other Ambulatory Visit: Payer: Self-pay

## 2020-04-14 ENCOUNTER — Telehealth: Payer: Self-pay | Admitting: Family Medicine

## 2020-04-14 DIAGNOSIS — R519 Headache, unspecified: Secondary | ICD-10-CM | POA: Diagnosis not present

## 2020-04-14 DIAGNOSIS — I10 Essential (primary) hypertension: Secondary | ICD-10-CM

## 2020-04-14 MED ORDER — CLONIDINE HCL 0.2 MG PO TABS
0.2000 mg | ORAL_TABLET | Freq: Two times a day (BID) | ORAL | 0 refills | Status: DC | PRN
Start: 2020-04-14 — End: 2020-10-30

## 2020-04-14 NOTE — Discharge Instructions (Addendum)
I have sent in clonidine 0.1mg  for you to take one tablet twice a day as needed for elevated blood pressure over 150/90  Follow up with primary care and cardiology this week as scheduled  Follow up in the ER for the worst headache of your life, loss of sensation, blurred vision, trouble swallowing, trouble breathing, other concerning symptoms

## 2020-04-14 NOTE — ED Provider Notes (Signed)
[name]    CSN: [id number] Arrival date & time: 04/14/20  1358      History   Chief Complaint Chief Complaint  Patient presents with  . Hypertension  . Headache    HPI Angela Serrano is a 59 y.o. female.   Reports that she has been experiencing increased blood pressure with headaches. Reports that she is taking her BP meds as prescribed. Has hx CHF, HTN, GERD, tobacco use. Denies changes in vision, dizziness, lightheadedness, fatigue, cough, sore throat, nausea, vomiting, diarrhea, rash, fever, other symptoms.  ROS per HPI  The history is provided by the patient.    Past Medical History:  Diagnosis Date  . Acid reflux disease   . Benign essential HTN   . CHF (congestive heart failure), NYHA class I ([place]) 01/17/2015  . Poor dentition   . Seasonal allergies 2011   sneezing and hoarseness    Patient Active Problem List   Diagnosis Date Noted  . Hyperlipidemia 02/08/2020  . Congestive heart failure, NYHA class 2 ([place]) 01/17/2015  . Elevated blood pressure 01/11/2015  . Colon cancer screening   . CRP elevated   . Weight loss   . Sepsis ([place]) 10/07/2014  . Anemia 10/04/2014  . Fatigue 10/04/2014  . Dysphagia   . Fever   . Abnormal esophagram   . Chest pain on breathing   . Globus sensation   . Hematuria   . Chest pain 09/22/2014  . Left flank pain   . Benign essential HTN 01/11/2013  . Neck pain, bilateral 01/11/2013  . Poor sleep pattern 01/11/2013  . GERD (gastroesophageal reflux disease) 01/11/2013  . Seasonal allergies 01/11/2013  . Tobacco use 04/06/2012  . Health care maintenance 04/06/2012    Past Surgical History:  Procedure Laterality Date  . COLONOSCOPY N/A 10/10/2014   Procedure: COLONOSCOPY;  Surgeon: Irene Shipper, MD;  Location: [facility] ENDOSCOPY;  Service: Endoscopy;  Laterality: N/A;  . ESOPHAGOGASTRODUODENOSCOPY N/A 09/25/2014   Procedure: ESOPHAGOGASTRODUODENOSCOPY (EGD);  Surgeon: Ladene Artist, MD;  Location[facility] ENDOSCOPY;   Service: Endoscopy;  Laterality: N/A;  . MYOMECTOMY  2004  . RIGHT/LEFT HEART CATH AND CORONARY ANGIOGRAPHY N/A 02/16/2020   Procedure: RIGHT/LEFT HEART CATH AND CORONARY ANGIOGRAPHY;  Surgeon: Jolaine Artist, MD;  Location: [facility] CV LAB;  Service: Cardiovascular;  Laterality: N/A;  . SAVORY DILATION N/A 09/25/2014   Procedure: SAVORY DILATION;  Surgeon: Ladene Artist, MD;  Location: [facility] ENDOSCOPY;  Service: Endoscopy;  Laterality: N/A;  . TEE WITHOUT CARDIOVERSION N/A 10/10/2014   Procedure: TRANSESOPHAGEAL ECHOCARDIOGRAM (TEE);  Surgeon: Irene Shipper, MD;  Location: [facility] ENDOSCOPY;  Service: Endoscopy;  Laterality: N/A;  . TUBAL LIGATION  1990  . TUBAL LIGATION Bilateral 1990    OB History    Gravida  3   Para  2   Term  2   Preterm      AB  1   Living        SAB      TAB  1   Ectopic      Multiple      Live Births               Home Medications    Prior to Admission medications   Medication Sig Start Date End Date Taking? Authorizing Provider  carvedilol (COREG) 3.125 MG tablet Take 1 tablet (3.125 mg total) by mouth 2 (two) times daily. 02/13/20   Bensimhon, Shaune Pascal, MD  cloNIDine (CATAPRES) 0.2 MG  tablet Take 1 tablet (0.2 mg total) by mouth 2 (two) times daily as needed. 04/14/20   Faustino Congress, NP  losartan (COZAAR) 25 MG tablet TAKE 1 TABLET BY MOUTH EVERY DAY 03/22/20   Gladys Damme, MD    Family History Family History  Problem Relation Age of Onset  . Cancer Mother 25       Bone  . Alcohol abuse Father 67  . Hypertension Sister   . Hyperlipidemia Sister   . Hypertension Sister   . Thyroid disease Daughter   . Diabetes Maternal Aunt   . Cancer Maternal Aunt 60       Bone   . Diabetes Maternal Grandmother   . Cancer Maternal Aunt 63       Bone     Social History Social History   Tobacco Use  . Smoking status: Current Every Day Smoker    Packs/day: 0.25    Years: 30.00    Pack years: 7.50    Types: Cigarettes  .  Smokeless tobacco: Never Used  . Tobacco comment: down to 3 cigs a day  Vaping Use  . Vaping Use: Never used  Substance Use Topics  . Alcohol use: Yes    Alcohol/week: 2.0 standard drinks    Types: 2 Cans of beer per week    Comment: one beer per night for sleep  . Drug use: No     Allergies   Patient has no known allergies.   Review of Systems Review of Systems   Physical Exam Triage Vital Signs ED Triage Vitals  Enc Vitals Group     BP 04/14/20 1454 (!) 162/79     Pulse Rate 04/14/20 1454 78     Resp 04/14/20 1454 16     Temp 04/14/20 1454 98 F (36.7 C)     Temp src --      SpO2 04/14/20 1454 100 %     Weight --      Height --      Head Circumference --      Peak Flow --      Pain Score 04/14/20 1455 6     Pain Loc --      Pain Edu? --      Excl. in [place]? --    No data found.  Updated Vital Signs BP (!) 160/80 (BP Location: Right Arm)   Pulse 78   Temp 98 F (36.7 C)   Resp 16   LMP 04/27/2013 Comment: Perimenapausal   SpO2 100%   Visual Acuity Right Eye Distance:   Left Eye Distance:   Bilateral Distance:    Right Eye Near:   Left Eye Near:    Bilateral Near:     Physical Exam Vitals and nursing note reviewed.  Constitutional:      General: She is not in acute distress.    Appearance: She is well-developed. She is not ill-appearing.  HENT:     Head: Normocephalic and atraumatic.  Eyes:     Conjunctiva/sclera: Conjunctivae normal.  Cardiovascular:     Rate and Rhythm: Normal rate and regular rhythm.     Heart sounds: Normal heart sounds. No murmur heard.   Pulmonary:     Effort: Pulmonary effort is normal. No respiratory distress.     Breath sounds: Normal breath sounds. No stridor. No wheezing, rhonchi or rales.  Chest:     Chest wall: No tenderness.  Abdominal:     Palpations: Abdomen is soft.  Tenderness: There is no abdominal tenderness.  Musculoskeletal:        General: Normal range of motion.     Cervical back: Normal range  of motion and neck supple.  Skin:    General: Skin is warm and dry.     Capillary Refill: Capillary refill takes less than 2 seconds.  Neurological:     Mental Status: She is alert.  Psychiatric:        Mood and Affect: Mood normal.        Behavior: Behavior normal.      UC Treatments / Results  Labs (all labs ordered are listed, but only abnormal results are displayed) Labs Reviewed - No data to display  EKG   Radiology No results found.  Procedures Procedures (including critical care time)  Medications Ordered in UC Medications - No data to display  Initial Impression / Assessment and Plan / UC Course  I have reviewed the triage vital signs and the nursing notes.  Pertinent labs & imaging results that were available during my care of the patient were reviewed by me and considered in my medical decision making (see chart for details).     Hypertension Headache  Presents today with elevated BP reading and concern of where it should be She is followed by primary care and cardiology Currently taking Carvedilol 3.125mg  and Losartan 25mg  Will add Clonidine 0.1mg  to regimen for increased BP readings Follow up with PCP and cardiology as scheduled Follow up in the ER for the worse headache of your life, changes in vision, change in sensations, weakness, other concerning symptoms Final Clinical Impressions(s) / UC Diagnoses   Final diagnoses:  Elevated blood pressure reading in office with diagnosis of hypertension  Nonintractable headache, unspecified chronicity pattern, unspecified headache type     Discharge Instructions     I have sent in clonidine 0.1mg  for you to take one tablet twice a day as needed for elevated blood pressure over 150/90  Follow up with primary care and cardiology this week as scheduled  Follow up in the ER for the worst headache of your life, loss of sensation, blurred vision, trouble swallowing, trouble breathing, other concerning  symptoms    ED Prescriptions    Medication Sig Dispense Auth. Provider   cloNIDine (CATAPRES) 0.2 MG tablet Take 1 tablet (0.2 mg total) by mouth 2 (two) times daily as needed. 30 tablet Faustino Congress, NP     PDMP not reviewed this encounter.   Faustino Congress, NP 04/18/20 [phone]

## 2020-04-14 NOTE — Telephone Encounter (Signed)
**  After hours emergency line call**  Ms. Plaisted is a 59 year old female with a history of non-ischemic cardiomyopathy with EF 40-45%, hypertension, and tobacco use calling into the after-hours line to discuss "feeling woozy".   She reports she woke up yesterday morning "feeling woozy" that has persisted through today, however just a little bit better.  She checked her blood pressure at [facility] yesterday and today (also had an RN friend check it), systolic 992 yesterday and 190 today.  Reports normally her blood pressure ranges 120-140s, consistent with vitals reviewed through office visits.  Takes losartan and Coreg, endorses compliance.  In addition to feeling lightheaded/dizzy, she also reports some vague left-sided chest discomfort.  Not particularly bothering her, but not usually present.  Urinating is normal.  No headache, vision changes, fever, or shortness of breath.  Reports drinking several beers daily, last drink Friday night.  Given associated symptoms, recommended urgent care/ED evaluation today.  Schedule follow-up appointment in our clinic for Friday, 7/30.  Follows up with Dr. Haroldine Laws on Thursday, 7/29. Will route to PCP.   Patriciaann Clan, DO

## 2020-04-14 NOTE — ED Triage Notes (Signed)
Patient is here today with complaints of elevated blood pressure and headache that started yesterday. Patient states the headache is on/off. Patient states she has a follow up appointment with her PCP on Friday and Cardiologist on Thursday. Patient states she has been taking all prescribed medication as directed.

## 2020-04-18 ENCOUNTER — Ambulatory Visit (HOSPITAL_COMMUNITY)
Admission: RE | Admit: 2020-04-18 | Discharge: 2020-04-18 | Disposition: A | Discharge status: Home / Self Care | Payer: 59 | Source: Ambulatory Visit | Attending: Internal Medicine | Admitting: Internal Medicine

## 2020-04-18 ENCOUNTER — Other Ambulatory Visit: Payer: Self-pay

## 2020-04-18 VITALS — BP 160/84 | HR 72 | Wt 131.0 lb

## 2020-04-18 DIAGNOSIS — Z8249 Family history of ischemic heart disease and other diseases of the circulatory system: Secondary | ICD-10-CM | POA: Diagnosis not present

## 2020-04-18 DIAGNOSIS — I5020 Unspecified systolic (congestive) heart failure: Secondary | ICD-10-CM

## 2020-04-18 DIAGNOSIS — Z7289 Other problems related to lifestyle: Secondary | ICD-10-CM | POA: Diagnosis not present

## 2020-04-18 DIAGNOSIS — Z720 Tobacco use: Secondary | ICD-10-CM

## 2020-04-18 DIAGNOSIS — K222 Esophageal obstruction: Secondary | ICD-10-CM | POA: Diagnosis not present

## 2020-04-18 DIAGNOSIS — Z79899 Other long term (current) drug therapy: Secondary | ICD-10-CM | POA: Diagnosis not present

## 2020-04-18 DIAGNOSIS — K219 Gastro-esophageal reflux disease without esophagitis: Secondary | ICD-10-CM | POA: Diagnosis not present

## 2020-04-18 DIAGNOSIS — F17210 Nicotine dependence, cigarettes, uncomplicated: Secondary | ICD-10-CM | POA: Diagnosis not present

## 2020-04-18 DIAGNOSIS — F1010 Alcohol abuse, uncomplicated: Secondary | ICD-10-CM

## 2020-04-18 DIAGNOSIS — I5022 Chronic systolic (congestive) heart failure: Secondary | ICD-10-CM | POA: Insufficient documentation

## 2020-04-18 DIAGNOSIS — I10 Essential (primary) hypertension: Secondary | ICD-10-CM

## 2020-04-18 DIAGNOSIS — I110 Hypertensive heart disease with heart failure: Secondary | ICD-10-CM | POA: Diagnosis present

## 2020-04-18 LAB — BASIC METABOLIC PANEL
Anion gap: 8 (ref 5–15)
BUN: 14 mg/dL (ref 6–20)
CO2: 26 mmol/L (ref 22–32)
Calcium: 9.3 mg/dL (ref 8.9–10.3)
Chloride: 107 mmol/L (ref 98–111)
Creatinine, Ser: 0.66 mg/dL (ref 0.44–1.00)
GFR calc Af Amer: >60 mL/min (ref >60)
GFR calc non Af Amer: >60 mL/min (ref >60)
Glucose, Bld: 86 mg/dL (ref 70–99)
Potassium: 3.9 mmol/L (ref 3.5–5.1)
Sodium: 141 mmol/L (ref 135–145)

## 2020-04-18 LAB — BRAIN NATRIURETIC PEPTIDE: B Natriuretic Peptide: 209.2 pg/mL — ABNORMAL HIGH (ref 0.0–100.0)

## 2020-04-18 MED ORDER — SACUBITRIL-VALSARTAN 49-51 MG PO TABS: 1.0000 | ORAL_TABLET | Freq: Two times a day (BID) | ORAL | Status: DC

## 2020-04-18 NOTE — Progress Notes (Signed)
ADVANCED HF CLINIC  NOTE  Referring Physician: Dr. Gladys Damme [facility])  Primary Care: Dr. Gladys Damme [facility])  Primary Cardiologist: Dr. Johnsie Cancel (not seen since 2016)  HPI:  Angela Serrano is a 59 y/o woman with HTN, GERD with esophageal stricture s/p dilation in 2016 and tobacco use referred by Dr. Chauncey Reading for further evaluation of her HF.  Saw Dr. Johnsie Cancel in 2016 for moderate pericardial effusion found incidentally on chest CT.  TEE 1/16 EF 55-60% and normal valves. Treated with NSAIDs for pleuropericarditis. Echo  Echo 4/21 EF 35-40% global HK grade II DD.   We saw her for the first time in May. Was not feeling well. NYHA III. Underwent R/L cath which looked good. EF 40-45% no CAD.  Normal RHC. Checks BP every day at home SBP typically around 150. Feels breathing is fine but kids says she is SOB. Can walk without problem. Smoking 1/2 ppd. Still drinking 4 24-oz beers per day No edema, orthopnea or PND.    R/L cath  02/16/20 Ao = 143/76 (103) LV = 132/4 RA = 2 RV = 26/4 PA = 26/8 (17) PCW = 6 Fick cardiac output/index = 3.2/2.4 PVR = 2.9 WU Ao sat = 98% PA sat = 69%, 69%  Assessment: 1. Normal coronary arteries 2. Mild to moderate NICM EF 40-45% 3. Normal hemodynamics    Past Medical History:  Diagnosis Date   Acid reflux disease    Benign essential HTN    CHF (congestive heart failure), NYHA class I ([place]) 01/17/2015   Poor dentition    Seasonal allergies 2011   sneezing and hoarseness    Current Outpatient Medications  Medication Sig Dispense Refill   carvedilol (COREG) 3.125 MG tablet Take 1 tablet (3.125 mg total) by mouth 2 (two) times daily. 60 tablet 3   cloNIDine (CATAPRES) 0.2 MG tablet Take 1 tablet (0.2 mg total) by mouth 2 (two) times daily as needed. 30 tablet 0   losartan (COZAAR) 25 MG tablet TAKE 1 TABLET BY MOUTH EVERY DAY 90 tablet 3   atorvastatin (LIPITOR) 20 MG tablet Take 20 mg by mouth daily.     metoprolol tartrate  (LOPRESSOR) 25 MG tablet Take 25 mg by mouth daily.     No current facility-administered medications for this encounter.    No Known Allergies    Social History   Socioeconomic History   Marital status: Single    Spouse name: Not on file   Number of children: 3   Years of education: Not on file   Highest education level: Not on file  Occupational History   Occupation: Guest Research scientist (physical sciences): [employer]   Tobacco Use   Smoking status: Current Every Day Smoker    Packs/day: 0.25    Years: 30.00    Pack years: 7.50    Types: Cigarettes   Smokeless tobacco: Never Used   Tobacco comment: down to 3 cigs a day  Vaping Use   Vaping Use: Never used  Substance and Sexual Activity   Alcohol use: Yes    Alcohol/week: 2.0 standard drinks    Types: 2 Cans of beer per week    Comment: one beer per night for sleep   Drug use: No   Sexual activity: Not Currently    Birth control/protection: None  Other Topics Concern   Not on file  Social History Narrative   Lives with daughters, and 2 grandchildren. Total  of 4 grandchildren, likes to dance for exercise. Works at [employer] and [employer] now.   Social Determinants of Health   Financial Resource Strain:    Difficulty of Paying Living Expenses:   Food Insecurity:    Worried About Charity fundraiser in the Last Year:    Arboriculturist in the Last Year:   Transportation Needs:    Film/video editor (Medical):    Lack of Transportation (Non-Medical):   Physical Activity:    Days of Exercise per Week:    Minutes of Exercise per Session:   Stress:    Feeling of Stress :   Social Connections:    Frequency of Communication with Friends and Family:    Frequency of Social Gatherings with Friends and Family:    Attends Religious Services:    Active Member of Clubs or Organizations:    Attends Music therapist:    Marital Status:   Intimate Partner Violence:     Fear of Current or Ex-Partner:    Emotionally Abused:    Physically Abused:    Sexually Abused:       Family History  Problem Relation Age of Onset   Cancer Mother 82       Bone   Alcohol abuse Father 25   Hypertension Sister    Hyperlipidemia Sister    Hypertension Sister    Thyroid disease Daughter    Diabetes Maternal Aunt    Cancer Maternal Aunt 60       Bone    Diabetes Maternal Grandmother    Cancer Maternal Aunt 66       Bone     Vitals:   04/18/20 1539  BP: (!) 160/84  Pulse: 72  SpO2: 97%  Weight: 59.4 kg (131 lb)    PHYSICAL EXAM: General:  Well appearing. No resp difficulty HEENT: normal Neck: supple. no JVD. Carotids 2+ bilat; no bruits. No lymphadenopathy or thryomegaly appreciated. Cor: PMI nondisplaced. Regular rate & rhythm. No rubs, gallops or murmurs. Lungs: clear Abdomen: soft, nontender, nondistended. No hepatosplenomegaly. No bruits or masses. Good bowel sounds. Extremities: no cyanosis, clubbing, rash, edema Neuro: alert & orientedx3, cranial nerves grossly intact. moves all 4 extremities w/o difficulty. Affect pleasant    ASSESSMENT & PLAN:  1. Chronic systolic HF/chest pressure - Echo 4/21 EF 35-40% global HK grade II DD. Unclear etiology suspect HTN +/- ETOH - R/L Cath 5/21 Normal cors, EF 40-45% RHC ok - Echo today (04/18/20) EF 50-55% RV ok Personally reviewed - Improved NYHA II - Volume status ok  - Continue carvedilol 3.125 bid - Switch losartan to Entresto 49/51 bid - Eventual addition of spiro and SGLT2i as BP tolerates - Need for BP control and ETOH cessation discussed at length - Check cMRI - Refer PharmD Clinic for aggressive med titration  2. Severe HTN - suspect cause of CM - plan as above  3. ETOH use - discussed possibility that this could be cause of reduced EF - advised need for cessation  4. Tobacco use - encouraged cessation    Glori Bickers, MD  4:10 PM

## 2020-04-18 NOTE — Patient Instructions (Addendum)
STOP Losartan  START Entresto 49/51 (1 tab) Twice daily   Your physician has requested that you regularly monitor and record your blood pressure readings at home. Please use the same machine at the same time of day to check your readings and record them to bring to your follow-up visit.    Labs done today, your results will be available in MyChart, we will contact you for abnormal readings.   Your physician has requested that you have a cardiac MRI. Cardiac MRI uses a computer to create images of your heart as its beating, producing both still and moving pictures of your heart and major blood vessels. For further information please visit [email or web address] They will check with your insurance and then contact you to setup an appointment.     If you have any questions or concerns before your next appointment please send [place] a message through [place] or call our office at [phone].    TO LEAVE A MESSAGE FOR THE NURSE SELECT OPTION 2, PLEASE LEAVE A MESSAGE INCLUDING: . YOUR NAME . DATE OF BIRTH . CALL BACK NUMBER . REASON FOR CALL**this is important as we prioritize the call backs  [place] AS LONG AS YOU CALL BEFORE 4:00 PM   At the [facility], you and your health needs are our priority. As part of our continuing mission to provide you with exceptional heart care, we have created designated Provider Care Teams. These Care Teams include your primary Cardiologist (physician) and Advanced Practice Providers (APPs- Physician Assistants and Nurse Practitioners) who all work together to provide you with the care you need, when you need it.   You may see any of the following providers on your designated Care Team at your next follow up: Marland Kitchen Dr Glori Bickers . Dr Loralie Champagne . Darrick Grinder, NP . Lyda Jester, PA . Audry Riles, PharmD   Please be sure to bring in all your medications bottles to every appointment.

## 2020-04-19 ENCOUNTER — Ambulatory Visit: Payer: 59 | Admitting: Family Medicine

## 2020-05-20 ENCOUNTER — Telehealth (HOSPITAL_COMMUNITY): Payer: Self-pay | Admitting: Pharmacy Technician

## 2020-05-20 ENCOUNTER — Ambulatory Visit (HOSPITAL_COMMUNITY)
Admission: RE | Admit: 2020-05-20 | Discharge: 2020-05-20 | Disposition: A | Discharge status: Home / Self Care | Payer: 59 | Source: Ambulatory Visit | Attending: Cardiology | Admitting: Cardiology

## 2020-05-20 ENCOUNTER — Encounter (HOSPITAL_COMMUNITY): Payer: Self-pay

## 2020-05-20 ENCOUNTER — Other Ambulatory Visit: Payer: Self-pay

## 2020-05-20 VITALS — BP 162/90 | Wt 131.8 lb

## 2020-05-20 DIAGNOSIS — I110 Hypertensive heart disease with heart failure: Secondary | ICD-10-CM | POA: Diagnosis not present

## 2020-05-20 DIAGNOSIS — F17210 Nicotine dependence, cigarettes, uncomplicated: Secondary | ICD-10-CM | POA: Diagnosis not present

## 2020-05-20 DIAGNOSIS — Z7901 Long term (current) use of anticoagulants: Secondary | ICD-10-CM | POA: Diagnosis not present

## 2020-05-20 DIAGNOSIS — I428 Other cardiomyopathies: Secondary | ICD-10-CM | POA: Diagnosis not present

## 2020-05-20 DIAGNOSIS — R0789 Other chest pain: Secondary | ICD-10-CM | POA: Insufficient documentation

## 2020-05-20 DIAGNOSIS — I5022 Chronic systolic (congestive) heart failure: Secondary | ICD-10-CM | POA: Diagnosis not present

## 2020-05-20 LAB — BASIC METABOLIC PANEL
Anion gap: 10 (ref 5–15)
BUN: 19 mg/dL (ref 6–20)
CO2: 24 mmol/L (ref 22–32)
Calcium: 9.1 mg/dL (ref 8.9–10.3)
Chloride: 106 mmol/L (ref 98–111)
Creatinine, Ser: 0.68 mg/dL (ref 0.44–1.00)
GFR calc Af Amer: >60 mL/min (ref >60)
GFR calc non Af Amer: >60 mL/min (ref >60)
Glucose, Bld: 100 mg/dL — ABNORMAL HIGH (ref 70–99)
Potassium: 4.4 mmol/L (ref 3.5–5.1)
Sodium: 140 mmol/L (ref 135–145)

## 2020-05-20 MED ORDER — SPIRONOLACTONE 25 MG PO TABS
12.5000 mg | ORAL_TABLET | Freq: Every day | ORAL | 3 refills | Status: DC
Start: 2020-05-20 — End: 2020-07-30

## 2020-05-20 MED ORDER — ENTRESTO 24-26 MG PO TABS
1.0000 | ORAL_TABLET | Freq: Two times a day (BID) | ORAL | 6 refills | Status: DC
Start: 2020-05-20 — End: 2020-06-13

## 2020-05-20 NOTE — Progress Notes (Signed)
ADVANCED HF CLINIC  NOTE  Referring Physician: Dr. Gladys Damme [facility])  Primary Care: Dr. Gladys Damme [facility])  Primary Cardiologist: Dr. Johnsie Cancel (not seen since 2016) AHFC: Dr. Haroldine Laws   Reason for Visit: f/u for chronic systolic heart failure   HPI:  Angela Serrano is a 59 y/o woman with HTN, GERD with esophageal stricture s/p dilation in 2016 and tobacco use referred by Dr. Chauncey Reading for further evaluation of her HF.  Saw Dr. Johnsie Cancel in 2016 for moderate pericardial effusion found incidentally on chest CT.  TEE 1/16 EF 55-60% and normal valves. Treated with NSAIDs for pleuropericarditis. Echo  Echo 4/21 EF 35-40% global HK grade II DD.   We saw her for the first time in May. Was not feeling well. NYHA III. Underwent R/L cath which looked good. EF 40-45% no CAD.  Normal RHC. She was ordered to get a cMRI but this has not been done yet.   She presents to clinic today w/ her daughter. Doing better from a symptom volume standpoint. Denies exertional dyspnea. No orthopnea/ PND. No LEE. Checks wt every morning. Wt stable at home, 129 lb. Reports full med compliance. Currently on Losartan and Coreg. Will need financial assistance for Entresto (co-pay too high). Her BP remains elevated, 162/90. SBP in the 150s at home. Still smoking <1/2 ppd and drinking 3-4 40 oz beers a day.   R/L cath  02/16/20 Ao = 143/76 (103) LV = 132/4 RA = 2 RV = 26/4 PA = 26/8 (17) PCW = 6 Fick cardiac output/index = 3.2/2.4 PVR = 2.9 WU Ao sat = 98% PA sat = 69%, 69%  Assessment: 1. Normal coronary arteries 2. Mild to moderate NICM EF 40-45% 3. Normal hemodynamics    Past Medical History:  Diagnosis Date  . Acid reflux disease   . Benign essential HTN   . CHF (congestive heart failure), NYHA class I ([place]) 01/17/2015  . Poor dentition   . Seasonal allergies 2011   sneezing and hoarseness    Current Outpatient Medications  Medication Sig Dispense Refill  . carvedilol (COREG) 3.125 MG  tablet Take 1 tablet (3.125 mg total) by mouth 2 (two) times daily. 60 tablet 3  . cloNIDine (CATAPRES) 0.2 MG tablet Take 1 tablet (0.2 mg total) by mouth 2 (two) times daily as needed. 30 tablet 0  . losartan (COZAAR) 25 MG tablet Take 25 mg by mouth daily.     No current facility-administered medications for this encounter.    No Known Allergies    Social History   Socioeconomic History  . Marital status: Single    Spouse name: Not on file  . Number of children: 3  . Years of education: Not on file  . Highest education level: Not on file  Occupational History  . 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 Smoking status: Current Every Day Smoker    Packs/day: 0.25    Years: 30.00    Pack years: 7.50    Types: Cigarettes  . Smokeless tobacco: Never Used  . Tobacco comment: down to 3 cigs a day  Vaping Use  . Vaping Use: Never used  Substance and Sexual Activity  . Alcohol use: Yes    Alcohol/week: 2.0 standard drinks    Types: 2 Cans of beer per week    Comment: one beer per night for sleep  . Drug use: No  .  Sexual activity: Not Currently    Birth control/protection: None  Other Topics Concern  . Not on file  Social History Narrative   Lives with daughters, and 2 grandchildren. Total of 4 grandchildren, likes to dance for exercise. Works at [employer] and [employer] now.   Social Determinants of Health   Financial Resource Strain:   . Difficulty of Paying Living Expenses: Not on file  Food Insecurity:   . Worried About Charity fundraiser in the Last Year: Not on file  . Ran Out of Food in the Last Year: Not on file  Transportation Needs:   . Lack of Transportation (Medical): Not on file  . Lack of Transportation (Non-Medical): Not on file  Physical Activity:   . Days of Exercise per Week: Not on file  . Minutes of Exercise per Session: Not on file  Stress:   . Feeling of Stress : Not on file  Social  Connections:   . Frequency of Communication with Friends and Family: Not on file  . Frequency of Social Gatherings with Friends and Family: Not on file  . Attends Religious Services: Not on file  . Active Member of Clubs or Organizations: Not on file  . Attends Archivist Meetings: Not on file  . Marital Status: Not on file  Intimate Partner Violence:   . Fear of Current or Ex-Partner: Not on file  . Emotionally Abused: Not on file  . Physically Abused: Not on file  . Sexually Abused: Not on file      Family History  Problem Relation Age of Onset  . Cancer Mother 34       Bone  . Alcohol abuse Father 71  . Hypertension Sister   . Hyperlipidemia Sister   . Hypertension Sister   . Thyroid disease Daughter   . Diabetes Maternal Aunt   . Cancer Maternal Aunt 60       Bone   . Diabetes Maternal Grandmother   . Cancer Maternal Aunt 63       Bone     Vitals:   05/20/20 1512  BP: (!) 162/90  Weight: 59.8 kg    PHYSICAL EXAM: General:  Well appearing. No respiratory difficulty HEENT: normal Neck: supple. no JVD. Carotids 2+ bilat; no bruits. No lymphadenopathy or thyromegaly appreciated. Cor: PMI nondisplaced. Regular rate & rhythm. No rubs, gallops or murmurs. Lungs: clear Abdomen: soft, nontender, nondistended. No hepatosplenomegaly. No bruits or masses. Good bowel sounds. Extremities: no cyanosis, clubbing, rash, edema Neuro: alert & oriented x 3, cranial nerves grossly intact. moves all 4 extremities w/o difficulty. Affect pleasant.    ASSESSMENT & PLAN:  1. Chronic systolic HF/chest pressure - Echo 4/21 EF 35-40% global HK grade II DD. Unclear etiology suspect HTN +/- ETOH - R/L Cath 5/21 Normal cors, EF 40-45% RHC ok - NYHA II  - Volume status ok, BP elevated - Stop Losartan. Switch to Entresto 24-26 mg bid (given samples + free 30 day card, pharmD helping w/ cost assistance) - Start Spiro 12.5 mg daily   - Continue carvedilol 3.125 bid  - Eventual  addition of SGLT2i next  - we discussed at length need for ETOH cessation and better control of BP  - Reminded to schedule cMRI  - continue gradual med titration q2-3 weeks.  - no loop diuretic requirements currently. Discussed daily wts    2. Severe HTN - suspect cause of CM - remains elevated - change losartan>>entresto - start spiro 12.5 mg daily  -  bmp today and again in 7 days - advised to quit drinking and smoking    3. ETOH use - discussed possibility that this could be cause of reduced EF - she continues to drink 3-4 40 oz beers a day  - advised need for cessation. Had long discussion w/ pt and her daughter today - she will work to reduce intake   4. Tobacco use - still smoking < 1/2 ppd  - encouraged cessation   F/u w/ pharmD in 2-3 weeks for further med titration.    Lyda Jester, PA-C  3:56 PM

## 2020-05-20 NOTE — Patient Instructions (Signed)
STOP Losartan START Entresto 24/26 mg, one tab daily START Spironolactone 12.5 mg one half tab daily  Labs today We will only contact you if something comes back abnormal or we need to make some changes. Otherwise no news is good news!  Labs needed in one week  Your physician recommends that you schedule a follow-up appointment in: 3 weeks with Pharmacy Team  If you have any questions or concerns before your next appointment please send [place] a message through [place] or call our office at [phone].    TO LEAVE A MESSAGE FOR THE NURSE SELECT OPTION 2, PLEASE LEAVE A MESSAGE INCLUDING: . YOUR NAME . DATE OF BIRTH . CALL BACK NUMBER . REASON FOR CALL**this is important as we prioritize the call backs  YOU WILL RECEIVE A CALL BACK THE SAME DAY AS LONG AS YOU CALL BEFORE 4:00 PM

## 2020-05-22 NOTE — Telephone Encounter (Signed)
Spoke with patient and sent in application via fax.  Will follow up.

## 2020-05-22 NOTE — Telephone Encounter (Signed)
Was contacted regarding patient assistance for Entresto. Patient signed the application and the income and household information was missing. Called and left her a message to follow up with me.  Will follow up.

## 2020-05-28 ENCOUNTER — Other Ambulatory Visit: Payer: Self-pay

## 2020-05-28 ENCOUNTER — Ambulatory Visit (HOSPITAL_COMMUNITY)
Admission: RE | Admit: 2020-05-28 | Discharge: 2020-05-28 | Disposition: A | Discharge status: Home / Self Care | Payer: 59 | Source: Ambulatory Visit | Attending: Internal Medicine | Admitting: Internal Medicine

## 2020-05-28 ENCOUNTER — Other Ambulatory Visit (HOSPITAL_COMMUNITY): Payer: Self-pay

## 2020-05-28 DIAGNOSIS — I5022 Chronic systolic (congestive) heart failure: Secondary | ICD-10-CM | POA: Insufficient documentation

## 2020-05-28 LAB — BASIC METABOLIC PANEL
Anion gap: 12 (ref 5–15)
BUN: 17 mg/dL (ref 6–20)
CO2: 21 mmol/L — ABNORMAL LOW (ref 22–32)
Calcium: 9.2 mg/dL (ref 8.9–10.3)
Chloride: 109 mmol/L (ref 98–111)
Creatinine, Ser: 0.77 mg/dL (ref 0.44–1.00)
GFR calc Af Amer: >60 mL/min (ref >60)
GFR calc non Af Amer: >60 mL/min (ref >60)
Glucose, Bld: 85 mg/dL (ref 70–99)
Potassium: 3.9 mmol/L (ref 3.5–5.1)
Sodium: 142 mmol/L (ref 135–145)

## 2020-05-28 NOTE — Telephone Encounter (Signed)
Spoke with patient. She was able to get a $10 co-pay card and get the medication from her pharmacy.  Charlann Boxer, CPhT

## 2020-05-28 NOTE — Telephone Encounter (Signed)
Advanced Heart Failure Patient Advocate Encounter   Patient was approved to receive Entresto from [employer].  Patient ID: [id number] Effective dates: 05/24/20 through 09/20/20

## 2020-05-28 NOTE — Progress Notes (Signed)
Orders Placed This Encounter  Procedures  . Basic Metabolic Panel (BMET)    Standing Status:   Future    Standing Expiration Date:   05/28/2021    Order Specific Question:   Release to patient    Answer:   Immediate

## 2020-05-30 NOTE — Progress Notes (Signed)
Referring Physician: Dr. Gladys Damme [facility])  Primary Care: Dr. Gladys Damme [facility])  Primary Cardiologist: Dr. Johnsie Cancel (not seen since 2016) AHFC: Dr. Haroldine Laws  HPI:  Angela Serrano is a 59 y/o woman with HTN, GERD with esophageal stricture s/p dilation in 2016 and tobacco use referred by Dr. Chauncey Reading for further evaluation of her HF.  Saw Dr. Johnsie Cancel in 2016 for moderate pericardial effusion found incidentally on chest CT.  TEE 1/16 EF 55-60% and normal valves. Treated with NSAIDs for pleuropericarditis.   Echo 4/21 EF 35-40% global HK grade II DD.   We saw her for the first time in May 2021. Was not feeling well. NYHA III. Underwent R/L cath which looked good. EF 40-45% no CAD.  Normal RHC. She was ordered to get a cMRI but this had not been done yet.   She recently presented to clinic 05/20/20 with her daughter. Was doing better from a symptom volume standpoint. Denied exertional dyspnea. No orthopnea/ PND. No LEE. Checked weight every morning. Weight was stable at home, 129 lbs. Reported full medication compliance. Currently on Losartan and carvedilol. Needed financial assistance for Entresto (co-pay too high). Her BP remained elevated, 162/90. SBP in the 150s at home. Was still smoking <1/2 ppd and drinking 3-4 40 oz beers a day.   Today she returns to HF clinic for pharmacist medication titration. At last visit with APP, losartan was discontinued and Entresto 24/26 mg BID was initiated. Additionally, spironolactone 12.5 mg daily was initiated. Unfortunately, she has been having some issues with understanding her medication regimen. She was taking losartan and Entresto at the same time for 2 weeks and felt dizzy/lightheaded. She stopped this last week after calling her pharmacy and they told her she was not supposed to be on both. She was also not taking carvedilol (didn't know this was on her medication list). Overall she is feeling good today. No dizziness or lightheadedness since  stopping the losartan and only continuing on the [place]. Occasional pains in her chest that she describes as "twinges". These seem to be related to stress/anxiety. No SOB/DOE. She has been walking 30 minutes per day. She weighs herself daily at home and her weight has been stable at 127-129 lbs. She does not take any diuretic. No LEE, PND or orthopnea.      HF Medications: Carvedilol 3.125 mg BID - not taking Entresto 24/26 mg BID Spironolactone 12.5 mg daily  Has the patient been experiencing any side effects to the medications prescribed?  no  Does the patient have any problems obtaining medications due to transportation or finances?   No - has Geneticist, molecular of regimen: good Understanding of indications: good Potential of compliance: good Patient understands to avoid NSAIDs. Patient understands to avoid decongestants.    Pertinent Lab Values (05/28/20): Marland Kitchen Serum creatinine 0.77, BUN 17, Potassium 3.9, Sodium 142, BNP 209.2 pg/mL (04/18/20)  Vital Signs: . Weight: 130 lbs (last clinic weight: 131.8 lbs) . Blood pressure: 152/86  . Heart rate: 87   Assessment: 1. Chronic systolic HF/chest pressure - Echo 4/21 EF 35-40% global HK grade II DD. Unclear etiology suspect HTN +/- ETOH - R/L Cath 5/21 Normal cors, EF 40-45% RHC ok - NYHA II, Volume status ok, BP elevated - Not currently taking any diuretics - Start taking carvedilol 3.125 mg BID. Previously prescribed carvedilol but was not taking.  - Increase Entresto to 49/51 mg BID - Continue spironolactone 12.5 mg daily  - Eventual addition of SGLT2i  -  we discussed at length need for ETOH cessation and better control of BP  - Working on scheduling cMRI  - continue gradual med titration q2-3 weeks.   2. Severe HTN - suspect cause of CM - remains elevated, 152/86 in clinic - Start carvedilol and increase Entresto as above. Continue spironolactone.  - advised to quit drinking and smoking     3. ETOH use - discussed possibility that this could be cause of reduced EF - she continues to drink 3-4 40 oz beers a day  - advised need for cessation.  - she will work to reduce intake   4. Tobacco use - still smoking < 1/2 ppd  - encouraged cessation    Plan: 1) Medication changes: Based on clinical presentation, vital signs and recent labs will start carvedilol 3.125 mg BID and increase Entresto to 49/51 mg BID.  2) Follow-up: 3 weeks with Pharmacy Clinic   Angela Serrano, PharmD, BCPS, BCCP, CPP Heart Failure Clinic Pharmacist [phone]

## 2020-06-11 ENCOUNTER — Other Ambulatory Visit (HOSPITAL_COMMUNITY): Payer: Self-pay | Admitting: Internal Medicine

## 2020-06-12 ENCOUNTER — Encounter (HOSPITAL_COMMUNITY): Payer: Self-pay | Admitting: *Deleted

## 2020-06-12 NOTE — Progress Notes (Signed)
Received forms from pt's daughter Jaquita Rector, she is requesting FMLA from her job so that she is able to bring patient to appointments and provide care for her on an intermittent basis as needed. Forms completed and faxed to her job at [phone] and copy mailed to her at pt's address per [facility] request

## 2020-06-13 ENCOUNTER — Ambulatory Visit (HOSPITAL_COMMUNITY)
Admission: RE | Admit: 2020-06-13 | Discharge: 2020-06-13 | Disposition: A | Discharge status: Home / Self Care | Payer: 59 | Source: Ambulatory Visit | Attending: Internal Medicine | Admitting: Internal Medicine

## 2020-06-13 ENCOUNTER — Other Ambulatory Visit: Payer: Self-pay

## 2020-06-13 VITALS — BP 152/86 | HR 87 | Wt 130.0 lb

## 2020-06-13 DIAGNOSIS — Z7289 Other problems related to lifestyle: Secondary | ICD-10-CM | POA: Diagnosis not present

## 2020-06-13 DIAGNOSIS — Z79899 Other long term (current) drug therapy: Secondary | ICD-10-CM | POA: Diagnosis not present

## 2020-06-13 DIAGNOSIS — I110 Hypertensive heart disease with heart failure: Secondary | ICD-10-CM | POA: Diagnosis not present

## 2020-06-13 DIAGNOSIS — I5022 Chronic systolic (congestive) heart failure: Secondary | ICD-10-CM

## 2020-06-13 DIAGNOSIS — F17210 Nicotine dependence, cigarettes, uncomplicated: Secondary | ICD-10-CM | POA: Insufficient documentation

## 2020-06-13 MED ORDER — CARVEDILOL 3.125 MG PO TABS: 3.1250 mg | ORAL_TABLET | Freq: Two times a day (BID) | ORAL | 3 refills | Status: DC

## 2020-06-13 MED ORDER — SACUBITRIL-VALSARTAN 49-51 MG PO TABS: 1.0000 | ORAL_TABLET | Freq: Two times a day (BID) | ORAL | 3 refills | Status: DC

## 2020-06-13 NOTE — Patient Instructions (Addendum)
It was a pleasure seeing you today!  MEDICATIONS: -We are changing your medications today -Increase Entresto to 49/51 mg (1 tablet) twice daily. You may take 2 tablets of the 24/26 mg strength twice daily until you pick up the new strength.  -Start taking carvedilol 3.125 mg (1 tablet) twice daily. -Call if you have questions about your medications.   NEXT APPOINTMENT: Return to clinic in 3 weeks with Pharmacy Clinic.  In general, to take care of your heart failure: -Limit your fluid intake to 2 Liters (half-gallon) per day.   -Limit your salt intake to ideally 2-3 grams (2000-3000 mg) per day. -Weigh yourself daily and record, and bring that "weight diary" to your next appointment.  (Weight gain of 2-3 pounds in 1 day typically means fluid weight.) -The medications for your heart are to help your heart and help you live longer.   -Please contact us before stopping any of your heart medications.  Call the clinic at [phone] with questions or to reschedule future appointments.

## 2020-06-27 ENCOUNTER — Encounter (HOSPITAL_COMMUNITY): Payer: Self-pay

## 2020-07-01 ENCOUNTER — Telehealth (HOSPITAL_COMMUNITY): Payer: Self-pay | Admitting: Emergency Medicine

## 2020-07-01 ENCOUNTER — Other Ambulatory Visit: Payer: Self-pay

## 2020-07-01 ENCOUNTER — Ambulatory Visit (HOSPITAL_COMMUNITY)
Admission: RE | Admit: 2020-07-01 | Discharge: 2020-07-01 | Disposition: A | Discharge status: Home / Self Care | Payer: 59 | Source: Ambulatory Visit | Attending: Internal Medicine | Admitting: Internal Medicine

## 2020-07-01 VITALS — BP 124/84 | HR 72 | Wt 129.6 lb

## 2020-07-01 DIAGNOSIS — I110 Hypertensive heart disease with heart failure: Secondary | ICD-10-CM | POA: Diagnosis not present

## 2020-07-01 DIAGNOSIS — F17200 Nicotine dependence, unspecified, uncomplicated: Secondary | ICD-10-CM | POA: Insufficient documentation

## 2020-07-01 DIAGNOSIS — Z7289 Other problems related to lifestyle: Secondary | ICD-10-CM | POA: Diagnosis not present

## 2020-07-01 DIAGNOSIS — K219 Gastro-esophageal reflux disease without esophagitis: Secondary | ICD-10-CM | POA: Insufficient documentation

## 2020-07-01 DIAGNOSIS — I5022 Chronic systolic (congestive) heart failure: Secondary | ICD-10-CM | POA: Diagnosis present

## 2020-07-01 LAB — BASIC METABOLIC PANEL
Anion gap: 10 (ref 5–15)
BUN: 17 mg/dL (ref 6–20)
CO2: 22 mmol/L (ref 22–32)
Calcium: 9.2 mg/dL (ref 8.9–10.3)
Chloride: 110 mmol/L (ref 98–111)
Creatinine, Ser: 0.75 mg/dL (ref 0.44–1.00)
GFR, Estimated: >60 mL/min (ref >60)
Glucose, Bld: 111 mg/dL — ABNORMAL HIGH (ref 70–99)
Potassium: 4 mmol/L (ref 3.5–5.1)
Sodium: 142 mmol/L (ref 135–145)

## 2020-07-01 MED ORDER — CARVEDILOL 6.25 MG PO TABS: 6.2500 mg | ORAL_TABLET | Freq: Two times a day (BID) | ORAL | 3 refills | Status: DC

## 2020-07-01 NOTE — Progress Notes (Signed)
Referring Physician: Dr. Gladys Damme [facility]) Primary Care: Dr. Gladys Damme [facility]) Primary Cardiologist: Dr. Johnsie Cancel (not seen since 2016) AHFC: Dr. Haroldine Laws  HPI:  Angela Serrano is a 59 y/o woman with HTN, GERD with esophageal stricture s/p dilation in 2016 and tobacco use referred by Dr. Chauncey Reading for further evaluation of her HF.  Saw Dr. Johnsie Cancel in 2016 for moderate pericardial effusion found incidentally on chest CT. TEE 1/16 EF 55-60% and normal valves. Treated with NSAIDs for pleuropericarditis.   Echo 4/21 EF 35-40% global HK grade II DD.   We saw her for the first time in May 2021. Was not feeling well. NYHA III. Underwent R/L cath which looked good. EF 40-45% no CAD. Normal RHC. She was ordered to get a cMRI but this had not been done yet.  She recently presented to clinic with APP 05/20/20 with her daughter. Was doing better from a symptom volume standpoint. Denied exertional dyspnea. No orthopnea/ PND. No LEE. Checked weight every morning. Weight was stable at home, 129 lbs. Reported full medication compliance. Her BP remained elevated, 162/90. SBP in the 150s at home. Was still smoking <1/2 ppd and drinking 3-4 40 oz beers a day.   Today she returns to HF clinic for pharmacist medication titration. At last visit with pharmacy clinic on 06/13/20, carvedilol 3.125 mg BID was initiated and Entresto was increased to 49/51 mg BID. Overall, patient feels good. 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 No dizziness, lightheadedness or fatigue. No SOB/DOE. Patient weight has been stable at 127-129 lbs. Not currently taking any diuretic. No LEE, PND or orthopnea.  Appetite has improved. Patient limits salt by only using when seasoning meat and vegetables.   HF Medications: Carvedilol 3.125 mg BID  Entresto 49/51 mg BID  Spironolactone 12.5 mg daily   Has the patient been experiencing any side  effects to the medications prescribed?  NO  Does the patient have any problems obtaining medications due to transportation or finances?   NO  Understanding of regimen: Good Understanding of indications: Good Potential of compliance: Good Patient understands to avoid NSAIDs. Patient understands to avoid decongestants.    Pertinent Lab Values 07/01/20:  Serum creatinine 0.75, BUN 17, Potassium 4.0, Sodium 142, BNP 209.2 (04/18/20)  Vital Signs:  Weight: 129.6 lbs (last clinic weight: 130 lbs)  Blood pressure: 124/84   Heart rate: 72 bpm  Assessment: 1. Chronic systolic HF/chest pressure - Echo 4/21 EF 35-40% global HK grade II DD. Unclear etiology suspect HTN +/- ETOH - R/L Cath 5/21 Normal cors, EF 40-45% RHC ok -NYHA II, euvolemic on exam.  - Not currently taking any diuretics - Increase carvedilol to 6.25 mg BID - Continue Entresto 49/51 mg BID - Continue spironolactone 12.5 mg daily  - Eventual addition of SGLT2i  - we discussed at length need for ETOH cessation  - cMRI scheduled for 07/02/20  - continue gradual med titration q2-3 weeks.   2. Severe HTN - suspect cause of CM - BP 124/84 in-clinic 07/01/20 - Increase carvedilol to 6.25 mg BID as above.  - Continue Entresto 49/51 mg BID - Continue spironolactone 12.5 mg daily.  - advised to quit drinking and smoking    3. ETOH use - discussed possibility that this could be cause of reduced EF - she continues to drink 3-4 40 oz beers a day  - advised need for cessation.  -  she will work to reduce intake   4. Tobacco use - still smoking <1/2 ppd  - encouraged cessation   Plan: 1) Medication changes: Based on clinical presentation, vital signs and recent labs will increase carvedilol to 6.25 mg BID 2) Labs 07/01/20: Serum creatinine 0.75, BUN 17, Potassium 4.0, Sodium 142 3) Follow-up with APP in 4 weeks. Cardiac MRI tomorrow.     Angela Serrano, PharmD, BCPS, BCCP, CPP Heart Failure Clinic  Pharmacist [phone]

## 2020-07-01 NOTE — Patient Instructions (Signed)
It was a pleasure seeing you today!  MEDICATIONS: -We are changing your medications today -Increase carvedilol to 6.25 mg (1 tablet) twice daily.  You may take 2 tablets of the 3.125 mg strength twice daily until you pick up the new strength.   -Call if you have questions about your medications.  LABS: -We will call you if your labs need attention.  NEXT APPOINTMENT: Return to clinic in 4 weeks with APP Clinic.  In general, to take care of your heart failure: -Limit your fluid intake to 2 Liters (half-gallon) per day.   -Limit your salt intake to ideally 2-3 grams (2000-3000 mg) per day. -Weigh yourself daily and record, and bring that "weight diary" to your next appointment.  (Weight gain of 2-3 pounds in 1 day typically means fluid weight.) -The medications for your heart are to help your heart and help you live longer.   -Please contact us before stopping any of your heart medications.  Call the clinic at [phone] with questions or to reschedule future appointments.

## 2020-07-01 NOTE — Telephone Encounter (Signed)
Attempted to call patient regarding upcoming cardiac MR appointment. Left message on voicemail with name and callback number Dilan Fullenwider RN Navigator Cardiac Imaging [facility] [phone] Office [phone] Cell  

## 2020-07-02 ENCOUNTER — Ambulatory Visit (HOSPITAL_COMMUNITY)
Admission: RE | Admit: 2020-07-02 | Discharge: 2020-07-02 | Disposition: A | Discharge status: Home / Self Care | Payer: 59 | Source: Ambulatory Visit | Attending: Internal Medicine | Admitting: Internal Medicine

## 2020-07-02 ENCOUNTER — Telehealth (HOSPITAL_COMMUNITY): Payer: Self-pay | Admitting: Emergency Medicine

## 2020-07-02 DIAGNOSIS — I5022 Chronic systolic (congestive) heart failure: Secondary | ICD-10-CM

## 2020-07-02 DIAGNOSIS — I110 Hypertensive heart disease with heart failure: Secondary | ICD-10-CM | POA: Diagnosis not present

## 2020-07-02 MED ORDER — GADOBUTROL 1 MMOL/ML IV SOLN
8.0000 mL | Freq: Once | INTRAVENOUS | Status: AC | PRN
  Administered 2020-07-02: 8 mL via INTRAVENOUS

## 2020-07-02 NOTE — Telephone Encounter (Signed)
Pt returning phone call regarding upcoming cardiac imaging study; pt verbalizes understanding of appt date/time, parking situation and where to check in, and verified current allergies; name and call back number provided for further questions should they arise Angela Bond RN Navigator Cardiac Imaging [facility] [phone] office [phone] cell   Pt denies implants other than dentures, denies claustro

## 2020-07-30 ENCOUNTER — Other Ambulatory Visit: Payer: Self-pay

## 2020-07-30 ENCOUNTER — Ambulatory Visit (HOSPITAL_COMMUNITY)
Admission: RE | Admit: 2020-07-30 | Discharge: 2020-07-30 | Disposition: A | Discharge status: Home / Self Care | Payer: 59 | Source: Ambulatory Visit | Attending: Cardiology | Admitting: Cardiology

## 2020-07-30 ENCOUNTER — Encounter (HOSPITAL_COMMUNITY): Payer: Self-pay

## 2020-07-30 VITALS — BP 120/74 | HR 79 | Wt 128.0 lb

## 2020-07-30 DIAGNOSIS — I110 Hypertensive heart disease with heart failure: Secondary | ICD-10-CM | POA: Insufficient documentation

## 2020-07-30 DIAGNOSIS — I428 Other cardiomyopathies: Secondary | ICD-10-CM | POA: Insufficient documentation

## 2020-07-30 DIAGNOSIS — Z720 Tobacco use: Secondary | ICD-10-CM

## 2020-07-30 DIAGNOSIS — K222 Esophageal obstruction: Secondary | ICD-10-CM | POA: Diagnosis not present

## 2020-07-30 DIAGNOSIS — Z79899 Other long term (current) drug therapy: Secondary | ICD-10-CM | POA: Diagnosis not present

## 2020-07-30 DIAGNOSIS — K219 Gastro-esophageal reflux disease without esophagitis: Secondary | ICD-10-CM | POA: Diagnosis not present

## 2020-07-30 DIAGNOSIS — Z7289 Other problems related to lifestyle: Secondary | ICD-10-CM | POA: Diagnosis not present

## 2020-07-30 DIAGNOSIS — I5022 Chronic systolic (congestive) heart failure: Secondary | ICD-10-CM | POA: Diagnosis not present

## 2020-07-30 DIAGNOSIS — F17210 Nicotine dependence, cigarettes, uncomplicated: Secondary | ICD-10-CM | POA: Diagnosis not present

## 2020-07-30 LAB — BASIC METABOLIC PANEL
Anion gap: 8 (ref 5–15)
BUN: 18 mg/dL (ref 6–20)
CO2: 24 mmol/L (ref 22–32)
Calcium: 9.1 mg/dL (ref 8.9–10.3)
Chloride: 110 mmol/L (ref 98–111)
Creatinine, Ser: 0.63 mg/dL (ref 0.44–1.00)
GFR, Estimated: >60 mL/min (ref >60)
Glucose, Bld: 115 mg/dL — ABNORMAL HIGH (ref 70–99)
Potassium: 3.8 mmol/L (ref 3.5–5.1)
Sodium: 142 mmol/L (ref 135–145)

## 2020-07-30 MED ORDER — SPIRONOLACTONE 25 MG PO TABS: 25.0000 mg | ORAL_TABLET | Freq: Every day | ORAL | 3 refills | Status: DC

## 2020-07-30 NOTE — Patient Instructions (Signed)
INCREASE Spironolactone to 25 mg, one tab daily  Labs today We will only contact you if something comes back abnormal or we need to make some changes. Otherwise no news is good news!  Labs needed in one week  You have been referred to [facility]  Address: [address], [place], [place] [address] Phone: [phone] -they will be in contact with an appointment  Your physician recommends that you schedule a follow-up appointment in: 3 months with Dr Haroldine Laws  If you have any questions or concerns before your next appointment please send [place] a message through [facility] or call our office at [phone].    TO LEAVE A MESSAGE FOR THE NURSE SELECT OPTION 2, PLEASE LEAVE A MESSAGE INCLUDING: . YOUR NAME . DATE OF BIRTH . CALL BACK NUMBER . REASON FOR CALL**this is important as we prioritize the call backs  YOU WILL RECEIVE A CALL BACK THE SAME DAY AS LONG AS YOU CALL BEFORE 4:00 PM

## 2020-07-30 NOTE — Progress Notes (Signed)
ADVANCED HF CLINIC  NOTE  Referring Physician: Dr. Gladys Damme [facility])  Primary Care: Dr. Gladys Damme [facility])  Primary Cardiologist: Dr. Johnsie Cancel (not seen since 2016) AHFC: Dr. Haroldine Laws   Reason for Visit: f/u for chronic systolic heart failure   HPI:  Angela Serrano is a 59 y/o woman with HTN, GERD with esophageal stricture s/p dilation in 2016 and tobacco use referred by Dr. Chauncey Reading for further evaluation of her HF.  Saw Dr. Johnsie Cancel in 2016 for moderate pericardial effusion found incidentally on chest CT.  TEE 1/16 EF 55-60% and normal valves. Treated with NSAIDs for pleuropericarditis.   Echo 4/21 EF 35-40% global HK grade II DD.   We saw her for the first time in May. Was not feeling well. NYHA III. Underwent R/L cath which looked good. EF 40-45% no CAD.  Normal RHC.   Over recent visits, we have been gradually titrating meds. Last visit was w/ pharmD on 07/01/20. Coreg was increased to 6.25 mg bid. She is also on Entresto and spiro.   She had cMRI 07/02/20 which showed EF up to 50% w/ diffuse mid myocardial gadolinium uptake non-specific can be seen in non ischemic DCM. RV normal.   Presents back to clinic today for f/u. Here w/ her daughter. Reports doing well. NYHA Class II. Wt stable. No LEE, orthopnea/PND. Reports full med compliance. BP controlled. Admits to still drinking 3 12 oz beers a day. Still smokes 1ppd. Smoker since age 59.     R/L cath  02/16/20 Ao = 143/76 (103) LV = 132/4 RA = 2 RV = 26/4 PA = 26/8 (17) PCW = 6 Fick cardiac output/index = 3.2/2.4 PVR = 2.9 WU Ao sat = 98% PA sat = 69%, 69%  Assessment: 1. Normal coronary arteries 2. Mild to moderate NICM EF 40-45% 3. Normal hemodynamics  cMRI 10/21 IMPRESSION: 1.  Mild LVE with diffuse hypokinesis worse in the apex EF 50%  2. Diffuse mid myocardial gadolinium uptake non-specific can be seen in non ischemic DCM  3.  Normal RV size and function  4.  Trivial lateral pericardial  effusion  5.  Tri leaflet AV with mild appearing AR  6.  Parametric numbers show mildly elevated T2 and ECV    Past Medical History:  Diagnosis Date  . Acid reflux disease   . Benign essential HTN   . CHF (congestive heart failure), NYHA class I ([place]) 01/17/2015  . Poor dentition   . Seasonal allergies 2011   sneezing and hoarseness    Current Outpatient Medications  Medication Sig Dispense Refill  . carvedilol (COREG) 6.25 MG tablet Take 1 tablet (6.25 mg total) by mouth 2 (two) times daily. 180 tablet 3  . cloNIDine (CATAPRES) 0.2 MG tablet Take 1 tablet (0.2 mg total) by mouth 2 (two) times daily as needed. 30 tablet 0  . sacubitril-valsartan (ENTRESTO) 49-51 MG Take 1 tablet by mouth 2 (two) times daily. 180 tablet 3  . spironolactone (ALDACTONE) 25 MG tablet Take 0.5 tablets (12.5 mg total) by mouth daily. 90 tablet 3   No current facility-administered medications for this encounter.    No Known Allergies    Social History   Socioeconomic History  . Marital status: Single    Spouse name: Not on file  . Number of children: 3  . Years of education: Not on file  . Highest education level: Not on file  Occupational History  . Occupation: Neurosurgeon: [employer]    Comment: [employer]   Tobacco Use  . Smoking status: Current Every Day Smoker    Packs/day: 0.25    Years: 30.00    Pack years: 7.50    Types: Cigarettes  . Smokeless tobacco: Never Used  . Tobacco comment: down to 3 cigs a day  Vaping Use  . Vaping Use: Never used  Substance and Sexual Activity  . Alcohol use: Yes    Alcohol/week: 2.0 standard drinks    Types: 2 Cans of beer per week    Comment: one beer per night for sleep  . Drug use: No  . Sexual activity: Not Currently    Birth control/protection: None  Other Topics Concern  . Not on file  Social History Narrative   Lives with daughters, and 2 grandchildren. Total of 4 grandchildren, likes to dance for exercise.  Works at [employer] and [employer] now.   Social Determinants of Health   Financial Resource Strain:   . Difficulty of Paying Living Expenses: Not on file  Food Insecurity:   . Worried About Charity fundraiser in the Last Year: Not on file  . Ran Out of Food in the Last Year: Not on file  Transportation Needs:   . Lack of Transportation (Medical): Not on file  . Lack of Transportation (Non-Medical): Not on file  Physical Activity:   . Days of Exercise per Week: Not on file  . Minutes of Exercise per Session: Not on file  Stress:   . Feeling of Stress : Not on file  Social Connections:   . Frequency of Communication with Friends and Family: Not on file  . Frequency of Social Gatherings with Friends and Family: Not on file  . Attends Religious Services: Not on file  . Active Member of Clubs or Organizations: Not on file  . Attends Archivist Meetings: Not on file  . Marital Status: Not on file  Intimate Partner Violence:   . Fear of Current or Ex-Partner: Not on file  . Emotionally Abused: Not on file  . Physically Abused: Not on file  . Sexually Abused: Not on file      Family History  Problem Relation Age of Onset  . Cancer Mother 17       Bone  . Alcohol abuse Father 30  . Hypertension Sister   . Hyperlipidemia Sister   . Hypertension Sister   . Thyroid disease Daughter   . Diabetes Maternal Aunt   . Cancer Maternal Aunt 60       Bone   . Diabetes Maternal Grandmother   . Cancer Maternal Aunt 63       Bone     Vitals:   07/30/20 1519  BP: 120/74  Pulse: 79  SpO2: 94%  Weight: 58.1 kg (128 lb)   PHYSICAL EXAM: General:  Well appearing, thin middle aged female. No respiratory difficulty HEENT: normal Neck: supple. no JVD. Carotids 2+ bilat; no bruits. No lymphadenopathy or thyromegaly appreciated. Cor: PMI nondisplaced. Regular rate & rhythm. No rubs, gallops or murmurs. Lungs: clear Abdomen: soft, nontender, nondistended. No hepatosplenomegaly. No  bruits or masses. Good bowel sounds. Extremities: no cyanosis, clubbing, rash, edema Neuro: alert & oriented x 3, cranial nerves grossly intact. moves all 4 extremities w/o difficulty. Affect pleasant.   ASSESSMENT & PLAN:  1. Chronic systolic HF/chest pressure - Echo 4/21 EF 35-40% global HK grade II DD. Unclear etiology suspect HTN +/- ETOH - R/L Cath 5/21 Normal cors,  EF 40-45% RHC ok - cMRI 10/21 showed low normal EF 50% w/ diffuse mid myocardial gadolinium uptake non-specific can be seen in non ischemic DCM. RV normal.  - NYHA II. Euvolemic on exam. - Continue Entresto 49-51 mg bid - Increase Spiro to 25 mg daily  - Continue carvedilol 6.25 bid  - EF out of range for SGLT2i  - no loop diuretic requirements currently. Discussed daily wts and low sodium diet - we discussed at length need for ETOH cessation. Advised to reduce consumption   2. Severe HTN - suspect cause of CM - better controlled on medication regimen  - continue current regimen as outlined above and increase spiro to 25 mg daily  - check BMP today and again in 7 days  - advised to quit drinking and smoking    3. ETOH use - discussed possibility that this could be cause of reduced EF - she continues to drink 3-4  beers a day  - advised need for cessation. Had long discussion w/ pt and her daughter today - she will work to reduce intake   4. Tobacco use - still smoking 1/2 ppd  - encouraged cessation  - long time smoker since age 67 - will refer to lung cancer screening clinic   F/u w/ Dr. Haroldine Laws in 3 months.    Lyda Jester, PA-C  3:29 PM

## 2020-08-06 ENCOUNTER — Ambulatory Visit (HOSPITAL_COMMUNITY)
Admission: RE | Admit: 2020-08-06 | Discharge: 2020-08-06 | Disposition: A | Discharge status: Home / Self Care | Payer: 59 | Source: Ambulatory Visit | Attending: Internal Medicine | Admitting: Internal Medicine

## 2020-08-06 ENCOUNTER — Other Ambulatory Visit: Payer: Self-pay

## 2020-08-06 DIAGNOSIS — I5022 Chronic systolic (congestive) heart failure: Secondary | ICD-10-CM | POA: Insufficient documentation

## 2020-08-06 LAB — BASIC METABOLIC PANEL
Anion gap: 10 (ref 5–15)
BUN: 19 mg/dL (ref 6–20)
CO2: 22 mmol/L (ref 22–32)
Calcium: 9.4 mg/dL (ref 8.9–10.3)
Chloride: 109 mmol/L (ref 98–111)
Creatinine, Ser: 0.71 mg/dL (ref 0.44–1.00)
GFR, Estimated: >60 mL/min (ref >60)
Glucose, Bld: 109 mg/dL — ABNORMAL HIGH (ref 70–99)
Potassium: 3.9 mmol/L (ref 3.5–5.1)
Sodium: 141 mmol/L (ref 135–145)

## 2020-08-22 ENCOUNTER — Telehealth (HOSPITAL_COMMUNITY): Payer: Self-pay | Admitting: Pharmacy Technician

## 2020-08-22 NOTE — Telephone Encounter (Signed)
It's time to re-enroll patient to receive medication assistance for Entresto from [employer]. Left voicemail for patient to call me back so that we can start the re-enrollment process.  Will follow up.

## 2020-08-27 NOTE — Telephone Encounter (Signed)
Spoke with patient this morning regarding assistance. She has met her deductible for the year and her current co-pay of Delene Loll is $63. Advised the patient to go ahead and get a 90 day supply of Entresto while she can.  In February, if he co-pay is not affordable, she will call and we can seek assistance or a co-pay card.  Charlann Boxer, CPhT

## 2020-10-03 ENCOUNTER — Telehealth (HOSPITAL_COMMUNITY): Payer: Self-pay | Admitting: *Deleted

## 2020-10-03 NOTE — Telephone Encounter (Signed)
pts daughter Reuben Likes left VM that patient was having more trouble breathing and requested a call back. I called back no answer/left vm for Shonda to return my call.

## 2020-10-14 ENCOUNTER — Encounter (HOSPITAL_COMMUNITY): Payer: Self-pay | Admitting: *Deleted

## 2020-10-14 NOTE — Progress Notes (Signed)
Pt's daughter request FMLA forms to cover when she is out of work to care for pt or bring her to appts. Forms completed and signed, left VM at [phone] that forms were ready for p/u

## 2020-10-30 ENCOUNTER — Ambulatory Visit (HOSPITAL_COMMUNITY)
Admission: RE | Admit: 2020-10-30 | Discharge: 2020-10-30 | Disposition: A | Discharge status: Home / Self Care | Payer: 59 | Source: Ambulatory Visit | Attending: Internal Medicine | Admitting: Internal Medicine

## 2020-10-30 ENCOUNTER — Encounter (HOSPITAL_COMMUNITY): Payer: Self-pay | Admitting: Internal Medicine

## 2020-10-30 ENCOUNTER — Other Ambulatory Visit: Payer: Self-pay

## 2020-10-30 VITALS — BP 124/70 | HR 77 | Wt 125.2 lb

## 2020-10-30 DIAGNOSIS — I5022 Chronic systolic (congestive) heart failure: Secondary | ICD-10-CM | POA: Insufficient documentation

## 2020-10-30 DIAGNOSIS — F17210 Nicotine dependence, cigarettes, uncomplicated: Secondary | ICD-10-CM | POA: Diagnosis not present

## 2020-10-30 DIAGNOSIS — I110 Hypertensive heart disease with heart failure: Secondary | ICD-10-CM | POA: Insufficient documentation

## 2020-10-30 DIAGNOSIS — R0789 Other chest pain: Secondary | ICD-10-CM | POA: Insufficient documentation

## 2020-10-30 DIAGNOSIS — F1010 Alcohol abuse, uncomplicated: Secondary | ICD-10-CM | POA: Diagnosis not present

## 2020-10-30 DIAGNOSIS — I10 Essential (primary) hypertension: Secondary | ICD-10-CM

## 2020-10-30 DIAGNOSIS — K222 Esophageal obstruction: Secondary | ICD-10-CM | POA: Diagnosis not present

## 2020-10-30 DIAGNOSIS — Z7289 Other problems related to lifestyle: Secondary | ICD-10-CM | POA: Insufficient documentation

## 2020-10-30 DIAGNOSIS — K219 Gastro-esophageal reflux disease without esophagitis: Secondary | ICD-10-CM | POA: Insufficient documentation

## 2020-10-30 DIAGNOSIS — Z720 Tobacco use: Secondary | ICD-10-CM | POA: Diagnosis not present

## 2020-10-30 DIAGNOSIS — Z79899 Other long term (current) drug therapy: Secondary | ICD-10-CM | POA: Insufficient documentation

## 2020-10-30 LAB — CBC
HCT: 37 % (ref 36.0–46.0)
Hemoglobin: 12.8 g/dL (ref 12.0–15.0)
MCH: 32.3 pg (ref 26.0–34.0)
MCHC: 34.6 g/dL (ref 30.0–36.0)
MCV: 93.4 fL (ref 80.0–100.0)
Platelets: 315 10*3/uL (ref 150–400)
RBC: 3.96 MIL/uL (ref 3.87–5.11)
RDW: 13 % (ref 11.5–15.5)
WBC: 6.6 10*3/uL (ref 4.0–10.5)
nRBC: 0 % (ref 0.0–0.2)

## 2020-10-30 LAB — BASIC METABOLIC PANEL
Anion gap: 11 (ref 5–15)
BUN: 10 mg/dL (ref 6–20)
CO2: 21 mmol/L — ABNORMAL LOW (ref 22–32)
Calcium: 9 mg/dL (ref 8.9–10.3)
Chloride: 107 mmol/L (ref 98–111)
Creatinine, Ser: 0.53 mg/dL (ref 0.44–1.00)
GFR, Estimated: >60 mL/min (ref >60)
Glucose, Bld: 91 mg/dL (ref 70–99)
Potassium: 3.6 mmol/L (ref 3.5–5.1)
Sodium: 139 mmol/L (ref 135–145)

## 2020-10-30 LAB — BRAIN NATRIURETIC PEPTIDE: B Natriuretic Peptide: 29.8 pg/mL (ref 0.0–100.0)

## 2020-10-30 MED ORDER — ENTRESTO 97-103 MG PO TABS: 1.0000 | ORAL_TABLET | Freq: Two times a day (BID) | ORAL | 5 refills | Status: DC

## 2020-10-30 NOTE — Patient Instructions (Addendum)
Increase Entresto to 97/103 (1 tablet)Twice daily  Labs done today, your results will be available in MyChart, we will contact you for abnormal readings.   Your physician has requested that you have an echocardiogram. Echocardiography is a painless test that uses sound waves to create images of your heart. It provides your doctor with information about the size and shape of your heart and how well your heart's chambers and valves are working. This procedure takes approximately one hour. There are no restrictions for this procedure.   Call our office in July 2022 to schedule an appointment with an echocardiogram  If you have any questions or concerns before your next appointment please send [place] a message through [facility] or call our office at [phone].    TO LEAVE A MESSAGE FOR THE NURSE SELECT OPTION 2, PLEASE LEAVE A MESSAGE INCLUDING: . YOUR NAME . DATE OF BIRTH . CALL BACK NUMBER . REASON FOR CALL**this is important as we prioritize the call backs  [place] AS LONG AS YOU CALL BEFORE 4:00 PM  At the [facility], you and your health needs are our priority. As part of our continuing mission to provide you with exceptional heart care, we have created designated Provider Care Teams. These Care Teams include your primary Cardiologist (physician) and Advanced Practice Providers (APPs- Physician Assistants and Nurse Practitioners) who all work together to provide you with the care you need, when you need it.   You may see any of the following providers on your designated Care Team at your next follow up: Marland Kitchen Dr Glori Bickers . Dr Loralie Champagne . Darrick Grinder, NP . Lyda Jester, [place] . Audry Riles, PharmD   Please be sure to bring in all your medications bottles to every appointment.

## 2020-10-30 NOTE — Progress Notes (Signed)
ADVANCED HF CLINIC  NOTE  Referring Physician: Dr. Gladys Damme [facility])  Primary Care: Dr. Gladys Damme [facility])  Primary Cardiologist: Dr. Johnsie Cancel (not seen since 2016) AHFC: Dr. Haroldine Laws   Reason for Visit: f/u for chronic systolic heart failure   HPI:  Angela Serrano is a 60 y/o woman with HTN, GERD with esophageal stricture s/p dilation in 2016 and tobacco use referred by Dr. Chauncey Reading for further evaluation of her HF.  Saw Dr. Johnsie Cancel in 2016 for moderate pericardial effusion found incidentally on chest CT.  TEE 1/16 EF 55-60% and normal valves. Treated with NSAIDs for pleuropericarditis.   Echo 4/21 EF 35-40% global HK grade II DD.   We saw her for the first time in May 2021. Was not feeling well. NYHA III. Underwent R/L cath: EF 40-45% no CAD.  Normal RHC.   She had cMRI 07/02/20 which showed EF up to 50% w/ diffuse mid myocardial gadolinium uptake non-specific can be seen in non ischemic DCM. RV normal.   Presents back to clinic today for f/u. Here w/ her daughter. Feels much better. Breathing better. Can do all activities without too much problem. Still working. Smokes 1/2ppd. Drinking two 24 oz beers per day (down from 4). No edema, orthopnea or PND. Complaint with meds     R/L cath  02/16/20 Ao = 143/76 (103) LV = 132/4 RA = 2 RV = 26/4 PA = 26/8 (17) PCW = 6 Fick cardiac output/index = 3.2/2.4 PVR = 2.9 WU Ao sat = 98% PA sat = 69%, 69%  Assessment: 1. Normal coronary arteries 2. Mild to moderate NICM EF 40-45% 3. Normal hemodynamics  cMRI 10/21 IMPRESSION: 1.  Mild LVE with diffuse hypokinesis worse in the apex EF 50%  2. Diffuse mid myocardial gadolinium uptake non-specific can be seen in non ischemic DCM  3.  Normal RV size and function  4.  Trivial lateral pericardial effusion  5.  Tri leaflet AV with mild appearing AR  6.  Parametric numbers show mildly elevated T2 and ECV    Past Medical History:  Diagnosis Date  . Acid reflux  disease   . Benign essential HTN   . CHF (congestive heart failure), NYHA class I ([place]) 01/17/2015  . Poor dentition   . Seasonal allergies 2011   sneezing and hoarseness    Current Outpatient Medications  Medication Sig Dispense Refill  . carvedilol (COREG) 6.25 MG tablet Take 1 tablet (6.25 mg total) by mouth 2 (two) times daily. 180 tablet 3  . sacubitril-valsartan (ENTRESTO) 49-51 MG Take 1 tablet by mouth 2 (two) times daily. 180 tablet 3  . spironolactone (ALDACTONE) 25 MG tablet Take 1 tablet (25 mg total) by mouth daily. 90 tablet 3   No current facility-administered medications for this encounter.    No Known Allergies    Social History   Socioeconomic History  . Marital status: Single    Spouse name: Not on file  . Number of children: 3  . Years of education: Not on file  . Highest education level: Not on file  Occupational History  . Occupation: Neurosurgeon: [place]   Tobacco Use  . Smoking status: Current Every Day Smoker    Packs/day: 0.25    Years: 30.00    Pack years: 7.50    Types: Cigarettes  . Smokeless tobacco: Never Used  . Tobacco comment: down to 3 cigs a day  Vaping Use  . Vaping Use: Never used  Substance and Sexual Activity  . Alcohol use: Yes    Alcohol/week: 2.0 standard drinks    Types: 2 Cans of beer per week    Comment: one beer per night for sleep  . Drug use: No  . Sexual activity: Not Currently    Birth control/protection: None  Other Topics Concern  . Not on file  Social History Narrative   Lives with daughters, and 2 grandchildren. Total of 4 grandchildren, likes to dance for exercise. Works at [employer] and [employer] now.   Social Determinants of Health   Financial Resource Strain: Not on file  Food Insecurity: Not on file  Transportation Needs: Not on file  Physical Activity: Not on file  Stress: Not on file  Social Connections: Not on file  Intimate Partner Violence: Not on  file      Family History  Problem Relation Age of Onset  . Cancer Mother 49       Bone  . Alcohol abuse Father 85  . Hypertension Sister   . Hyperlipidemia Sister   . Hypertension Sister   . Thyroid disease Daughter   . Diabetes Maternal Aunt   . Cancer Maternal Aunt 60       Bone   . Diabetes Maternal Grandmother   . Cancer Maternal Aunt 63       Bone     Vitals:   10/30/20 1107  BP: 124/70  Pulse: 77  SpO2: 96%  Weight: 56.8 kg (125 lb 3.2 oz)   PHYSICAL EXAM: General:  Well appearing. No resp difficulty HEENT: normal Neck: supple. no JVD. Carotids 2+ bilat; no bruits. No lymphadenopathy or thryomegaly appreciated. Cor: PMI nondisplaced. Regular rate & rhythm. No rubs, gallops or murmurs. Lungs: clear but mildly decreased  Abdomen: soft, nontender, nondistended. No hepatosplenomegaly. No bruits or masses. Good bowel sounds. Extremities: no cyanosis, clubbing, rash, edema Neuro: alert & orientedx3, cranial nerves grossly intact. moves all 4 extremities w/o difficulty. Affect pleasant    ASSESSMENT & PLAN:  1. Chronic systolic HF/chest pressure - Echo 4/21 EF 35-40% global HK grade II DD. Unclear etiology suspect HTN +/- ETOH - R/L Cath 5/21 Normal cors, EF 40-45% RHC ok - cMRI 10/21 showed low normal EF 50% w/ diffuse mid myocardial gadolinium uptake non-specific can be seen in non ischemic DCM. RV normal.  - EF improving with GDMT. Will repeat echo.  - Stable NYHA II. Volume status ok  - Increase Entresto 97/103 mg bid - Continue spiro 25 mg daily  - Continue carvedilol 6.25 bid  - Repeat echo - If EF still down will add SGLT2i - we again discussed need for ETOH cessation. Advised to reduce consumption  - Labs today  2. Severe HTN - BP now well cotnrolled  3. ETOH use - discussed possibility that this could be cause of reduced EF - discussed need to reduce intake  4. Tobacco use - still smoking 1/2 ppd  - encouraged cessation   Glori Bickers,  MD  11:33 AM

## 2020-11-12 ENCOUNTER — Telehealth (HOSPITAL_COMMUNITY): Payer: Self-pay | Admitting: *Deleted

## 2020-11-12 NOTE — Telephone Encounter (Signed)
pts daughter Mingo Amber left a VM requesting return call about paperwork she needs filled out. I called pt back no answer/left vm.

## 2021-02-10 ENCOUNTER — Other Ambulatory Visit (HOSPITAL_COMMUNITY): Payer: Self-pay | Admitting: Internal Medicine

## 2021-03-19 ENCOUNTER — Other Ambulatory Visit (HOSPITAL_COMMUNITY): Payer: Self-pay

## 2021-03-19 ENCOUNTER — Telehealth (HOSPITAL_COMMUNITY): Payer: Self-pay | Admitting: Pharmacy Technician

## 2021-03-19 NOTE — Telephone Encounter (Signed)
Advanced Heart Failure Patient Advocate Encounter  Sent in provider portion of [employer] application via fax.  Will follow up.

## 2021-03-25 NOTE — Telephone Encounter (Addendum)
Advanced Heart Failure Patient Advocate Encounter  Received fax from [employer], patient's Entresto application has been APPROVED. Coverage dates are from 03/21/21 to 03/21/22.   Phone# [phone] Fax# [phone]  Called patient, left message with program phone number.

## 2021-04-21 IMAGING — MR MR CARD MORPHOLOGY WO/W CM
45 of 48 series · 45 of 48 positions shown · IV contrast (Gadavist)
Comparison: none

CLINICAL DATA: Cardiomyopathy

EXAM:
CARDIAC MRI
TECHNIQUE: The patient was scanned on a 1.5 Tesla Siemens magnet. A dedicated
cardiac coil was used. Functional imaging was done using Fiesta
sequences. [DATE], and 4 chamber views were done to assess for RWMA's.
Modified Belkis rule using a short axis stack was used to
calculate an ejection fraction on a dedicated work station using
Circle software. The patient received 8 cc of Gadavist After 10
minutes inversion recovery sequences were used to assess for
infiltration and scar tissue.
CONTRAST:  Gadavist

[Series 4: [id number]_bh · axial · 8.0mm · 1.41mm/px · 1 of 16 slices shown]
[im 1/16]
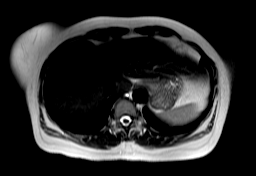

[Series 8: bSSFP · sagittal · 8.0mm · 1.61mm/px · 1 of 25 slices shown (1 of 22)]
[im 1/25]
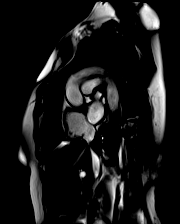

[Series 9: bSSFP · sagittal · 8.0mm · 1.61mm/px · 1 of 25 slices shown (2 of 22)]
[im 1/25]
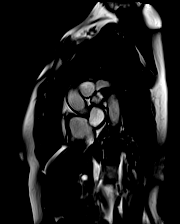

[Series 10: bSSFP · sagittal · 8.0mm · 1.61mm/px · 1 of 25 slices shown (3 of 22)]
[im 1/25]
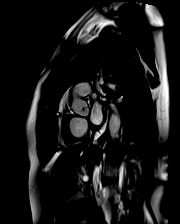

[Series 11: bSSFP · sagittal · 8.0mm · 1.61mm/px · 1 of 25 slices shown (4 of 22)]
[im 1/25]
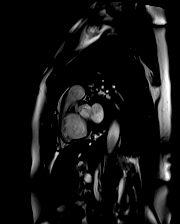

[Series 12: bSSFP · sagittal · 8.0mm · 1.61mm/px · 1 of 25 slices shown (5 of 22)]
[im 1/25]
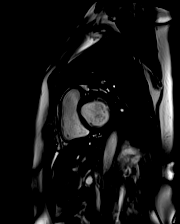

[Series 13: bSSFP · sagittal · 8.0mm · 1.61mm/px · 1 of 25 slices shown (6 of 22)]
[im 1/25]
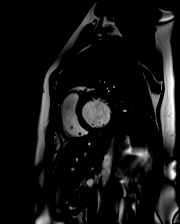

[Series 14: bSSFP · sagittal · 8.0mm · 1.61mm/px · 1 of 25 slices shown (7 of 22)]
[im 1/25]
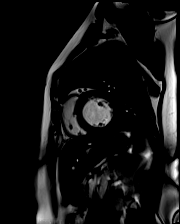

[Series 15: bSSFP · sagittal · 8.0mm · 1.61mm/px · 1 of 25 slices shown (8 of 22)]
[im 1/25]
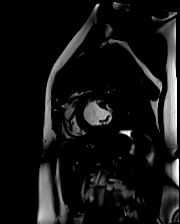

[Series 16: bSSFP · sagittal · 8.0mm · 1.61mm/px · 1 of 25 slices shown (9 of 22)]
[im 1/25]
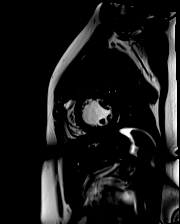

[Series 17: bSSFP · sagittal · 8.0mm · 1.61mm/px · 1 of 25 slices shown (10 of 22)]
[im 1/25]
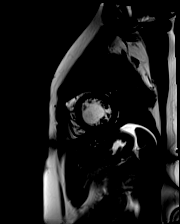

[Series 18: bSSFP · sagittal · 8.0mm · 1.61mm/px · 1 of 25 slices shown (11 of 22)]
[im 1/25]
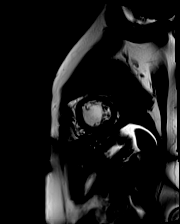

[Series 19: bSSFP · sagittal · 8.0mm · 1.61mm/px · 1 of 25 slices shown (12 of 22)]
[im 1/25]
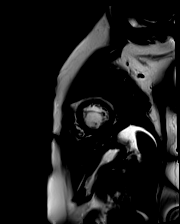

[Series 20: bSSFP · sagittal · 8.0mm · 1.61mm/px · 1 of 25 slices shown (13 of 22)]
[im 1/25]
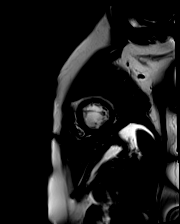

[Series 21: bSSFP · sagittal · 8.0mm · 1.61mm/px · 1 of 25 slices shown (14 of 22)]
[im 1/25]
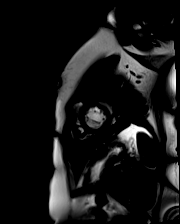

[Series 22: bSSFP · sagittal · 8.0mm · 1.61mm/px · 1 of 25 slices shown (15 of 22)]
[im 1/25]
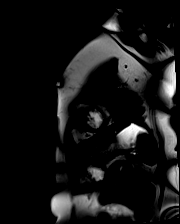

[Series 23: bSSFP · sagittal · 8.0mm · 1.61mm/px · 1 of 25 slices shown (16 of 22)]
[im 1/25]
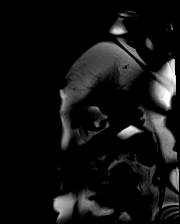

[Series 24: bSSFP · sagittal · 8.0mm · 1.61mm/px · 1 of 25 slices shown (17 of 22)]
[im 1/25]
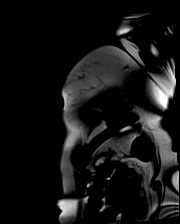

[Series 25: bSSFP · sagittal · 8.0mm · 1.61mm/px · 1 of 25 slices shown (18 of 22)]
[im 1/25]
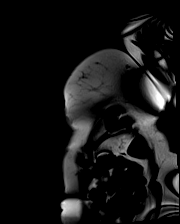

[Series 26: (id)_long_t1 · oblique · 8.0mm · 1.56mm/px · 1 of 24 slices shown]
[im 1/24]
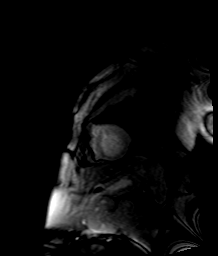

[Series 27: (id)_long_t1_moco · oblique · 8.0mm · 1.56mm/px · 1 of 24 slices shown]
[im 1/24]
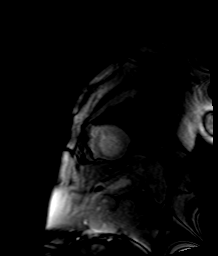

[Series 30: (id)_trufi · oblique · 8.0mm · 2.08mm/px · 1 of 9 slices shown]
[im 1/9]
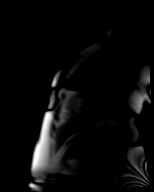

[Series 31: (id)_trufi_moco · oblique · 8.0mm · 2.08mm/px · 1 of 9 slices shown]
[im 1/9]
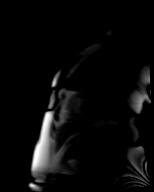

[Series 34: bSSFP · oblique · 6.0mm · 1.41mm/px · 1 of 25 slices shown (19 of 22)]
[im 1/25]
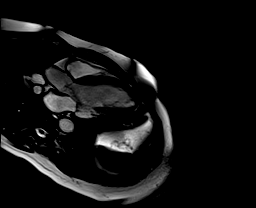

[Series 35: bSSFP · oblique · 6.0mm · 1.41mm/px · 1 of 25 slices shown (20 of 22)]
[im 1/25]
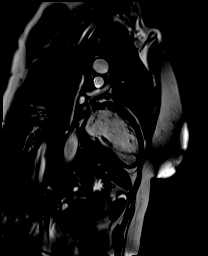

[Series 36: bSSFP · axial · 6.0mm · 1.41mm/px · 1 of 25 slices shown (21 of 22)]
[im 1/25]
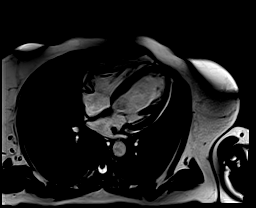

[Series 37: pre short axis · oblique · non-contrast · 8.0mm · 2.25mm/px · 1 of 10 slices shown (1 of 6)]
[im 1/10]
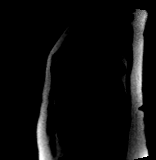

[Series 38: pre short axis · oblique · non-contrast · 8.0mm · 2.25mm/px · 1 of 10 slices shown (2 of 6)]
[im 1/10]
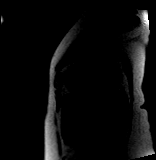

[Series 39: pre short axis · oblique · non-contrast · 8.0mm · 2.25mm/px · 1 of 10 slices shown (3 of 6)]
[im 1/10]
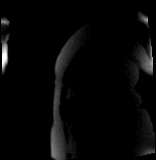

[Series 40: pre short axis · oblique · non-contrast · 8.0mm · 2.25mm/px · 1 of 10 slices shown (4 of 6)]
[im 1/10]
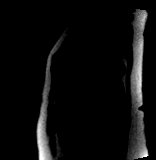

[Series 41: pre short axis · oblique · non-contrast · 8.0mm · 2.25mm/px · 1 of 10 slices shown (5 of 6)]
[im 1/10]
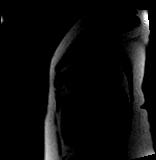

[Series 42: pre short axis · oblique · non-contrast · 8.0mm · 2.25mm/px · 1 of 10 slices shown (6 of 6)]
[im 1/10]
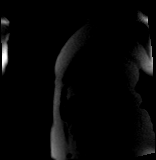

[Series 43: rest perfusion · oblique · 8.0mm · 2.25mm/px · 1 of 60 slices shown (1 of 3)]
[im 1/60]
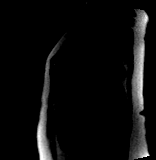

[Series 44: rest perfusion · oblique · 8.0mm · 2.25mm/px · 1 of 60 slices shown (2 of 3)]
[im 1/60]
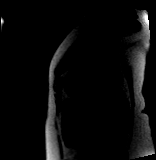

[Series 45: rest perfusion · oblique · 8.0mm · 2.25mm/px · 1 of 60 slices shown (3 of 3)]
[im 1/60]
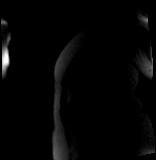

[Series 46: rest perfusion_moco · oblique · 8.0mm · 2.25mm/px · 1 of 60 slices shown (1 of 3)]
[im 1/60]
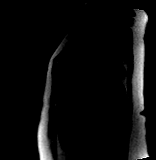

[Series 47: rest perfusion_moco · oblique · 8.0mm · 2.25mm/px · 1 of 60 slices shown (2 of 3)]
[im 1/60]
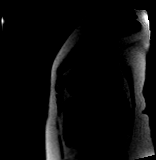

[Series 48: rest perfusion_moco · oblique · 8.0mm · 2.25mm/px · 1 of 60 slices shown (3 of 3)]
[im 1/60]
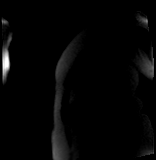

[Series 49: bSSFP · coronal · 6.0mm · 1.41mm/px · 1 of 25 slices shown (22 of 22)]
[im 1/25]
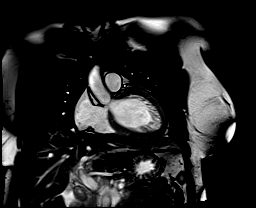

[Series 50: aortic valve cine · oblique · 6.0mm · 1.41mm/px · 1 of 25 slices shown]
[im 1/25]
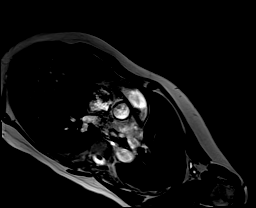

[Series 51: cine rvit · coronal · 6.0mm · 1.41mm/px · 1 of 25 slices shown]
[im 1/25]
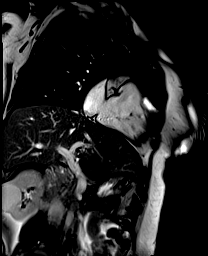

[Series 52: cine rvot · sagittal · 6.0mm · 1.41mm/px · 1 of 25 slices shown]
[im 1/25]
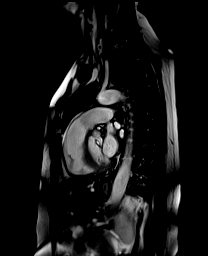

[Series 54: lge_single shot sa · sagittal · 8.0mm · 2.08mm/px · 1 of 16 slices shown (1 of 2)]
[im 1/16]
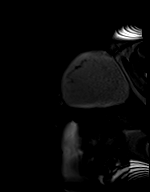

[Series 55: lge_single shot sa · sagittal · 8.0mm · 2.08mm/px · 1 of 16 slices shown (2 of 2)]
[im 1/16]
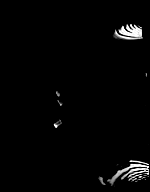

[Series 58: lge_single shot 4 · axial · 6.0mm · 1.98mm/px · 1 of 1 slices shown]
[im 1/1]
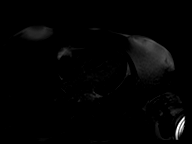

[45 of 48 positions shown; findings below may reference images not displayed]

FINDINGS: Normal atrial sizes. No IRSYANYAHERDA thrombus. No ASD/PFO. Normal TV and MV.
Tri leaflet aortic valve mild appearing central AR . Normal aortic
root 2.8 cm. Trivial lateral pericardial effusion. Mild LVE. No LVH
septal thickness 9 mm Mild diffuse hypokinesis worse in the apex.
Quantitative EF 50% (EDV 116 cc ESV 58 cc SV 58 cc) Delayed
gadolinium images show diffuse mid myocardial uptake worse in the
septum

Global Myocardial ECV 38

Global Myocardial T1 8625 msec

Mid Myocardial T2 range 55-71 msec
IMPRESSION: 1.  Mild LVE with diffuse hypokinesis worse in the apex EF 50%

2. Diffuse mid myocardial gadolinium uptake non-specific can be seen
in non ischemic DCM

3.  Normal RV size and function

4.  Trivial lateral pericardial effusion

5.  Tri leaflet AV with mild appearing AR

6.  Parametric numbers show mildly elevated T2 and ECV

Methlouthi Ksontini

## 2021-05-08 ENCOUNTER — Telehealth (HOSPITAL_COMMUNITY): Payer: Self-pay | Admitting: *Deleted

## 2021-05-08 NOTE — Telephone Encounter (Signed)
Pt left vm concerned about circulation. She said one of her index fingers is numb and has slight discoloration. Pt thinks she has poor blood circulation. No other complaints at this time.  Pt requests office visit or advice.   Routed to [facility] for advice

## 2021-05-08 NOTE — Telephone Encounter (Signed)
She should see her PCP for this.

## 2021-05-08 NOTE — Telephone Encounter (Signed)
Called pts daughter as requested. No answer/vm full.

## 2021-05-12 NOTE — Progress Notes (Addendum)
    SUBJECTIVE:   CHIEF COMPLAINT / HPI:   HFrEF: Describes symptoms Current regimen: Entresto 97/103 mg bid, spironolactone 25 mg daily, carvedilol 6.25 bid. She reports that Dr. Damaris Schooner office requested a repeat echo in July, she has had difficulty getting echo scheduled. Will order echo today. She reports that going to work she has to walk about half a mile from the parking lot to the building and she gets dsypnea and mild chest pain. Recommend handicap parking placard.   HTN: BP is not at goal: 150/70s. See above for medications. She reports symptoms of dizziness that have increased in frequency. She has also had 3 episodes of her left second digit becoming purple, numb, and painful during these dizzy episodes. She will have her blood pressure taken and it is low to 100s/60s. This is not associated with position change. After a few minutes her BP increases and her finger returns to normal color and pain and numbness go away. She does not have any of these symptoms present today.  Healthcare Maintenance: Recommend pap smear, mammogram, COVID-19 3rd booster, Pneumococcal vaccine.  Tobacco use: smokes 6 cigarettes per day. She is interested in trying chantix. Counseled on SE's, patient will notify office if she has any mood changes.   PERTINENT  PMH / PSH: HFrEF, tobacco use, EtOH use, HTN  OBJECTIVE:   BP (!) 158/73   Pulse 88   Wt 118 lb 9.6 oz (53.8 kg)   LMP 04/27/2013 Comment: Perimenapausal   SpO2 98%   BMI 22.41 kg/m   Nursing note and vitals reviewed GEN: age-appropriate, AAW, resting comfortably in chair, NAD, WNWD, alert and at baseline HEENT: NCAT. PERRLA. Sclera without injection or icterus. MMM.  LUE: left first digit with normal sensation, normal color, full ROM, normal strength.  Cardiac: Regular rate and rhythm. Normal S1/S2. No murmurs, rubs, or gallops appreciated. 2+ radial pulses. Lungs: Clear bilaterally to ascultation. No increased WOB, no accessory muscle  usage. No w/r/r. Ext: no edema Psych: Pleasant and appropriate   ASSESSMENT/PLAN:   Congestive heart failure, NYHA class 2 ([place]) Patient reports NYHA class II symptoms today, especially with walking from car to work in heat. Recommend handicap placard, patient's daughter will obtain and drop off form. Patient compliant with medication, see above for current regimen. Due for repeat echo, will order and schedule. Recommend patient follow up with Dr. Jeffie Pollock as directed.  Health care maintenance Patient to schedule appt for pap smear. Mammogram ordered.  Benign essential HTN Above goal. Patient is on Entresto, spironolactone and carvedilol for HFrEF, however she is having significant episodes of hypotension with symptoms including potentially decreased perfusion to left 2nd digit. Can consider decreasing entresto or carvedilol, however, want patient to receive maximum benefit of regimen as well. BP today above goal at 150s/80s. Best course is to obtain ambulatory blood pressure cuff to get 24 hour data to drive medication management. She is scheduled to see Dr. Valentina Lucks on 8/26. Follow up thereafter.  Tobacco use Patient is in action part of phases of change. She wishes to try varenicline. Counseled on quitting, 1-800-QUIT-NOW resource, will have her follow with pharmacy. - varenicline 0.5 mg, #60, 1 tablet QD x7d, increase to BID thereafter     Gladys Damme, MD [place]

## 2021-05-13 ENCOUNTER — Other Ambulatory Visit: Payer: Self-pay

## 2021-05-13 ENCOUNTER — Ambulatory Visit (INDEPENDENT_AMBULATORY_CARE_PROVIDER_SITE_OTHER): Payer: 59 | Admitting: Family Medicine

## 2021-05-13 ENCOUNTER — Encounter: Payer: Self-pay | Admitting: Family Medicine

## 2021-05-13 VITALS — BP 158/73 | HR 88 | Wt 118.6 lb

## 2021-05-13 DIAGNOSIS — Z0000 Encounter for general adult medical examination without abnormal findings: Secondary | ICD-10-CM

## 2021-05-13 DIAGNOSIS — F17200 Nicotine dependence, unspecified, uncomplicated: Secondary | ICD-10-CM

## 2021-05-13 DIAGNOSIS — I5022 Chronic systolic (congestive) heart failure: Secondary | ICD-10-CM

## 2021-05-13 DIAGNOSIS — Z720 Tobacco use: Secondary | ICD-10-CM

## 2021-05-13 DIAGNOSIS — I10 Essential (primary) hypertension: Secondary | ICD-10-CM

## 2021-05-13 MED ORDER — VARENICLINE TARTRATE 0.5 MG PO TABS: 0.5000 mg | ORAL_TABLET | Freq: Two times a day (BID) | ORAL | 1 refills | Status: DC

## 2021-05-13 NOTE — Assessment & Plan Note (Addendum)
Patient reports NYHA class II symptoms today, especially with walking from car to work in heat. Recommend handicap placard, patient's daughter will obtain and drop off form. Patient compliant with medication, see above for current regimen. Due for repeat echo, will order and schedule. Recommend patient follow up with Dr. Jeffie Pollock as directed.

## 2021-05-13 NOTE — Assessment & Plan Note (Signed)
Patient is in action part of phases of change. She wishes to try varenicline. Counseled on quitting, 1-800-QUIT-NOW resource, will have her follow with pharmacy. - varenicline 0.5 mg, #60, 1 tablet QD x7d, increase to BID thereafter

## 2021-05-13 NOTE — Assessment & Plan Note (Signed)
Above goal. Patient is on Entresto, spironolactone and carvedilol for HFrEF, however she is having significant episodes of hypotension with symptoms including potentially decreased perfusion to left 2nd digit. Can consider decreasing entresto or carvedilol, however, want patient to receive maximum benefit of regimen as well. BP today above goal at 150s/80s. Best course is to obtain ambulatory blood pressure cuff to get 24 hour data to drive medication management. She is scheduled to see Dr. Valentina Lucks on 8/26. Follow up thereafter.

## 2021-05-13 NOTE — Assessment & Plan Note (Signed)
Patient to schedule appt for pap smear. Mammogram ordered.

## 2021-05-13 NOTE — Patient Instructions (Addendum)
It was a pleasure to see you today!  Please go to appt with Dr. Valentina Lucks on 8/26 at 8:30 AM for BP monitoring Make an appt for pap smear at your earliest convenience We will help schedule an echo for you Get the handicap form from the [facility] and drop it by the office, I will fill it out Smoking cessation: Start by taking 1 pill of varenicline once a day for 7 days, then increase to twice a day. Once on the twice a day medication, pick a day to quit smoking. Use 1-800-QUIT-NOW as a resource for support and we will follow with you! Make an appointment for your mammogram at the [facility],  Dr. Chauncey Reading

## 2021-05-14 ENCOUNTER — Encounter (HOSPITAL_COMMUNITY): Payer: Self-pay | Admitting: Internal Medicine

## 2021-05-14 ENCOUNTER — Ambulatory Visit (HOSPITAL_COMMUNITY)
Admission: RE | Admit: 2021-05-14 | Discharge: 2021-05-14 | Disposition: A | Discharge status: Home / Self Care | Payer: Self-pay | Source: Ambulatory Visit | Attending: Family Medicine | Admitting: Family Medicine

## 2021-05-14 DIAGNOSIS — I110 Hypertensive heart disease with heart failure: Secondary | ICD-10-CM | POA: Insufficient documentation

## 2021-05-14 DIAGNOSIS — I5022 Chronic systolic (congestive) heart failure: Secondary | ICD-10-CM | POA: Insufficient documentation

## 2021-05-14 DIAGNOSIS — I351 Nonrheumatic aortic (valve) insufficiency: Secondary | ICD-10-CM | POA: Insufficient documentation

## 2021-05-14 DIAGNOSIS — E119 Type 2 diabetes mellitus without complications: Secondary | ICD-10-CM | POA: Insufficient documentation

## 2021-05-14 LAB — ECHOCARDIOGRAM COMPLETE
Area-P 1/2: 3.85 cm2
P 1/2 time: 445 msec
S' Lateral: 2.9 cm

## 2021-05-14 NOTE — Progress Notes (Signed)
  Echocardiogram 2D Echocardiogram has been performed.  Fidel Levy 05/14/2021, 9:13 AM

## 2021-05-15 ENCOUNTER — Other Ambulatory Visit: Payer: Self-pay | Admitting: Family Medicine

## 2021-05-15 DIAGNOSIS — F17200 Nicotine dependence, unspecified, uncomplicated: Secondary | ICD-10-CM

## 2021-05-16 ENCOUNTER — Telehealth: Payer: Self-pay | Admitting: Family Medicine

## 2021-05-16 ENCOUNTER — Ambulatory Visit (INDEPENDENT_AMBULATORY_CARE_PROVIDER_SITE_OTHER): Payer: Self-pay | Admitting: Pharmacist

## 2021-05-16 ENCOUNTER — Other Ambulatory Visit: Payer: Self-pay

## 2021-05-16 ENCOUNTER — Encounter: Payer: Self-pay | Admitting: Pharmacist

## 2021-05-16 DIAGNOSIS — Z720 Tobacco use: Secondary | ICD-10-CM

## 2021-05-16 NOTE — Patient Instructions (Addendum)
Cut down from 6 to 4 cigarettes per day with use of nicotine lozenges.    Follow-up for Amb BP monitor - when the monitor is next available.

## 2021-05-16 NOTE — Telephone Encounter (Signed)
Patient dropped off form for handicap tag. Last DOS: 05/13/2021. Patient would like to pick up form once completed. [phone]. Placing forms in the blue team folder. Thanks!

## 2021-05-16 NOTE — Progress Notes (Signed)
   S:   Patient arrives in good spirits accompanied by her daughter.  Presents to the clinic for ambulatory blood pressure evaluation however the monitor was missing a part and the visit was adjusted to a focus on tobacco intake reduction. Patient was referred and last seen by Primary Care Provider, [facility] on 05/13/2021.   Started smoking at 60 YO. No history of quitting as an adult.  Social smoker in school, addictive smoker at 12. 0.5 ppd, [place] [employer] menthol shorts, now 6 cigs/day. Last smoked 1/2 ppd right before finding out she had HF within the last year.   1-10 importance: 8 1-10 confidence: 6   Patches, lozenges, gum.  Doesn't like 'bite' of gum or lozenges; only tried mint      Medication compliance is reported to be excellent for blood pressure medications.  Patient was unable to afford the varenicline recently prescribed.    O:  Physical Exam Constitutional:      Appearance: Normal appearance. She is normal weight.  Pulmonary:     Effort: Pulmonary effort is normal.  Neurological:     Mental Status: She is alert.  Psychiatric:        Mood and Affect: Mood normal.        Thought Content: Thought content normal.    Review of Systems  All other systems reviewed and are negative.  Last 3 Office BP readings: BP Readings from Last 3 Encounters:  05/13/21 (!) 158/73  10/30/20 124/70  07/30/20 [id number]    Basic Metabolic Panel    Component Value Date/Time   NA 139 10/30/2020 1157   NA 144 12/25/2019 1336   K 3.6 10/30/2020 1157   CL 107 10/30/2020 1157   CO2 21 (L) 10/30/2020 1157   GLUCOSE 91 10/30/2020 1157   BUN 10 10/30/2020 1157   BUN 18 12/25/2019 1336   CREATININE 0.53 10/30/2020 1157   CREATININE 0.60 01/11/2013 1042   CALCIUM 9.0 10/30/2020 1157   GFRNONAA >60 10/30/2020 1157   GFRAA >60 05/28/2020 1405     A/P: Long History of tobacco abuse.  Currently contemplative about quitting.  Patient willing to try nicotine lozenges to help with  reducing intake of cigarettes.  Goal was to reduce smoking by ~ 2 cigarettes per day over the next few weeks. She intends to use approximately 2 pieces of gum daily.  Patient verbalized goal of quitting completely by the end of next month.   Amb BP monitor - plan to call patient and reschedule then monitor is available for use.    Results reviewed and written information provided.  Total time in face-to-face counseling 27 minutes.   F/U Clinic Visit for Amb BP evaluation pending soon.  Patient seen with Meyer Russel, PharmD Candidate, and Joseph Art, PharmD - PGY-1 Resident.

## 2021-05-16 NOTE — Assessment & Plan Note (Signed)
Long History of tobacco abuse.  Currently contemplative about quitting.  Patient willing to try nicotine lozenges to help with reducing intake of cigarettes.  Goal was to reduce smoking by ~ 2 cigarettes per day over the next few weeks. She intends to use approximately 2 pieces of gum daily.  Patient verbalized goal of quitting completely by the end of next month.

## 2021-05-16 NOTE — Telephone Encounter (Signed)
Completed form, placed at front desk for pick up.  Gladys Damme, MD [facility], PGY-3

## 2021-05-19 NOTE — Telephone Encounter (Signed)
Patient aware. Angela Serrano,CMA  

## 2021-05-19 NOTE — Progress Notes (Signed)
Reviewed: I agree with Dr. Koval's documentation and management. 

## 2021-05-22 ENCOUNTER — Telehealth: Payer: Self-pay | Admitting: Student-PharmD

## 2021-05-22 ENCOUNTER — Telehealth: Payer: Self-pay | Admitting: Family Medicine

## 2021-05-22 NOTE — Telephone Encounter (Signed)
Called patient to reschedule appt for ambulatory BP monitor with Dr. Valentina Lucks. Security answered the phone who stated she lost her phone at work today and were trying to find the owner. Will try her again another day once she has a chance to claim her phone.

## 2021-05-23 NOTE — Telephone Encounter (Signed)
Discussed echo results with patient, who was appreciative. Improved EF.  Gladys Damme, MD [facility], PGY-3

## 2021-06-04 ENCOUNTER — Ambulatory Visit: Payer: Self-pay | Admitting: Pharmacist

## 2021-06-05 ENCOUNTER — Ambulatory Visit: Payer: Self-pay | Admitting: Pharmacist

## 2021-06-05 ENCOUNTER — Other Ambulatory Visit: Payer: Self-pay

## 2021-06-05 ENCOUNTER — Ambulatory Visit (INDEPENDENT_AMBULATORY_CARE_PROVIDER_SITE_OTHER): Payer: Self-pay | Admitting: Pharmacist

## 2021-06-05 DIAGNOSIS — I10 Essential (primary) hypertension: Secondary | ICD-10-CM

## 2021-06-05 NOTE — Progress Notes (Signed)
   S:    Patient arrives in good spirits.  Presents to the clinic for ambulatory blood pressure evaluation.   Patient was referred and last seen by Primary Care Provider, [facility] on 05/13/2021.   Diagnosed with Hypertension in the year of 2014.    Medication compliance is reported to be optimal.  Discussed procedure for wearing the monitor and gave patient written instructions. Monitor was placed on non-dominant arm with instructions to return in the morning.   Current BP Medications include:  carvedilol 6.'25mg'$  BID, spironolactone '25mg'$ , Entresto 97-'103mg'$  BID  Antihypertensives tried in the past include: losartan '25mg'$ , lisinopril '5mg'$ , clonidine 0.2 mg  Dietary habits include: Patient reports a low salt diet but does eat take out 2x/week  Patient does not have insurance and therefore was unable to pick up Chantix. She still has quit date of 9/30. Patient has been using lozenges and reports they have been helping. She has cut back by 1-2 cigarettes  O:  Physical Exam Neurological:     Mental Status: She is alert and oriented to person, place, and time.    Review of Systems  Cardiovascular:  Negative for chest pain, orthopnea and leg swelling.   Last 3 Office BP readings: BP Readings from Last 3 Encounters:  05/13/21 (!) 158/73  10/30/20 124/70  07/30/20 [id number]    Basic Metabolic Panel    Component Value Date/Time   NA 139 10/30/2020 1157   NA 144 12/25/2019 1336   K 3.6 10/30/2020 1157   CL 107 10/30/2020 1157   CO2 21 (L) 10/30/2020 1157   GLUCOSE 91 10/30/2020 1157   BUN 10 10/30/2020 1157   BUN 18 12/25/2019 1336   CREATININE 0.53 10/30/2020 1157   CREATININE 0.60 01/11/2013 1042   CALCIUM 9.0 10/30/2020 1157   GFRNONAA >60 10/30/2020 1157   GFRAA >60 05/28/2020 1405    Renal function: CrCl cannot be calculated (Patient's most recent lab result is older than the maximum 21 days allowed.).  Today's Office Blood Pressure (BP) reading: 156/78 mmHg (manual  reading)  A/P: History of hypertension longstanding. Medication adherence appears optimal. Placed 24-hour ambulatory blood pressure monitor on patient's left arm. Patient to return tomorrow.  Tobacco use: Patient to follow-up in clinic tomorrow   Results reviewed and written information provided.  Total time in face-to-face counseling 15 minutes.   F/U Clinic Visit tomorrow. Patient seen with Meyer Russel, PharmD Candidate

## 2021-06-05 NOTE — Patient Instructions (Signed)
Blood Pressure Activity Diary Time Lying down/ Sleeping Walking/ Exercise Stressed/ Angry Headache/ Pain Dizzy  9 AM            10 AM            11 AM            12 PM            1 PM            2 PM            Time Lying down/ Sleeping Walking/ Exercise Stressed/ Angry Headache/ Pain Dizzy  3 PM            4 PM             5 PM            6 PM            7 PM            8 PM            Time Lying down/ Sleeping Walking/ Exercise Stressed/ Angry Headache/ Pain Dizzy  9 PM            10 PM            11 PM            12 AM            1 AM            2 AM            3 AM            Time Lying down/ Sleeping Walking/ Exercise Stressed/ Angry Headache/ Pain Dizzy  4 AM            5 AM            6 AM            7 AM            8 AM            9 AM            10 AM              Time you woke up: _________                  Time you went to sleep:__________   Come back tomorrow at 8:30am to have the monitor removed Call the [facility] if you have any questions before then ([phone])   Wearing the Blood Pressure Monitor The cuff will inflate every 20 minutes during the day and every 30 minutes while you sleep. Your blood pressure readings will NOT display after cuff inflation Fill out the blood pressure-activity diary during the day, especially during activities that may affect your reading -- such as exercise, stress, walking, taking your blood pressure medications   Important things to know: Avoid taking the monitor off for the next 24 hours, unless it causes you discomfort or pain. Do NOT get the monitor wet and do NOT dry to clean the monitor with any cleaning products. Do NOT put the monitor on anyone else's arm. When the cuff inflates, avoid excess movement. Let the cuffed arm hang loosely, slightly away from the body. Avoid flexing the muscles or moving the hand/fingers. When you go to sleep, make sure that the hose is not kinked. Remember to fill out  the blood pressure activity diary. If you experience severe pain or unusual pain (not associated with getting your blood pressure checked), remove the monitor.   Troubleshooting:  Code  Troubleshooting   1  Check cuff position, tighten cuff   2, 3  Remain still during reading   4, 87  Check air hose connections and make sure cuff is tight   85, 89  Check hose connections and make tubing is not crimped   86  Push START/STOP to restart reading   88, 91  Retry by pushing START/STOP   90  Replace batteries. If problem persists, remove monitor and bring back to   clinic at follow up   97, 98, 99  Service required - Remove monitor and bring back to clinic at follow up

## 2021-06-06 ENCOUNTER — Ambulatory Visit (INDEPENDENT_AMBULATORY_CARE_PROVIDER_SITE_OTHER): Payer: Self-pay | Admitting: Pharmacist

## 2021-06-06 ENCOUNTER — Other Ambulatory Visit: Payer: Self-pay

## 2021-06-06 DIAGNOSIS — Z720 Tobacco use: Secondary | ICD-10-CM

## 2021-06-06 DIAGNOSIS — I10 Essential (primary) hypertension: Secondary | ICD-10-CM

## 2021-06-06 NOTE — Assessment & Plan Note (Signed)
History of hypertension since 2014. 24-hour ambulatory blood pressure demonstrates good control on current medication regimen, with an average blood pressure of 122/71 mmHg, awake daytime average of 131/78 and a nocturnal dipping pattern that is normal. No changes to medications today.

## 2021-06-06 NOTE — Progress Notes (Signed)
   S:    Patient arrives in good spirits, returns without complaint. Presents to the clinic for ambulatory blood pressure evaluation Day 2.  Medication compliance is reported to be optimal.  Discussed procedure for wearing the monitor and gave patient written instructions. Monitor was placed on non-dominant arm with instructions to return in the morning.   Current BP Medications include:  carvedilol 6.25 mg BID, Entresto (sacubitril-valsartan) 97-103 mg BID, spironolactone 25 mg daily.  O:  Physical Exam Vitals reviewed.  Constitutional:      Appearance: Normal appearance.  Neurological:     Mental Status: She is alert.  Psychiatric:        Mood and Affect: Mood normal.        Behavior: Behavior normal.   ROS  Last 3 Office BP readings: BP Readings from Last 3 Encounters:  05/13/21 (!) 158/73  10/30/20 124/70  07/30/20 [id number]   Basic Metabolic Panel    Component Value Date/Time   NA 139 10/30/2020 1157   NA 144 12/25/2019 1336   K 3.6 10/30/2020 1157   CL 107 10/30/2020 1157   CO2 21 (L) 10/30/2020 1157   GLUCOSE 91 10/30/2020 1157   BUN 10 10/30/2020 1157   BUN 18 12/25/2019 1336   CREATININE 0.53 10/30/2020 1157   CREATININE 0.60 01/11/2013 1042   CALCIUM 9.0 10/30/2020 1157   GFRNONAA >60 10/30/2020 1157   GFRAA >60 05/28/2020 1405   ABPM Study Data: Arm Placement left arm  Overall Mean 24hr BP:   122/71 mmHg HR: 84  Daytime Mean BP:  131/78 mmHg HR: 88  Nighttime Mean BP:  94/51 mmHg HR: 73  Dipping Pattern: Yes.    Sys:   28.0%   Dia: 34.2%   [normal dipping ~10-20%]  Non-hypertensive ABPM thresholds: daytime BP <125/75 mmHg, sleeptime BP <120/70 mmHg   PHQ-9 Score: 1  A/P: History of hypertension since 2014. 24-hour ambulatory blood pressure demonstrates good control on current medication regimen, with an average blood pressure of 122/71 mmHg, awake daytime average of 131/78 and a nocturnal dipping pattern that is normal. No changes to medications  today.  Chronic Tobacco Abuse - currently smoking 6-8  cigarettes.  Currently has quit date planned for 06/20/2021.  She has nicotine lozenges for her PRN use.  She is supported by her sister who is also trying to quit AND her daughter.  Patient plans to follow-up with Dr. Chauncey Reading in early October. I offered to meet/help patient if current quit plan was unsuccessful.    Results reviewed and written information provided.  Total time in face-to-face counseling 30 minutes.   F/U Clinic Visit with Dr. Chauncey Reading.  Patient seen with Meyer Russel, PharmD Candidate, and Juluis Pitch, PharmD - PGY-1 Resident.

## 2021-06-06 NOTE — Assessment & Plan Note (Signed)
Chronic Tobacco Abuse - currently smoking 6-8  cigarettes.  Currently has quit date planned for 06/20/2021.  She has nicotine lozenges for her PRN use.  She is supported by her sister who is also trying to quit AND her daughter.  Patient plans to follow-up with Dr. Chauncey Reading in early October. I offered to meet/help patient if current quit plan was unsuccessful.

## 2021-06-06 NOTE — Patient Instructions (Signed)
Ambulatory Blood Pressure Test showed good control.   No change in medications today.   Follow-up with Dr. Chauncey Reading

## 2021-06-06 NOTE — Progress Notes (Signed)
Reviewed: I agree with Dr. Koval's documentation and management. 

## 2021-06-11 ENCOUNTER — Other Ambulatory Visit: Payer: Self-pay | Admitting: Family Medicine

## 2021-06-11 DIAGNOSIS — Z1231 Encounter for screening mammogram for malignant neoplasm of breast: Secondary | ICD-10-CM

## 2021-06-25 ENCOUNTER — Other Ambulatory Visit: Payer: Self-pay

## 2021-06-25 ENCOUNTER — Ambulatory Visit (INDEPENDENT_AMBULATORY_CARE_PROVIDER_SITE_OTHER): Payer: Self-pay | Admitting: Family Medicine

## 2021-06-25 ENCOUNTER — Other Ambulatory Visit (HOSPITAL_COMMUNITY)
Admission: RE | Admit: 2021-06-25 | Discharge: 2021-06-25 | Disposition: A | Discharge status: Home / Self Care | Payer: Self-pay | Source: Ambulatory Visit | Attending: Family Medicine | Admitting: Family Medicine

## 2021-06-25 VITALS — BP 121/67 | HR 69 | Ht 61.0 in | Wt 117.2 lb

## 2021-06-25 DIAGNOSIS — I10 Essential (primary) hypertension: Secondary | ICD-10-CM

## 2021-06-25 DIAGNOSIS — Z124 Encounter for screening for malignant neoplasm of cervix: Secondary | ICD-10-CM

## 2021-06-25 DIAGNOSIS — Z720 Tobacco use: Secondary | ICD-10-CM

## 2021-06-25 DIAGNOSIS — I5022 Chronic systolic (congestive) heart failure: Secondary | ICD-10-CM

## 2021-06-25 DIAGNOSIS — Z0000 Encounter for general adult medical examination without abnormal findings: Secondary | ICD-10-CM

## 2021-06-25 DIAGNOSIS — Z1231 Encounter for screening mammogram for malignant neoplasm of breast: Secondary | ICD-10-CM

## 2021-06-25 NOTE — Assessment & Plan Note (Signed)
Reminded patient about 1 800 quit NOW resource, please see pharmacy note.

## 2021-06-25 NOTE — Assessment & Plan Note (Signed)
Pap completed today.  Mammogram ordered, reminded patient to schedule appointment.  Counseled on vaccines as above.

## 2021-06-25 NOTE — Progress Notes (Signed)
    SUBJECTIVE:   CHIEF COMPLAINT / HPI: f/u chronic problems  HTN: Patient had 24 hour ambulatory cuff done recently which showed overall 24 avg at goal of 122/71, daytime avg of 131/78, and night time avg 94/51. Current regimen includes: carvedilol 6.25 mg BID, Entresto (sacubitril-valsartan) 97-103 mg BID, spironolactone 25 mg daily. Patient reports no dizziness, no falls, no vision changes, headaches. BP today is at goal: 121/67.   Tobacco use: patient had quit date of 06/20/21. She has kept to this goal and reports smoking 1 to 2 cigarettes daily since then. She has been using nicotine lozenges.  Pharmacy team to come in and discuss with patient.  HFpEF: FMLA paperwork signed for daughter to attend mother's appointments  [facility] maintenance: pap smear performed today.  Patient reports that she has not had intercourse in greater than 6 years, declines routine STI testing.  Patient has been menopausal for approximately 5 years. Last pap NILM in 2016, no history of abnormal pap smears. Counseled on flu, COVID, and shingles vaccines. Reminded her to schedule mammogram.  PERTINENT  PMH / PSH: HFpEF, tobacco smoking, high blood pressure  OBJECTIVE:   BP 121/67   Pulse 69   Ht 5\' 1"  (1.549 m)   Wt 117 lb 3.2 oz (53.2 kg)   LMP 04/27/2013 Comment: Perimenapausal   SpO2 100%   BMI 22.14 kg/m   Nursing note and vitals reviewed GEN: Age-appropriate, AAW, resting comfortably in chair, NAD, WNWD Cardiac: Regular rate and rhythm. Normal S1/S2. No murmurs, rubs, or gallops appreciated. 2+ radial pulses. Lungs: Clear bilaterally to ascultation. No increased WOB, no accessory muscle usage. No w/r/r. PELVIC:  Normal appearing external female genitalia, normal vaginal epithelium, no abnormal discharge. Normal appearing cervix.  Neuro: AOx3  Ext: no edema Psych: Pleasant and appropriate   ASSESSMENT/PLAN:   Benign essential HTN Blood pressure is appropriate on 24-hour cuff, also appropriate in  office today.  Chronic, well-controlled, no change in regimen.  Tobacco use Reminded patient about 1 800 quit NOW resource, please see pharmacy note.  Congestive heart failure, NYHA class 2 ([place]) Completed FMLA paperwork for her daughter  Health care maintenance Pap completed today.  Mammogram ordered, reminded patient to schedule appointment.  Counseled on vaccines as above.     Gladys Damme, MD [place]

## 2021-06-25 NOTE — Patient Instructions (Addendum)
It was a pleasure to see you today!  We will get some labs today.  If they are abnormal or we need to do something about them, I will call you.  If they are normal, I will send you a message on MyChart (if it is active) or a letter in the mail.  If you don't hear from [place] in 2 weeks, please call the office  [phone]. Please schedule a mammogram at your convenience by calling the Breast Center at [phone] Keep up the good work with smoking cessation. You can get free supplies and support from 1-800-QUIT-NOW 4. Follow up with me in February of 2023   Be Well,  Dr. Chauncey Reading

## 2021-06-25 NOTE — Assessment & Plan Note (Signed)
Blood pressure is appropriate on 24-hour cuff, also appropriate in office today.  Chronic, well-controlled, no change in regimen.

## 2021-06-25 NOTE — Assessment & Plan Note (Signed)
Completed FMLA paperwork for her daughter

## 2021-06-25 NOTE — Progress Notes (Signed)
Asked by Dr. Chauncey Reading to meet with patient RE tobacco cessation.   Discussed goals of quitting, use of lozenges and quit date.   Following discussion patient agreed to phone follow-up in 10-14 days.   Patient seen with Elyse Jarvis, PharmD Candidate.

## 2021-06-27 LAB — CYTOLOGY - PAP
Comment: NEGATIVE
High risk HPV: NEGATIVE

## 2021-07-07 ENCOUNTER — Telehealth: Payer: Self-pay | Admitting: Pharmacist

## 2021-07-07 NOTE — Telephone Encounter (Signed)
Patient contacted for follow/up of tobacco intake reduction / tobacco cessation attempt.   Since last contact patient reports continued smoking 2 cigarettes per day.    Medications currently being used; Nicotine lozenges    Patient denies any significant side effects from tobacco cessation therapy.   Most common triggers to use tobacco include; First AM and in the evening about 1 hour after dinner while watching TV   Total time with patient call and documentation of interaction: 14 minutes.  F/U Phone call planned: 2 weeks.

## 2021-07-11 ENCOUNTER — Encounter: Payer: Self-pay | Admitting: Family Medicine

## 2021-07-11 DIAGNOSIS — R87612 Low grade squamous intraepithelial lesion on cytologic smear of cervix (LGSIL): Secondary | ICD-10-CM | POA: Insufficient documentation

## 2021-07-14 ENCOUNTER — Other Ambulatory Visit: Payer: Self-pay | Admitting: Family Medicine

## 2021-07-14 DIAGNOSIS — Z1231 Encounter for screening mammogram for malignant neoplasm of breast: Secondary | ICD-10-CM

## 2021-07-17 ENCOUNTER — Telehealth: Payer: Self-pay | Admitting: Family Medicine

## 2021-07-17 ENCOUNTER — Encounter: Payer: Self-pay | Admitting: Family Medicine

## 2021-07-17 NOTE — Telephone Encounter (Signed)
Patient did not view MyChart results. Called to discuss, did not answer, left HIPAA compliant VM. If patient calls back, she had a mildly abnormal pap test with low grade abnormality. This is not something to worry about, all we need to do is repeat the pap smear in 1 year. Will send letter as well, just in case.  Gladys Damme, MD [place] [facility], PGY-3

## 2021-07-24 ENCOUNTER — Telehealth: Payer: Self-pay | Admitting: Pharmacist

## 2021-07-24 NOTE — Telephone Encounter (Signed)
Attempted to contact patient for follow-up of tobacco intake reduction / cessation X 2 today   On second attempted call, left HIPAA compliant voice mail requesting call back to direct phone: [phone]    Total time with patient call and documentation of interaction: 7 minutes.  Additional F/U Phone call planned:  1 week

## 2021-07-24 NOTE — Telephone Encounter (Signed)
-----   Message from Leavy Cella, [place] sent at 07/07/2021  2:12 PM EDT ----- Regarding: Tobacco Cessation vs. Schedule QUIT date (down to 2 per day).

## 2021-07-30 ENCOUNTER — Telehealth: Payer: Self-pay | Admitting: Pharmacist

## 2021-07-30 NOTE — Telephone Encounter (Signed)
-----   Message from Leavy Cella, [place] sent at 07/24/2021  2:04 PM EDT ----- Regarding: Tobacco Intake reduction / Cessation Unable to contact on 11/3  X2

## 2021-07-30 NOTE — Telephone Encounter (Signed)
Attempted to contact patient for follow-up of tobacco intake reduction / cessation.    Left HIPAA compliant voice mail.  Total time with patient call and documentation of interaction: 7 minutes.  Additional F/U Phone call planned: within 7 days.

## 2021-08-09 ENCOUNTER — Other Ambulatory Visit (HOSPITAL_COMMUNITY): Payer: Self-pay | Admitting: Cardiology

## 2021-08-18 ENCOUNTER — Telehealth: Payer: Self-pay | Admitting: Pharmacist

## 2021-08-18 NOTE — Telephone Encounter (Signed)
Attempted to contact patient for follow-up of tobacco intake reduction / cessation.    Left HIPAA compliant voice mail requesting call back to direct phone: [phone]  As I have attempted multiple times. I requested patient to call back or reschedule for further assistance.    Total time with patient call and documentation of interaction: 8 minutes.  Additional F/U Phone call planned: None at this time.  Only if requested by patient.  Patient does not have any visit scheduled with PCP.  If patient  is interested or if I can be of further assistance, please let me know.

## 2021-08-18 NOTE — Telephone Encounter (Signed)
-----   Message from Leavy Cella, [place] sent at 07/30/2021 12:18 PM EST ----- Regarding: No answer on two different days.  - retry 1 additional time.

## 2021-10-27 ENCOUNTER — Other Ambulatory Visit (HOSPITAL_COMMUNITY): Payer: Self-pay | Admitting: Internal Medicine

## 2021-11-19 ENCOUNTER — Other Ambulatory Visit (HOSPITAL_COMMUNITY): Payer: Self-pay | Admitting: Internal Medicine

## 2021-12-08 ENCOUNTER — Other Ambulatory Visit (HOSPITAL_COMMUNITY): Payer: Self-pay | Admitting: Internal Medicine

## 2021-12-29 ENCOUNTER — Ambulatory Visit (INDEPENDENT_AMBULATORY_CARE_PROVIDER_SITE_OTHER): Payer: 59 | Admitting: Family Medicine

## 2021-12-29 ENCOUNTER — Other Ambulatory Visit (HOSPITAL_COMMUNITY): Payer: Self-pay

## 2021-12-29 ENCOUNTER — Encounter: Payer: Self-pay | Admitting: Family Medicine

## 2021-12-29 VITALS — BP 134/74 | HR 74 | Ht 61.0 in | Wt 125.4 lb

## 2021-12-29 DIAGNOSIS — F17200 Nicotine dependence, unspecified, uncomplicated: Secondary | ICD-10-CM

## 2021-12-29 DIAGNOSIS — I5022 Chronic systolic (congestive) heart failure: Secondary | ICD-10-CM

## 2021-12-29 DIAGNOSIS — E782 Mixed hyperlipidemia: Secondary | ICD-10-CM

## 2021-12-29 DIAGNOSIS — R079 Chest pain, unspecified: Secondary | ICD-10-CM

## 2021-12-29 DIAGNOSIS — Z0000 Encounter for general adult medical examination without abnormal findings: Secondary | ICD-10-CM

## 2021-12-29 DIAGNOSIS — I10 Essential (primary) hypertension: Secondary | ICD-10-CM

## 2021-12-29 MED ORDER — VARENICLINE TARTRATE 0.5 MG PO TABS: ORAL_TABLET | ORAL | 1 refills | Status: DC

## 2021-12-29 MED ORDER — NICOTINE 14 MG/24HR TD PT24
14.0000 mg | MEDICATED_PATCH | Freq: Every morning | TRANSDERMAL | 1 refills | Status: DC
  Filled 2021-12-29: qty 14, 14d supply, fill #0

## 2021-12-29 NOTE — Patient Instructions (Addendum)
It was a pleasure to see you today! ? ?We will get some labs today.  If they are abnormal or we need to do something about them, I will call you.  If they are normal, I will send you a message on MyChart (if it is active) or a letter in the mail.  If you don't hear from [place] in 2 weeks, please call the office  [phone]. ?For your smoking cessation: start taking varenicline 0.5 mg once a day for 1-4 days, then increase to twice a day thereafter. Pick a day to stop smoking and then stop. You can also use nicotine patches which have been sent to [facility] outpatient pharmacy. I recommend calling 1-800-Quit-Now for support and free supplies. Follow up by phone in 2 weeks ?Please make a follow up with Dr. Jeffie Pollock and your earliest convenience ?For your point tenderness chest pain: you can try heat, ice, tylenol, ibuprofen, or topical creams.  ?Schedule your mammogram with [facility] breast center at your earliest convenience by calling [phone] ?Your next pap smear is due in October 2023 ? ? ?Be Well, ? ?Dr. Chauncey Reading ? ?

## 2021-12-29 NOTE — Assessment & Plan Note (Signed)
61 yo woman with HFpEF presents with atypical chest pain. EKG reassuringly normal, no signs of ischemia. Will check echo given HFpEF hx, though no HF symptoms and appears to be overall well controlled on current medications. Given history and exam with + tenderness to palpation, current dx is musculoligamentous source of chest pain such as costochondritis, recommend conservative tx. However, due to risk of ischemic disease, will have patient follow up with cardiology as she may need stress test/further evaluation.  ?

## 2021-12-29 NOTE — Assessment & Plan Note (Signed)
Controlled, continue current regimen

## 2021-12-29 NOTE — Assessment & Plan Note (Signed)
Appears stable and well controlled. Will repeat echo since >1 year since last echo and new chest pain sx, see below. Recommend patient continue current medication regimen and follow up with cardiology. ?

## 2021-12-29 NOTE — Progress Notes (Signed)
? ? ?SUBJECTIVE:  ? ?CHIEF COMPLAINT / HPI: chest pain ? ?HFpEF: Current medications are: carvedilol 6.25 mg BID, Entresto (sacubitril-valsartan) 97-103 mg BID, spironolactone 25 mg daily. Patient reports no dyspnea, no leg swelling symptoms. She has gained about 10 lbs, but this in absence of other symptoms and she had to go up a dress size, so she believes weight change is not related to CHF. Last echo 8/22 showed interval improvement from EF 35-40% to 55-60%. She follows with Dr. Jeffie Pollock, but has not been seen in over a year. ? ?Chest pain: Patient reports that for the last two weeks she has had episodic, intermittent bouts of left-sided chest pain that is sharp in nature and occurs at rest. No radiation, no dyspnea, no n/v. She reports an area just to the left of her sternum at about rib 3 that is painful to palpation. Will obtain EKG today. ? ?HTN: BP today is at goal: 134/74. Current regimen is: carvedilol 6.25 mg BID, Entresto (sacubitril-valsartan) 97-103 mg BID, spironolactone 25 mg daily. She reports no symptoms, her BP tends to be lower at home according to patient. Will check metabolic panel today. ? ?Smoking: At last visit in October, patient had cut down to 1-2 cigarettes per day with nicotine lozenges. Today she reports that she smokes 4-6 cigarettes per day. Found that lozenges with her dentures was frustrating. Willing to re-start medications today, wants to try varenicline and patches.  ? ?HLD: Last lipid panel 12/2019: total cholesterol 202, LDL 82, HDL 109. She is not on a statin at this time. Will assess for HLD and ASCVD risk. ? ?Pap results: Pap smear results in 10/22 showed LGSIL with neg HPV. Reminded patient to repeat pap in October of 2023. ? ?HC maintenance: counseled patient to schedule her mammogram, already ordered.  ? ?PERTINENT  PMH / PSH: hypertension, HFpEF, cigarette smoking ? ?OBJECTIVE:  ? ?BP 134/74   Pulse 74   Ht '5\' 1"'$  (1.549 m)   Wt 125 lb 6.4 oz (56.9 kg)   LMP  04/27/2013 Comment: Perimenapausal   SpO2 100%   BMI 23.69 kg/m?   ?Nursing note and vitals reviewed ?GEN: age-appropriate, AAW, resting comfortably in chair, NAD, WNWD ?HEENT: NCAT. PERRLA. Sclera without injection or icterus. MMM.  ?Neck: Supple. No LAD. ?Cardiac: Regular rate and rhythm. Normal S1/S2. No murmurs, rubs, or gallops appreciated. 2+ radial pulses. ?Lungs: Clear bilaterally to ascultation. No increased WOB, no accessory muscle usage. No w/r/r. ?Neuro: AOx3  ?Ext: no edema ?Psych: Pleasant and appropriate  ? ?ASSESSMENT/PLAN:  ? ?Chest pain ?61 yo woman with HFpEF presents with atypical chest pain. EKG reassuringly normal, no signs of ischemia. Will check echo given HFpEF hx, though no HF symptoms and appears to be overall well controlled on current medications. Given history and exam with + tenderness to palpation, current dx is musculoligamentous source of chest pain such as costochondritis, recommend conservative tx. However, due to risk of ischemic disease, will have patient follow up with cardiology as she may need stress test/further evaluation.  ? ?Benign essential HTN ?Controlled, continue current regimen. ? ?Congestive heart failure, NYHA class 2 ([place]) ?Appears stable and well controlled. Will repeat echo since >1 year since last echo and new chest pain sx, see below. Recommend patient continue current medication regimen and follow up with cardiology. ? ?Hyperlipidemia ?Patient had discontinued statin at some point, previously on atorvastatin 20 mg. Last ASCVD risk was intermediate, 8.7%. Will recheck CMP and lipid panel today, will recommend statin for  primary prevention. ? ?Health care maintenance ?Reminded to schedule mammogram, previously ordered. ?  ? ? ?Gladys Damme, MD ?[place]  ? ?

## 2021-12-29 NOTE — Assessment & Plan Note (Signed)
Reminded to schedule mammogram, previously ordered. ?

## 2021-12-29 NOTE — Assessment & Plan Note (Signed)
Patient had discontinued statin at some point, previously on atorvastatin 20 mg. Last ASCVD risk was intermediate, 8.7%. Will recheck CMP and lipid panel today, will recommend statin for primary prevention. ?

## 2021-12-30 LAB — COMPREHENSIVE METABOLIC PANEL
ALT: 12 IU/L (ref 0–32)
AST: 16 IU/L (ref 0–40)
Albumin/Globulin Ratio: 2 (ref 1.2–2.2)
Albumin: 4.4 g/dL (ref 3.8–4.9)
Alkaline Phosphatase: 83 IU/L (ref 44–121)
BUN/Creatinine Ratio: 17 (ref 9–23)
BUN: 12 mg/dL (ref 6–24)
Bilirubin Total: <0.2 mg/dL (ref 0.0–1.2)
CO2: 24 mmol/L (ref 20–29)
Calcium: 9.5 mg/dL (ref 8.7–10.2)
Chloride: 107 mmol/L — ABNORMAL HIGH (ref 96–106)
Creatinine, Ser: 0.71 mg/dL (ref 0.57–1.00)
Globulin, Total: 2.2 g/dL (ref 1.5–4.5)
Glucose: 93 mg/dL (ref 70–99)
Potassium: 4.8 mmol/L (ref 3.5–5.2)
Sodium: 142 mmol/L (ref 134–144)
Total Protein: 6.6 g/dL (ref 6.0–8.5)
eGFR: 98 mL/min/{1.73_m2} (ref >59)

## 2021-12-30 LAB — LIPID PANEL
Chol/HDL Ratio: 1.9 ratio (ref 0.0–4.4)
Cholesterol, Total: 220 mg/dL — ABNORMAL HIGH (ref 100–199)
HDL: 116 mg/dL (ref >39)
LDL Chol Calc (NIH): 96 mg/dL (ref 0–99)
Triglycerides: 46 mg/dL (ref 0–149)
VLDL Cholesterol Cal: 8 mg/dL (ref 5–40)

## 2021-12-31 ENCOUNTER — Other Ambulatory Visit: Payer: Self-pay | Admitting: Family Medicine

## 2021-12-31 DIAGNOSIS — E782 Mixed hyperlipidemia: Secondary | ICD-10-CM

## 2021-12-31 MED ORDER — ATORVASTATIN CALCIUM 10 MG PO TABS: 10.0000 mg | ORAL_TABLET | Freq: Every day | ORAL | 3 refills | Status: DC

## 2021-12-31 NOTE — Progress Notes (Signed)
Ascvd risk 9.6%. Statin prescribed. ? ?Gladys Damme, MD ?[place] [facility], PGY-3 ? ?

## 2022-01-06 ENCOUNTER — Other Ambulatory Visit (HOSPITAL_COMMUNITY): Payer: Self-pay

## 2022-01-09 ENCOUNTER — Ambulatory Visit (HOSPITAL_COMMUNITY)
Admission: RE | Admit: 2022-01-09 | Discharge: 2022-01-09 | Disposition: A | Discharge status: Home / Self Care | Payer: 59 | Source: Ambulatory Visit | Attending: Family Medicine | Admitting: Family Medicine

## 2022-01-09 DIAGNOSIS — E785 Hyperlipidemia, unspecified: Secondary | ICD-10-CM | POA: Diagnosis not present

## 2022-01-09 DIAGNOSIS — I351 Nonrheumatic aortic (valve) insufficiency: Secondary | ICD-10-CM | POA: Diagnosis not present

## 2022-01-09 DIAGNOSIS — R079 Chest pain, unspecified: Secondary | ICD-10-CM | POA: Insufficient documentation

## 2022-01-09 DIAGNOSIS — I5022 Chronic systolic (congestive) heart failure: Secondary | ICD-10-CM | POA: Insufficient documentation

## 2022-01-09 DIAGNOSIS — I110 Hypertensive heart disease with heart failure: Secondary | ICD-10-CM | POA: Diagnosis present

## 2022-01-09 DIAGNOSIS — F17200 Nicotine dependence, unspecified, uncomplicated: Secondary | ICD-10-CM | POA: Diagnosis not present

## 2022-01-09 LAB — ECHOCARDIOGRAM COMPLETE
Area-P 1/2: 7.59 cm2
P 1/2 time: 248 msec
S' Lateral: 3.1 cm

## 2022-01-09 NOTE — Progress Notes (Signed)
?  Echocardiogram ?2D Echocardiogram has been performed. ? Hassie Bruce ?01/09/2022, 8:53 AM ?

## 2022-02-24 ENCOUNTER — Encounter: Payer: Self-pay | Admitting: *Deleted

## 2022-03-03 ENCOUNTER — Other Ambulatory Visit (HOSPITAL_COMMUNITY): Payer: Self-pay | Admitting: Internal Medicine

## 2022-04-05 ENCOUNTER — Other Ambulatory Visit: Payer: Self-pay | Admitting: Family Medicine

## 2022-04-05 DIAGNOSIS — F17200 Nicotine dependence, unspecified, uncomplicated: Secondary | ICD-10-CM

## 2022-04-08 ENCOUNTER — Other Ambulatory Visit: Payer: Self-pay

## 2022-04-08 MED ORDER — SPIRONOLACTONE 25 MG PO TABS
25.0000 mg | ORAL_TABLET | Freq: Every day | ORAL | 0 refills | Status: DC
Start: 2022-04-08 — End: 2022-09-04

## 2022-05-01 ENCOUNTER — Other Ambulatory Visit: Payer: Self-pay | Admitting: Family Medicine

## 2022-05-01 DIAGNOSIS — F17200 Nicotine dependence, unspecified, uncomplicated: Secondary | ICD-10-CM

## 2022-05-21 ENCOUNTER — Encounter: Payer: Self-pay | Admitting: Student

## 2022-05-21 ENCOUNTER — Ambulatory Visit (INDEPENDENT_AMBULATORY_CARE_PROVIDER_SITE_OTHER): Payer: 59 | Admitting: Student

## 2022-05-21 VITALS — BP 155/70 | HR 88 | Ht 61.0 in | Wt 127.4 lb

## 2022-05-21 DIAGNOSIS — E7800 Pure hypercholesterolemia, unspecified: Secondary | ICD-10-CM | POA: Diagnosis not present

## 2022-05-21 DIAGNOSIS — I5022 Chronic systolic (congestive) heart failure: Secondary | ICD-10-CM

## 2022-05-21 DIAGNOSIS — I10 Essential (primary) hypertension: Secondary | ICD-10-CM | POA: Diagnosis not present

## 2022-05-21 NOTE — Patient Instructions (Signed)
It was great to see you! Thank you for allowing me to participate in your care!   I recommend that you always bring your medications to each appointment as this makes it easy to ensure we are on the correct medications and helps [place] not miss when refills are needed.  Our plans for today:  - Keep taking your medications - Follow up with cardiologist about entresto - Follow up in 6 months  We are checking some labs today, I will call you if they are abnormal will send you a MyChart message or a letter if they are normal.  If you do not hear about your labs in the next 2 weeks please let us know.  Take care and seek immediate care sooner if you develop any concerns. Please remember to show up 15 minutes before your scheduled appointment time!  Leslie Dales, DO [facility]

## 2022-05-21 NOTE — Assessment & Plan Note (Signed)
Asymptomatic today.  Well-controlled.  Out of Entresto. - She will follow-up with cardiology about her [facility] program

## 2022-05-21 NOTE — Progress Notes (Signed)
Established Patient Office Visit  Subjective   Patient ID: Angela Serrano, female    DOB: May 12, 1961  Age: 61 y.o. MRN: [id number]  Chief Complaint  Patient presents with   Medication Refill    CHF Congestive heart failure well-controlled with Coreg, Entresto, spironolactone.  However she ran out of Entresto this month.  She states that she received Entresto as part of a program through her cardiologist.  She is asymptomatic today.  HTN Blood pressure was elevated to 155/70 today.  This is likely due to running out of her Entresto.  She is asymptomatic.  HLD She started Lipitor 10 mg 4 months ago because of [place] Vascor of 9.6, has been compliant and without side effects.  Review of Systems  Constitutional:  Negative for chills, fever and weight loss.  HENT:  Negative for congestion and sore throat.   Eyes:  Negative for blurred vision and pain.  Respiratory:  Negative for cough and shortness of breath.   Cardiovascular:  Negative for chest pain, palpitations, orthopnea and leg swelling.  Gastrointestinal:  Negative for abdominal pain, diarrhea, nausea and vomiting.  Musculoskeletal:  Negative for myalgias.  Neurological:  Negative for dizziness, weakness and headaches.      Objective:     BP (!) 155/70   Pulse 88   Ht _0  (1.549 m)   Wt 127 lb 6.4 oz (57.8 kg)   LMP 04/27/2013 Comment: Perimenapausal   SpO2 98%   BMI 24.07 kg/m  BP Readings from Last 3 Encounters:  05/21/22 (!) 155/70  12/29/21 134/74  06/25/21 121/67      Physical Exam Constitutional:      General: She is not in acute distress.    Appearance: Normal appearance. She is not ill-appearing.  HENT:     Head: Normocephalic and atraumatic.     Right Ear: External ear normal.     Left Ear: External ear normal.     Nose: Nose normal.  Cardiovascular:     Rate and Rhythm: Normal rate and regular rhythm.     Heart sounds: No murmur heard.    No friction rub. No gallop.  Pulmonary:     Effort:  Pulmonary effort is normal.     Breath sounds: Normal breath sounds.  Abdominal:     General: There is no distension.  Musculoskeletal:     Right lower leg: No edema.     Left lower leg: No edema.  Neurological:     Mental Status: She is alert.    Last metabolic panel Lab Results  Component Value Date   GLUCOSE 93 12/29/2021   NA 142 12/29/2021   K 4.8 12/29/2021   CL 107 (H) 12/29/2021   CO2 24 12/29/2021   BUN 12 12/29/2021   CREATININE 0.71 12/29/2021   EGFR 98 12/29/2021   CALCIUM 9.5 12/29/2021   PROT 6.6 12/29/2021   ALBUMIN 4.4 12/29/2021   LABGLOB 2.2 12/29/2021   AGRATIO 2.0 12/29/2021   BILITOT <0.2 12/29/2021   ALKPHOS 83 12/29/2021   AST 16 12/29/2021   ALT 12 12/29/2021   ANIONGAP 11 10/30/2020      The ASCVD Risk score (Arnett DK, et al., 2019) failed to calculate for the following reasons:   The valid HDL cholesterol range is 20 to 100 mg/dL    Assessment & Plan:   Problem List Items Addressed This Visit       Cardiovascular and Mediastinum   Congestive heart failure, NYHA class 2 ([facility]) (Chronic)  Asymptomatic today.  Well-controlled.  Out of Entresto. - She will follow-up with cardiology about her Entresto program      Benign essential HTN    Pressure elevated today.  Likely secondary to running out of Entresto.  She has been well-controlled. - Continue current regimen - Blood pressure recheck at next visit        Other   Hyperlipidemia    Started Lipitor 4 months ago.  Patient is compliant and tolerating well.  We will check BMP for liver function after starting a statin. - BMP      Other Visit Diagnoses     Primary hypertension    -  Primary   Relevant Orders   Basic Metabolic Panel       Return in about 6 months (around 11/19/2022) for Chronic conditions.    Leslie Dales, DO

## 2022-05-21 NOTE — Assessment & Plan Note (Signed)
Started Lipitor 4 months ago.  Patient is compliant and tolerating well.  We will check BMP for liver function after starting a statin. - BMP

## 2022-05-21 NOTE — Assessment & Plan Note (Signed)
Pressure elevated today.  Likely secondary to running out of Entresto.  She has been well-controlled. - Continue current regimen - Blood pressure recheck at next visit

## 2022-05-22 LAB — BASIC METABOLIC PANEL
BUN/Creatinine Ratio: 25 — ABNORMAL HIGH (ref 9–23)
BUN: 20 mg/dL (ref 6–24)
CO2: 19 mmol/L — ABNORMAL LOW (ref 20–29)
Calcium: 9.6 mg/dL (ref 8.7–10.2)
Chloride: 107 mmol/L — ABNORMAL HIGH (ref 96–106)
Creatinine, Ser: 0.79 mg/dL (ref 0.57–1.00)
Glucose: 85 mg/dL (ref 70–99)
Potassium: 4.4 mmol/L (ref 3.5–5.2)
Sodium: 142 mmol/L (ref 134–144)
eGFR: 86 mL/min/{1.73_m2} (ref >59)

## 2022-05-27 ENCOUNTER — Other Ambulatory Visit (HOSPITAL_COMMUNITY): Payer: Self-pay

## 2022-06-03 ENCOUNTER — Other Ambulatory Visit: Payer: Self-pay | Admitting: Student

## 2022-06-03 DIAGNOSIS — Z1231 Encounter for screening mammogram for malignant neoplasm of breast: Secondary | ICD-10-CM

## 2022-06-08 ENCOUNTER — Other Ambulatory Visit: Payer: Self-pay

## 2022-06-08 ENCOUNTER — Other Ambulatory Visit: Payer: Self-pay | Admitting: Student

## 2022-06-08 DIAGNOSIS — F17200 Nicotine dependence, unspecified, uncomplicated: Secondary | ICD-10-CM

## 2022-06-08 MED ORDER — VARENICLINE TARTRATE 0.5 MG PO TABS: 0.5000 mg | ORAL_TABLET | Freq: Two times a day (BID) | ORAL | 0 refills | Status: DC

## 2022-06-18 ENCOUNTER — Ambulatory Visit (INDEPENDENT_AMBULATORY_CARE_PROVIDER_SITE_OTHER): Payer: 59 | Admitting: Student

## 2022-06-18 ENCOUNTER — Encounter: Payer: Self-pay | Admitting: Student

## 2022-06-18 ENCOUNTER — Other Ambulatory Visit: Payer: Self-pay | Admitting: Student

## 2022-06-18 VITALS — BP 147/79 | HR 86 | Ht 67.0 in | Wt 126.4 lb

## 2022-06-18 DIAGNOSIS — F17200 Nicotine dependence, unspecified, uncomplicated: Secondary | ICD-10-CM

## 2022-06-18 DIAGNOSIS — I10 Essential (primary) hypertension: Secondary | ICD-10-CM

## 2022-06-18 DIAGNOSIS — N644 Mastodynia: Secondary | ICD-10-CM | POA: Diagnosis not present

## 2022-06-18 MED ORDER — NICOTINE 14 MG/24HR TD PT24: 14.0000 mg | MEDICATED_PATCH | Freq: Every morning | TRANSDERMAL | 1 refills | Status: DC

## 2022-06-18 NOTE — Patient Instructions (Addendum)
It was great to see you! Thank you for allowing me to participate in your care!   I recommend that you always bring your medications to each appointment as this makes it easy to ensure we are on the correct medications and helps [place] not miss when refills are needed.  Our plans for today:  -I have placed an order for diagnostic mammogram. You are scheduled for August 04, 2022 at 8:00 AM. At the [facility] breast center.  -If you are unable to make this appointment please call 24 hours in advance to reschedule.  Otherwise there will be a charge. -Please make follow-up for 2 to 4 weeks to discuss blood pressure. -Please follow-up with Dr. Valentina Lucks about smoking cessation.  I have refilled your patch.   Take care and seek immediate care sooner if you develop any concerns. Please remember to show up 15 minutes before your scheduled appointment time!  Leslie Dales, DO [facility]

## 2022-06-18 NOTE — Assessment & Plan Note (Addendum)
Present for several months.  Tenderness to palpation in upper outer quadrant of left breast on exam, no masses or adenopathy appreciated.  Right breast normal. - Diagnostic mammogram has been scheduled, includes ultrasound.

## 2022-06-18 NOTE — Assessment & Plan Note (Signed)
Follows with Dr. Valentina Lucks. - Please call Dr. Valentina Lucks - Refill nicotine patch

## 2022-06-18 NOTE — Assessment & Plan Note (Signed)
Blood pressure is elevated today.  She needs to contact her cardiologist to discuss being put back on Entresto.  We will follow-up in 2 to 4 weeks to discuss blood pressure management. - Follow-up 2 to 4 weeks - Consider starting ACE or ARB, if insurance will not cover [employer]

## 2022-06-18 NOTE — Progress Notes (Signed)
    SUBJECTIVE:   CHIEF COMPLAINT / HPI:   Left breast pain She reports this pain has been present for several months.  Aching pain in the left upper quadrant of left breast.  She believes that the left breast is larger than the right breast  She is having pruritus under the left breast.  Smoking cessation She would like her NicoDerm patch refilled today.  She follows with Dr. Valentina Lucks, and I encouraged her to return his last call.  HTN Patient reports that she needs to speak to her cardiologist about getting back on Entresto.  We discussed that we will have a follow-up appointment in the next 2 to 4 discuss blood pressure management.  PERTINENT  PMH / PSH: Tobacco use disorder, hypertension, hyperlipidemia  OBJECTIVE:   BP (!) 147/79   Pulse 86   Ht '5\' 7"'$  (1.702 m)   Wt 126 lb 6.4 oz (57.3 kg)   LMP 04/27/2013 Comment: Perimenapausal   SpO2 97%   BMI 19.80 kg/m    General: Nonacute distress, pleasant Cardiovascular: No edema on exam Respiratory: Normal work of breathing on room air Breast: Mild asymmetry in breast size between left and right, within normal limits.  Right breast not tender, no masses appreciated.  Left breast without masses, no adenopathy.  Tenderness to palpation in upper outer quadrant of left breast.  No rashes or lesions appreciated. *Chaperone: April CMA*  ASSESSMENT/PLAN:   Benign essential HTN Blood pressure is elevated today.  She needs to contact her cardiologist to discuss being put back on Entresto.  We will follow-up in 2 to 4 weeks to discuss blood pressure management. - Follow-up 2 to 4 weeks - Consider starting ACE or ARB, if insurance will not cover Entresto  Tobacco use disorder Follows with Dr. Valentina Lucks. - Please call Dr. Valentina Lucks - Refill nicotine patch  Breast tenderness in female Present for several months.  Tenderness to palpation in upper outer quadrant of left breast on exam, no masses or adenopathy appreciated.  Right breast normal. -  Diagnostic mammogram has been scheduled, includes ultrasound.  Follow up notes for provider 1.) Blood pressure management  Leslie Dales, [place]

## 2022-08-04 ENCOUNTER — Ambulatory Visit
Admission: RE | Admit: 2022-08-04 | Discharge: 2022-08-04 | Disposition: A | Discharge status: Home / Self Care | Payer: 59 | Source: Ambulatory Visit | Attending: Family Medicine | Admitting: Family Medicine

## 2022-08-04 ENCOUNTER — Ambulatory Visit: Payer: 59

## 2022-08-04 DIAGNOSIS — N644 Mastodynia: Secondary | ICD-10-CM

## 2022-08-19 ENCOUNTER — Encounter (HOSPITAL_BASED_OUTPATIENT_CLINIC_OR_DEPARTMENT_OTHER): Payer: Self-pay

## 2022-08-19 ENCOUNTER — Emergency Department (HOSPITAL_BASED_OUTPATIENT_CLINIC_OR_DEPARTMENT_OTHER)
Admission: EM | Admit: 2022-08-19 | Discharge: 2022-08-19 | Discharge status: Against Medical Advice | Payer: 59 | Attending: Emergency Medicine | Admitting: Emergency Medicine

## 2022-08-19 ENCOUNTER — Other Ambulatory Visit: Payer: Self-pay

## 2022-08-19 ENCOUNTER — Ambulatory Visit
Admission: RE | Admit: 2022-08-19 | Discharge: 2022-08-19 | Payer: 59 | Source: Ambulatory Visit | Attending: Student | Admitting: Student

## 2022-08-19 ENCOUNTER — Emergency Department (HOSPITAL_BASED_OUTPATIENT_CLINIC_OR_DEPARTMENT_OTHER): Payer: 59 | Admitting: Radiology

## 2022-08-19 VITALS — BP 133/84 | HR 77 | Temp 98.0°F | Resp 16

## 2022-08-19 DIAGNOSIS — R0602 Shortness of breath: Secondary | ICD-10-CM | POA: Insufficient documentation

## 2022-08-19 DIAGNOSIS — R1012 Left upper quadrant pain: Secondary | ICD-10-CM

## 2022-08-19 DIAGNOSIS — Z5321 Procedure and treatment not carried out due to patient leaving prior to being seen by health care provider: Secondary | ICD-10-CM | POA: Diagnosis not present

## 2022-08-19 LAB — URINALYSIS, ROUTINE W REFLEX MICROSCOPIC
Bilirubin Urine: NEGATIVE
Glucose, UA: NEGATIVE mg/dL
Hgb urine dipstick: NEGATIVE
Ketones, ur: NEGATIVE mg/dL
Leukocytes,Ua: NEGATIVE
Nitrite: NEGATIVE
Protein, ur: NEGATIVE mg/dL
Specific Gravity, Urine: <1.005 — ABNORMAL LOW (ref 1.005–1.030)
pH: 5 (ref 5.0–8.0)

## 2022-08-19 LAB — CBC
HCT: 40 % (ref 36.0–46.0)
Hemoglobin: 13.3 g/dL (ref 12.0–15.0)
MCH: 31.4 pg (ref 26.0–34.0)
MCHC: 33.3 g/dL (ref 30.0–36.0)
MCV: 94.6 fL (ref 80.0–100.0)
Platelets: 308 10*3/uL (ref 150–400)
RBC: 4.23 MIL/uL (ref 3.87–5.11)
RDW: 13 % (ref 11.5–15.5)
WBC: 7.4 10*3/uL (ref 4.0–10.5)
nRBC: 0 % (ref 0.0–0.2)

## 2022-08-19 LAB — COMPREHENSIVE METABOLIC PANEL
ALT: 11 U/L (ref 0–44)
AST: 15 U/L (ref 15–41)
Albumin: 4.4 g/dL (ref 3.5–5.0)
Alkaline Phosphatase: 66 U/L (ref 38–126)
Anion gap: 12 (ref 5–15)
BUN: 12 mg/dL (ref 6–20)
CO2: 23 mmol/L (ref 22–32)
Calcium: 9.2 mg/dL (ref 8.9–10.3)
Chloride: 106 mmol/L (ref 98–111)
Creatinine, Ser: 0.64 mg/dL (ref 0.44–1.00)
GFR, Estimated: >60 mL/min (ref >60)
Glucose, Bld: 84 mg/dL (ref 70–99)
Potassium: 4.1 mmol/L (ref 3.5–5.1)
Sodium: 141 mmol/L (ref 135–145)
Total Bilirubin: 0.2 mg/dL — ABNORMAL LOW (ref 0.3–1.2)
Total Protein: 7.2 g/dL (ref 6.5–8.1)

## 2022-08-19 LAB — LIPASE, BLOOD: Lipase: 49 U/L (ref 11–51)

## 2022-08-19 NOTE — ED Triage Notes (Signed)
Pt c/o pain under left breast onset ~ thurs. States it is positional like when she bends forward. Tried a stool softener without relief. States it feels like fluid around her heart like when she was first dx w/ heart failure. Has not been taking entresto b/c out of refills and concerned this is related.

## 2022-08-19 NOTE — ED Triage Notes (Signed)
Patient here POV from Home.  Endorses LUQ ABD Pain that began Thursday. Some SOB.   No N/V/D. No Constipation.   NAD Noted during Triage. A&Ox4. GCS 15. Ambulatory.

## 2022-08-19 NOTE — ED Notes (Signed)
Patient is being discharged from the Urgent Care and sent to the Emergency Department via personal vehicle with family . Per Provider Ewell Poe, patient is in need of higher level of care due to LUQ abdominal pain possible pertaining to CHF. Patient is aware and verbalizes understanding of plan of care.    Vitals:   08/19/22 1620  BP: 133/84  Pulse: 77  Resp: 16  Temp: 98 F (36.7 C)  SpO2: 97%

## 2022-08-19 NOTE — ED Provider Notes (Signed)
Patient here today for evaluation of discomfort to LUQ/ left chest similar to what she experienced when first diagnosed with CHF. She has not been taking entresto as prescribed. Recommended further evaluation in the ED for stat imaging and labs. Patient is agreeable. Daughter will transport via [place].    Francene Finders, PA-C 08/19/22 1627

## 2022-08-24 ENCOUNTER — Ambulatory Visit (INDEPENDENT_AMBULATORY_CARE_PROVIDER_SITE_OTHER): Payer: 59 | Admitting: Student

## 2022-08-24 ENCOUNTER — Encounter: Payer: Self-pay | Admitting: Student

## 2022-08-24 VITALS — BP 130/76 | HR 78 | Ht 67.0 in | Wt 127.8 lb

## 2022-08-24 DIAGNOSIS — R1012 Left upper quadrant pain: Secondary | ICD-10-CM | POA: Insufficient documentation

## 2022-08-24 MED ORDER — DICLOFENAC SODIUM 1 % EX GEL: 4.0000 g | Freq: Four times a day (QID) | CUTANEOUS | 0 refills | Status: AC

## 2022-08-24 MED ORDER — FAMOTIDINE 40 MG PO TABS: 40.0000 mg | ORAL_TABLET | Freq: Every day | ORAL | 0 refills | Status: DC

## 2022-08-24 NOTE — Assessment & Plan Note (Signed)
Ongoing for 2 weeks. Recent labs and symptoms make pancreatitis less likely (lipase 49). CMP wnl. CXR wnl less likely CAP. Does not seem to have acute CHF exacerbation although unable to obtain entresto due to cost-no dyspnea or LE swelling. Splenic abnormality possible but less likely as not pain to deep palpation. 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 Would also like to cover if reflux could be playing part in this. -trial pepcid 40 mg daily -voltaren gel prn -follow up if not improved in 1 week -return/ed precautions discussed

## 2022-08-24 NOTE — Patient Instructions (Addendum)
It was great to see you! Thank you for allowing me to participate in your care!   Our plans for today:  - I want to trial you on pepcid to see if reflux may be contributing to the abdominal pain - Also I have sent in voltaren gel to place on this area to see if that may help with the pain -Please return if having fevers, worsening abdominal pain, vomiting, bowel movement changes -return if not improved in a week  Take care and seek immediate care sooner if you develop any concerns.  Gerrit Heck, MD

## 2022-08-24 NOTE — Progress Notes (Signed)
    SUBJECTIVE:   CHIEF COMPLAINT / HPI: Left upper abdominal/lower breast pain  She says that she has been having pain "under her breast" on the left side. She says it gets better with passing gas or burping. She notice a bruise on her breast this morning but denies any known trauma. Describes pain as sometimes sharp but mostly dull/aggravating pain. No vomiting. Bowel movement atleast twice a week which is her normal. No hard stools or blood in stools. No fevers. Never had abdominal surgeries. Going on for two weeks. Recently seen in urgent care/ED for similar complaint and labs overall within normal limits. 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 She says it feels worse when bending down. Has not been taking entresto due to cost but denies anyh swelling or dyspnea. CXR recently was normal. Denies any radiation of pain. Recent mammogram wnl. No dysuria. She has been working out more and doing crunches and sit ups  PERTINENT  PMH / PSH: CHF  OBJECTIVE:   BP 130/76   Pulse 78   Ht '5\' 7"'$  (1.702 m)   Wt 127 lb 12.8 oz (58 kg)   LMP 04/27/2013 Comment: Perimenapausal   SpO2 100%   BMI 20.02 kg/m   General: Well appearing, NAD, awake, alert, responsive to questions Head: Normocephalic atraumatic CV: Regular rate and rhythm no murmurs rubs or gallops Respiratory: Clear to ausculation bilaterally, no wheezes rales or crackles, chest rises symmetrically,  no increased work of breathing Breast: right breast with 1-2 cm echymmosis in lower left quadrant, nontender to palpation below breast Abdomen: Soft, non-tender to deep palpation throughout, non-distended, normoactive bowel sounds, negative murphys, no CVA tenderness Extremities: Moves upper and lower extremities freely, no edema in LE  CMA Tashira Legette present for Breast exam  ASSESSMENT/PLAN:   Left upper quadrant abdominal pain Ongoing for 2 weeks. Recent labs and symptoms make pancreatitis less likely  (lipase 49). CMP wnl. CXR wnl less likely CAP. Does not seem to have acute CHF exacerbation although unable to obtain entresto due to cost-no dyspnea or LE swelling. Splenic abnormality possible but less likely as not pain to deep palpation. 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 Would also like to cover if reflux could be playing part in this. -trial pepcid 40 mg daily -voltaren gel prn -follow up if not improved in 1 week -return/ed precautions discussed   Gerrit Heck, MD [place]

## 2022-09-04 ENCOUNTER — Other Ambulatory Visit (HOSPITAL_COMMUNITY): Payer: Self-pay | Admitting: Internal Medicine

## 2022-09-07 ENCOUNTER — Other Ambulatory Visit: Payer: Self-pay | Admitting: Student

## 2022-09-07 ENCOUNTER — Telehealth: Payer: Self-pay | Admitting: Student

## 2022-09-07 DIAGNOSIS — R1012 Left upper quadrant pain: Secondary | ICD-10-CM

## 2022-09-07 NOTE — Telephone Encounter (Signed)
Notified patient that FMLA paper work is complete. FMLA is written for Daughter. Daughter may take 8 hours per day up to 2 days per month, for 6 months to take mother to appointments and care for he when she is acutely ill. They will have to re-certify in 6 months.

## 2022-10-18 ENCOUNTER — Other Ambulatory Visit (HOSPITAL_COMMUNITY): Payer: Self-pay | Admitting: Cardiology

## 2022-11-02 ENCOUNTER — Other Ambulatory Visit (HOSPITAL_COMMUNITY): Payer: Self-pay | Admitting: Cardiology

## 2023-04-28 ENCOUNTER — Other Ambulatory Visit (HOSPITAL_COMMUNITY): Payer: Self-pay | Admitting: Cardiology

## 2023-04-28 ENCOUNTER — Telehealth: Payer: Self-pay

## 2023-04-28 DIAGNOSIS — I5022 Chronic systolic (congestive) heart failure: Secondary | ICD-10-CM

## 2023-04-28 NOTE — Telephone Encounter (Signed)
Patient calls nurse line to report that she is not taking her medications as directed.   She states that she has not been taking her Entresto, spironolactone or carvedilol.   She reports that she has a burning sensation in her chest when she "walks about a block." This has been going on for about one week. She believes that this is related to her not taking her medications.   She denies any current chest pain, SHOB, difficulty breathing, diaphoresis, arm pain, jaw tightness.   She also wants to make provider aware of pain in her heel that has been going on for the last two months.   Scheduled patient follow up in our office next week. However, I did recommend that she call cardiologist today as she is also due for an appointment with them and to further discuss medications and burning in chest.   Discussed ED precautions with patient.   Veronda Prude, RN

## 2023-05-04 ENCOUNTER — Encounter: Payer: Self-pay | Admitting: Student

## 2023-05-04 ENCOUNTER — Ambulatory Visit (INDEPENDENT_AMBULATORY_CARE_PROVIDER_SITE_OTHER): Payer: Medicaid Other | Admitting: Student

## 2023-05-04 VITALS — BP 178/84 | HR 85 | Ht 61.0 in | Wt 129.0 lb

## 2023-05-04 DIAGNOSIS — I10 Essential (primary) hypertension: Secondary | ICD-10-CM

## 2023-05-04 DIAGNOSIS — K2100 Gastro-esophageal reflux disease with esophagitis, without bleeding: Secondary | ICD-10-CM | POA: Diagnosis not present

## 2023-05-04 DIAGNOSIS — I5022 Chronic systolic (congestive) heart failure: Secondary | ICD-10-CM

## 2023-05-04 DIAGNOSIS — E7800 Pure hypercholesterolemia, unspecified: Secondary | ICD-10-CM

## 2023-05-04 DIAGNOSIS — R0789 Other chest pain: Secondary | ICD-10-CM

## 2023-05-04 DIAGNOSIS — I2089 Other forms of angina pectoris: Secondary | ICD-10-CM

## 2023-05-04 MED ORDER — PANTOPRAZOLE SODIUM 40 MG PO TBEC: 40.0000 mg | DELAYED_RELEASE_TABLET | Freq: Every day | ORAL | 2 refills | Status: DC

## 2023-05-04 MED ORDER — NITROGLYCERIN 0.3 MG SL SUBL: 0.3000 mg | SUBLINGUAL_TABLET | SUBLINGUAL | 1 refills | Status: DC | PRN

## 2023-05-04 MED ORDER — SPIRONOLACTONE 25 MG PO TABS: 25.0000 mg | ORAL_TABLET | Freq: Every day | ORAL | 0 refills | Status: DC

## 2023-05-04 MED ORDER — ENTRESTO 97-103 MG PO TABS: 1.0000 | ORAL_TABLET | Freq: Two times a day (BID) | ORAL | 5 refills | Status: DC

## 2023-05-04 MED ORDER — SPIRONOLACTONE 25 MG PO TABS: 25.0000 mg | ORAL_TABLET | Freq: Every day | ORAL | 1 refills | Status: DC

## 2023-05-04 MED ORDER — ENTRESTO 24-26 MG PO TABS: ORAL_TABLET | ORAL | 0 refills | Status: AC

## 2023-05-04 NOTE — Assessment & Plan Note (Signed)
Likely contributing to burning sensation in chest.  Will try PPI.

## 2023-05-04 NOTE — Progress Notes (Signed)
    SUBJECTIVE:   CHIEF COMPLAINT / HPI:   Chest pain Patient is experiencing exertional chest pain, reports burning pain and shortness of breath after climbing 1 flight of stairs and walking less than 1 block. Symptoms worsening over past 1-2 months. History of CHF and prior stent.  She also has high blood pressure.  She has been without her Entresto for over a year, and without spironolactone for several months.  She is not taking her medication for GERD.  Patient still smoking about half pack a day.  PERTINENT  PMH / PSH: CHF, GERD, HTN, hyperlipidemia, tobacco use  OBJECTIVE:   BP (!) 178/84   Pulse 85   Ht 5\' 1"  (1.549 m)   Wt 129 lb (58.5 kg)   LMP 04/27/2013 Comment: Perimenapausal   SpO2 98%   BMI 24.37 kg/m    General: NAD, pleasant Cardio: RRR, no MRG. Cap Refill <2s. Respiratory: CTAB, normal wob on RA GI: Abdomen is soft, not tender, not distended. BS present Skin: Warm and dry   ASSESSMENT/PLAN:   Chronic systolic congestive heart failure, NYHA class 2 (HCC) Assessment & Plan: Without Entresto for 1 year, spironolactone several months.  Experiencing exertional dyspnea CHF versus stable angina.  Euvolemic on exam today.  Plan to restart Entresto and titrate.  Restart spironolactone.  Will obtain lab work as below. Echo on 8/29 with same day cardiology appointment.  Orders: -     Spironolactone; Take 1 tablet (25 mg total) by mouth daily. Dispense: 60 tablet; Refill: 1 -     Basic metabolic panel -     CBC -     Lipid panel -     Entresto; Take 1 tablet by mouth 2 (two) times daily for 14 days, THEN 2 tablets 2 (two) times daily for 14 days.  Dispense: 84 tablet; Refill: 0  Gastroesophageal reflux disease with esophagitis, unspecified whether hemorrhage Assessment & Plan: Likely contributing to burning sensation in chest.  Will try PPI.  Orders: -     Pantoprazole Sodium; Take 1 tablet (40 mg total) by mouth daily.  Dispense: 30 tablet; Refill: 2  Stable  angina Assessment & Plan: History of prior stent.  Symptoms consistently occurring with exertion.  Suspect CAD with her comorbidities. Continue Lipitor. Cardiology appointment on August 20 05/2023. Trial nitroglycerin as needed.  ED precautions discussed.  Orders: -     Nitroglycerin; Place 1 tablet (0.3 mg total) under the tongue every 5 (five) minutes as needed for chest pain (If does not improve after 5 minutes, please seek immediate medical care by 911 or emergency room.).  Dispense: 90 tablet; Refill: 1  Benign essential HTN Assessment & Plan: Taking carvedilol 6.25mg  BID.  Restart Entresto and spironolactone.  Will follow-up at next appointment.   Tiffany Kocher, DO [facility]

## 2023-05-04 NOTE — Assessment & Plan Note (Signed)
Taking carvedilol.  Restart Entresto and spironolactone.  Will follow-up at next appointment.

## 2023-05-04 NOTE — Assessment & Plan Note (Signed)
Without Entresto for 1 year, spironolactone several months.  Experiencing exertional dyspnea CHF versus stable angina.  Euvolemic on exam today.  Plan to restart Entresto and titrate.  Restart spironolactone.  Will obtain lab work as below.

## 2023-05-04 NOTE — Patient Instructions (Addendum)
It was great to see you! Thank you for allowing me to participate in your care!   I recommend that you always bring your medications to each appointment as this makes it easy to ensure we are on the correct medications and helps [place] not miss when refills are needed.  Our plans for today:  - Take one Entresto pill twice a day for 14 days. Then take two entresto pills twice a day for 14 days. Your cardiologist may make adjustments as necessary. - Take one tablet of spironolactone daily -Please follow-up with cardiologist  We are checking some labs today, I will call you if they are abnormal will send you a MyChart message or a letter if they are normal.  If you do not hear about your labs in the next 2 weeks please let us know.  Take care and seek immediate care sooner if you develop any concerns. Please remember to show up 15 minutes before your scheduled appointment time!  Tiffany Kocher, DO [facility]

## 2023-05-04 NOTE — Assessment & Plan Note (Addendum)
History of prior stent.  Symptoms consistently occurring with exertion.  Suspect CAD with her comorbidities.  Cardiology appointment on August 20 05/2023.  Trial nitroglycerin as needed.  ED precautions discussed.

## 2023-05-06 ENCOUNTER — Telehealth: Payer: Self-pay | Admitting: Student

## 2023-05-06 ENCOUNTER — Other Ambulatory Visit: Payer: Self-pay | Admitting: Student

## 2023-05-06 DIAGNOSIS — E7800 Pure hypercholesterolemia, unspecified: Secondary | ICD-10-CM

## 2023-05-06 MED ORDER — ATORVASTATIN CALCIUM 40 MG PO TABS: 40.0000 mg | ORAL_TABLET | Freq: Every day | ORAL | 1 refills | Status: DC

## 2023-05-06 NOTE — Telephone Encounter (Signed)
Called patient and confirmed identity. Discussed elevated cholesterol and LDL of 120. ASCVD risk greater than 7.5%, needing moderate to high intensity statins.  She was currently taking 10 mg, started by prior PCP in 12/31/2021.  Discussed risk and benefits of increasing her statin.  Patient agrees to increase Lipitor to 40 mg daily.  She will follow-up with cardiologist on 05/20/2023.

## 2023-05-20 ENCOUNTER — Encounter (HOSPITAL_COMMUNITY): Payer: Self-pay

## 2023-05-20 ENCOUNTER — Ambulatory Visit (HOSPITAL_BASED_OUTPATIENT_CLINIC_OR_DEPARTMENT_OTHER)
Admission: RE | Admit: 2023-05-20 | Discharge: 2023-05-20 | Disposition: A | Discharge status: Home / Self Care | Payer: Medicaid Other | Source: Ambulatory Visit

## 2023-05-20 ENCOUNTER — Ambulatory Visit (HOSPITAL_COMMUNITY)
Admission: RE | Admit: 2023-05-20 | Discharge: 2023-05-20 | Disposition: A | Discharge status: Home / Self Care | Payer: Medicaid Other | Source: Ambulatory Visit | Attending: Cardiology | Admitting: Cardiology

## 2023-05-20 VITALS — BP 134/90 | HR 86 | Wt 125.6 lb

## 2023-05-20 DIAGNOSIS — F1090 Alcohol use, unspecified, uncomplicated: Secondary | ICD-10-CM | POA: Insufficient documentation

## 2023-05-20 DIAGNOSIS — E785 Hyperlipidemia, unspecified: Secondary | ICD-10-CM | POA: Insufficient documentation

## 2023-05-20 DIAGNOSIS — R9431 Abnormal electrocardiogram [ECG] [EKG]: Secondary | ICD-10-CM | POA: Diagnosis not present

## 2023-05-20 DIAGNOSIS — I5022 Chronic systolic (congestive) heart failure: Secondary | ICD-10-CM

## 2023-05-20 DIAGNOSIS — I10 Essential (primary) hypertension: Secondary | ICD-10-CM | POA: Diagnosis not present

## 2023-05-20 DIAGNOSIS — F17210 Nicotine dependence, cigarettes, uncomplicated: Secondary | ICD-10-CM | POA: Diagnosis not present

## 2023-05-20 DIAGNOSIS — R0789 Other chest pain: Secondary | ICD-10-CM | POA: Insufficient documentation

## 2023-05-20 DIAGNOSIS — I110 Hypertensive heart disease with heart failure: Secondary | ICD-10-CM | POA: Insufficient documentation

## 2023-05-20 DIAGNOSIS — I428 Other cardiomyopathies: Secondary | ICD-10-CM | POA: Diagnosis not present

## 2023-05-20 LAB — BASIC METABOLIC PANEL
Anion gap: 12 (ref 5–15)
BUN: 9 mg/dL (ref 6–20)
CO2: 19 mmol/L — ABNORMAL LOW (ref 22–32)
Calcium: 8.9 mg/dL (ref 8.9–10.3)
Chloride: 108 mmol/L (ref 98–111)
Creatinine, Ser: 0.67 mg/dL (ref 0.44–1.00)
GFR, Estimated: >60 mL/min (ref >60)
Glucose, Bld: 74 mg/dL (ref 70–99)
Potassium: 4.1 mmol/L (ref 3.5–5.1)
Sodium: 139 mmol/L (ref 135–145)

## 2023-05-20 LAB — ECHOCARDIOGRAM COMPLETE
Area-P 1/2: 3.87 cm2
P 1/2 time: 509 msec
S' Lateral: 3.8 cm

## 2023-05-20 LAB — BRAIN NATRIURETIC PEPTIDE: B Natriuretic Peptide: 10.1 pg/mL (ref 0.0–100.0)

## 2023-05-20 MED ORDER — CARVEDILOL 12.5 MG PO TABS: 12.5000 mg | ORAL_TABLET | Freq: Two times a day (BID) | ORAL | 3 refills | Status: DC

## 2023-05-20 NOTE — Progress Notes (Signed)
ADVANCED HF CLINIC  NOTE  Referring Physician: Dr. Shirlean Mylar [facility])  Primary Care: Dr. Shirlean Mylar [facility])  Primary Cardiologist: Dr. Eden Emms (not seen since 2016) [facility]: Dr. Gala Romney   Reason for Visit: f/u for chronic systolic heart failure   HPI:  Angela Serrano is a 62 y/o woman with HTN, GERD with esophageal stricture s/p dilation in 2016 and tobacco use referred by Dr. Leary Roca for further evaluation of her HF.  Saw Dr. Eden Emms in 2016 for moderate pericardial effusion found incidentally on chest CT.  TEE 1/16 EF 55-60% and normal valves. Treated with NSAIDs for pleuropericarditis.   Echo 4/21 EF 35-40% global HK grade II DD.   We saw her for the first time in May 2021. Was not feeling well. NYHA III. Underwent R/L cath: EF 40-45% no CAD.  Normal RHC.   She had cMRI 07/02/20 which showed EF up to 50% w/ diffuse mid myocardial gadolinium uptake non-specific can be seen in non ischemic DCM. RV normal.   Presents back to clinic today for f/u. Last visit was 2/22. Here w/ her sister and her daughter. Had echo done prior to appt. Official read not completed by physician yet but appears ~50%.   She admits that she had ran out of several of her HF meds including Entresto for over a year given lost of insurance, but she now has medicaid. She was seen earlier this month by her PCP and restarted Entresto and also started on a statin, LDL elevated at 120 mg/dL. Not SOB w/ basic ADLs and walking on flat surfaces but SOB ambulating up stairs.   Smokes ~ 1/2 ppd. Drinking two 24 oz beers 1-2 per day (down from 4).   EKG shows NSR 88 bpm. BP 134/90.    R/L cath  02/16/20 Ao = 143/76 (103) LV = 132/4 RA = 2 RV = 26/4 PA = 26/8 (17) PCW = 6 Fick cardiac output/index = 3.2/2.4 PVR = 2.9 WU Ao sat = 98% PA sat = 69%, 69%   Assessment: 1. Normal coronary arteries 2. Mild to moderate NICM EF 40-45% 3. Normal hemodynamics  cMRI 10/21 IMPRESSION: 1.  Mild LVE with diffuse  hypokinesis worse in the apex EF 50%   2. Diffuse mid myocardial gadolinium uptake non-specific can be seen in non ischemic DCM   3.  Normal RV size and function   4.  Trivial lateral pericardial effusion   5.  Tri leaflet AV with mild appearing AR   6.  Parametric numbers show mildly elevated T2 and ECV    Past Medical History:  Diagnosis Date   Acid reflux disease    Benign essential HTN    CHF (congestive heart failure), NYHA class I (HCC) 01/17/2015   Poor dentition    Seasonal allergies 2011   sneezing and hoarseness    Current Outpatient Medications  Medication Sig Dispense Refill   atorvastatin (LIPITOR) 40 MG tablet Take 1 tablet (40 mg total) by mouth daily. 60 tablet 1   famotidine (PEPCID) 40 MG tablet TAKE 1 TABLET BY MOUTH EVERY DAY 90 tablet 1   nitroGLYCERIN (NITROSTAT) 0.3 MG SL tablet Place 1 tablet (0.3 mg total) under the tongue every 5 (five) minutes as needed for chest pain (If does not improve after 5 minutes, please seek immediate medical care by 911 or emergency room.). 90 tablet 1   pantoprazole (PROTONIX) 40 MG tablet Take 1 tablet (40 mg total) by mouth daily. 30 tablet 2  sacubitril-valsartan (ENTRESTO) 24-26 MG Take 1 tablet by mouth 2 (two) times daily for 14 days, THEN 2 tablets 2 (two) times daily for 14 days. 84 tablet 0   spironolactone (ALDACTONE) 25 MG tablet Take 1 tablet (25 mg total) by mouth daily. 60 tablet 1   carvedilol (COREG) 12.5 MG tablet Take 1 tablet (12.5 mg total) by mouth 2 (two) times daily. 180 tablet 3   No current facility-administered medications for this encounter.    No Known Allergies    Social History   Socioeconomic History   Marital status: Single    Spouse name: Not on file   Number of children: 3   Years of education: Not on file   Highest education level: Not on file  Occupational History   Occupation: Guest Games developer: [employer]    Comment: [employer]   Tobacco Use   Smoking  status: Every Day    Current packs/day: 0.50    Average packs/day: 0.5 packs/day for 44.7 years (22.3 ttl pk-yrs)    Types: Cigarettes    Start date: 09/21/1978   Smokeless tobacco: Never   Tobacco comments:    About 1/2 pack per day.  Vaping Use   Vaping status: Never Used  Substance and Sexual Activity   Alcohol use: Yes    Alcohol/week: 2.0 standard drinks of alcohol    Types: 2 Cans of beer per week    Comment: 3 beer per night for sleep   Drug use: No   Sexual activity: Not Currently    Birth control/protection: None  Other Topics Concern   Not on file  Social History Narrative   Lives with daughters, and 2 grandchildren. Total of 4 grandchildren, likes to dance for exercise. Works at [employer] and [employer] now.   Social Determinants of Health   Financial Resource Strain: Not on file  Food Insecurity: Not on file  Transportation Needs: Not on file  Physical Activity: Not on file  Stress: Not on file  Social Connections: Not on file  Intimate Partner Violence: Not on file      Family History  Problem Relation Age of Onset   Cancer Mother 27       Bone   Alcohol abuse Father 74   Hypertension Sister    Hyperlipidemia Sister    Hypertension Sister    Thyroid disease Daughter    Diabetes Maternal Aunt    Cancer Maternal Aunt 33       Bone    Diabetes Maternal Grandmother    Cancer Maternal Aunt 28       Bone     Vitals:   05/20/23 1538  BP: (!) 134/90  Pulse: 86  SpO2: 98%  Weight: 57 kg (125 lb 9.6 oz)    PHYSICAL EXAM: General:  Well appearing, thin. No resp difficulty HEENT: normal Neck: supple. no JVD. Carotids 2+ bilat; no bruits. No lymphadenopathy or thryomegaly appreciated. Cor: PMI nondisplaced. Regular rate & rhythm. No rubs, gallops or murmurs. Lungs: clear but mildly decreased  Abdomen: soft, nontender, nondistended. No hepatosplenomegaly. No bruits or masses. Good bowel sounds. Extremities: no cyanosis, clubbing, rash, edema Neuro: alert &  orientedx3, cranial nerves grossly intact. moves all 4 extremities w/o difficulty. Affect pleasant    ASSESSMENT & PLAN:  1. Chronic systolic HF/chest pressure - Echo 4/21 EF 35-40% global HK grade II DD. Unclear etiology suspect HTN +/- ETOH - R/L Cath 5/21 Normal cors, EF 40-45% RHC ok - cMRI  10/21 showed low normal EF 50% w/ diffuse mid myocardial gadolinium uptake non-specific can be seen in non ischemic DCM. RV normal.  - Echo repeated today, EF appears ~50% on my read. MD interpretation pending - NYHA II. Euvolemic on exam  - recently restarted on Entresto 24-26 mg bid. Will check BMP today to see if we can increase (recent K prior to initiation was 4.7). Continue current dose for now - Continue spiro 25 mg daily  - Increase carvedilol to 12.5 mg bid  - do not think she needs loop diuretic, will check BNP today  - we again discussed need for ETOH cessation. Advised to reduce consumption    2. Severe HTN - moderately elevated - GDMT up titration per above - BMP today   3. ETOH use - discussed possibility that this could be cause of reduced EF - discussed need to reduce intake  4. Tobacco use - still smoking 1/2 ppd  - encouraged cessation  5. HLD - recent LP 8/24 LDL elevated 120, started on statin by PCP  - recommend f/u LP and HFT in 6-8 wks   F/u in 3 wks for further med titration    Robbie Lis, PA-C  4:04 PM

## 2023-05-20 NOTE — Patient Instructions (Addendum)
Labs done today. We will contact you only if your labs are abnormal.  INCREASE Carvedilol to 12.5mg  (1 tablet) by mouth 2 times daily.   No other medication changes were made. Please continue all current medications as prescribed.  Your physician recommends that you schedule a follow-up appointment in: 4 weeks with our NP/PA Clinic here in our office.   If you have any questions or concerns before your next appointment please send [place] a message through [place] or call our office at [phone].    TO LEAVE A MESSAGE FOR THE NURSE SELECT OPTION 2, PLEASE LEAVE A MESSAGE INCLUDING: YOUR NAME DATE OF BIRTH CALL BACK NUMBER REASON FOR CALL**this is important as we prioritize the call backs  YOU WILL RECEIVE A CALL BACK THE SAME DAY AS LONG AS YOU CALL BEFORE 4:00 PM   Do the following things EVERYDAY: Weigh yourself in the morning before breakfast. Write it down and keep it in a log. Take your medicines as prescribed Eat low salt foods--Limit salt (sodium) to 2000 mg per day.  Stay as active as you can everyday Limit all fluids for the day to less than 2 liters   At the [facility], you and your health needs are our priority. As part of our continuing mission to provide you with exceptional heart care, we have created designated Provider Care Teams. These Care Teams include your primary Cardiologist (physician) and Advanced Practice Providers (APPs- Physician Assistants and Nurse Practitioners) who all work together to provide you with the care you need, when you need it.   You may see any of the following providers on your designated Care Team at your next follow up: Dr Arvilla Meres Dr Marca Ancona Dr. Marcos Eke, NP Robbie Lis, [place] [facility] [place], [place] Brynda Peon, NP Karle Plumber, PharmD   Please be sure to bring in all your medications bottles to every appointment.    Thank you for choosing   [facility]

## 2023-05-28 ENCOUNTER — Encounter: Payer: Self-pay | Admitting: Pharmacist

## 2023-06-04 ENCOUNTER — Encounter: Payer: Self-pay | Admitting: Student

## 2023-06-04 ENCOUNTER — Ambulatory Visit (INDEPENDENT_AMBULATORY_CARE_PROVIDER_SITE_OTHER): Payer: Medicaid Other | Admitting: Student

## 2023-06-04 VITALS — BP 160/80 | HR 73 | Ht 61.0 in | Wt 129.4 lb

## 2023-06-04 DIAGNOSIS — I10 Essential (primary) hypertension: Secondary | ICD-10-CM | POA: Diagnosis not present

## 2023-06-04 DIAGNOSIS — Z0000 Encounter for general adult medical examination without abnormal findings: Secondary | ICD-10-CM

## 2023-06-04 DIAGNOSIS — E7800 Pure hypercholesterolemia, unspecified: Secondary | ICD-10-CM | POA: Diagnosis not present

## 2023-06-04 DIAGNOSIS — F17200 Nicotine dependence, unspecified, uncomplicated: Secondary | ICD-10-CM

## 2023-06-04 DIAGNOSIS — I5022 Chronic systolic (congestive) heart failure: Secondary | ICD-10-CM | POA: Diagnosis present

## 2023-06-04 MED ORDER — ENTRESTO 24-26 MG PO TABS: ORAL_TABLET | ORAL | 0 refills | Status: DC

## 2023-06-04 NOTE — Assessment & Plan Note (Signed)
Declined flu shot today.

## 2023-06-04 NOTE — Assessment & Plan Note (Signed)
Meets criteria for screening. Still smoking. Discussed cessation.  -LDCT ordered -Nurse will call patient with appointment time

## 2023-06-04 NOTE — Assessment & Plan Note (Signed)
On 40 mg Lipitor, compliant and without side-effects -Repeat LDL cholesterol at later date

## 2023-06-04 NOTE — Progress Notes (Signed)
    SUBJECTIVE:   CHIEF COMPLAINT / HPI:    CHF  HTN Uncontrolled high blood pressure in office at last visit.  Remains elevated today.  Repeat echocardiogram on 8/29 showed LVEF of 45-50%, global hypokinesis of left ventricle and grade 1 diastolic dysfunction.  Also has mild to moderate aortic valve regurgitation.  Patient has not been picking up or taking her Entresto.  There was once a time when she was unable to afford medication, but she has insurance now that should cover Entresto.  She is compliant with metoprolol and spironolactone without side effects.  HLD Recently increase Lipitor to 40 mg daily.  Patient is compliant and without side effects.  GERD Patient states that she is no longer having heartburn and has not been taking her pantoprazole.  OBJECTIVE:   BP (!) 160/80   Pulse 73   Ht 5\' 1"  (1.549 m)   Wt 129 lb 6.4 oz (58.7 kg)   LMP 04/27/2013 Comment: Perimenapausal   SpO2 100%   BMI 24.45 kg/m    General: NAD, pleasant Cardio: RRR, no MRG. No edema. Respiratory: CTAB, normal wob on RA GI: Abdomen is soft, not tender, not distended. BS present  ASSESSMENT/PLAN:   Assessment & Plan Chronic systolic congestive heart failure, NYHA class 2 (HCC) Followed by cardiology.  Her next appointment is on 9/23.  Echo cardiogram is updated and findings listed above.  Recommend continue metoprolol and spironolactone - Entresto 24-26 mg reordered, she will start with 1 tablet twice daily for 2 weeks and then increase to 2 tablets twice daily -Patient will follow-up with cardiology Benign essential HTN Repeat BP 160/80. Compliant with metop and spironolactone. Entresto as above. Annual labs UTD. Health care maintenance Declined flu shot today Pure hypercholesterolemia On 40 mg Lipitor, compliant and without side-effects -Repeat LDL cholesterol at later date Tobacco use disorder Meets criteria for screening. Still smoking. Discussed cessation.  -LDCT ordered -Nurse  will call patient with appointment time   Tiffany Kocher, DO [facility]

## 2023-06-04 NOTE — Assessment & Plan Note (Signed)
Repeat BP 160/80. Compliant with metop and spironolactone. Entresto as above. Annual labs UTD.

## 2023-06-04 NOTE — Assessment & Plan Note (Signed)
Followed by cardiology.  Her next appointment is on 9/23.  Echo cardiogram is updated and findings listed above.  Recommend continue metoprolol and spironolactone - Entresto 24-26 mg reordered, she will start with 1 tablet twice daily for 2 weeks and then increase to 2 tablets twice daily -Patient will follow-up with cardiology

## 2023-06-04 NOTE — Patient Instructions (Signed)
It was great to see you! Thank you for allowing me to participate in your care!   I recommend that you always bring your medications to each appointment as this makes it easy to ensure we are on the correct medications and helps [place] not miss when refills are needed.  Our plans for today:  - Please take your entresto as prescribed - Please purchase heel pad and I recommend comfortable walking shoes for your heel pain. If it does not improve, please follow-up. -Continue taking your statin  We are checking some labs today, I will call you if they are abnormal will send you a MyChart message or a letter if they are normal.  If you do not hear about your labs in the next 2 weeks please let us know.  Take care and seek immediate care sooner if you develop any concerns. Please remember to show up 15 minutes before your scheduled appointment time!  Tiffany Kocher, DO [facility]

## 2023-06-09 ENCOUNTER — Encounter: Payer: Self-pay | Admitting: Pharmacist

## 2023-06-11 ENCOUNTER — Other Ambulatory Visit: Payer: Self-pay | Admitting: Student

## 2023-06-11 DIAGNOSIS — I2089 Other forms of angina pectoris: Secondary | ICD-10-CM

## 2023-06-11 NOTE — Progress Notes (Signed)
ADVANCED HF CLINIC NOTE   Primary Care: Dr. Shirlean Mylar [facility])  Primary Cardiologist: Dr. Eden Emms (not seen since 2016) AHFC: Dr. Gala Romney   Reason for Visit: f/u for chronic systolic heart failure   HPI: Ms. Winchester is a 62 y.o. woman with HTN, GERD with esophageal stricture s/p dilation in 2016 and tobacco use referred by Dr. Leary Roca for further evaluation of her HF.  Saw Dr. Eden Emms in 2016 for moderate pericardial effusion found incidentally on chest CT.  TEE 1/16 EF 55-60% and normal valves. Treated with NSAIDs for pleuropericarditis.   Echo 4/21 EF 35-40% global HK grade II DD.   We saw her for the first time 01/2020. Was not feeling well. NYHA III. Underwent R/L cath: EF 40-45% no CAD.  Normal RHC.   cMRI 07/02/20 which showed EF up to 50% w/ diffuse mid myocardial gadolinium uptake non-specific can be seen in non ischemic DCM. RV normal.   Echo 4/23 showed EF 55-60%, G1DD, normal RV  Follow up 05/20/23, last seen 10/2020. Off most GDMT  Echo 8/24 showed EF 45-50%.  Today she returns for HF follow up. Overall feeling fine. She has SOB walking up steps. A couple episodes of chest "stinging" this past weeks, occurred when she was upset, resolved spontaneously. Occasional palpitations. Denies dizziness, edema, or PND/Orthopnea. Appetite ok. No fever or chills. Weight at home 126-127 pounds. Taking all medications but not Entresto (cost prohibitive). She has had a stressful year, quit her job in 11/2022. Smokes 6-8 cig/day, 3-5 tall boy/beers a day, no drugs. She lives with her daughter. She works as a Conservation officer, nature at [employer].  Cardiac Studies - Echo (8/24): EF 45-50%, G1DD, normal RV  - Echo (4/23): EF 55-60%, G1DD, normal RV  - cMRI (10/21): LVEF 50% with diffuse HK in apex, normal RV, tri leaflet AV with mild appearing AR,  non-specific diffuse mid myocardia LGE, can be seen in non-ischemic DCM  - R/LHC (5/21): normal cors, EF 40-45%, normal hemodynamics Ao = 143/76 (103) LV =  132/4 RA = 2 RV = 26/4 PA = 26/8 (17) PCW = 6 Fick cardiac output/index = 3.2/2.4 PVR = 2.9 WU Ao sat = 98% PA sat = 69%, 69%   Past Medical History:  Diagnosis Date   Acid reflux disease    Benign essential HTN    CHF (congestive heart failure), NYHA class I (HCC) 01/17/2015   Poor dentition    Seasonal allergies 2011   sneezing and hoarseness   Current Outpatient Medications  Medication Sig Dispense Refill   atorvastatin (LIPITOR) 40 MG tablet Take 1 tablet (40 mg total) by mouth daily. 60 tablet 1   carvedilol (COREG) 12.5 MG tablet Take 1 tablet (12.5 mg total) by mouth 2 (two) times daily. 180 tablet 3   nitroGLYCERIN (NITROSTAT) 0.3 MG SL tablet PLACE 1 TABLET (0.3 MG TOTAL) UNDER THE TONGUE EVERY 5 (FIVE) MINUTES AS NEEDED FOR CHEST PAIN (IF DOES NOT IMPROVE AFTER 5 MINUTES, PLEASE SEEK IMMEDIATE MEDICAL CARE BY 911 OR EMERGENCY ROOM.). 300 tablet 1   spironolactone (ALDACTONE) 25 MG tablet Take 1 tablet (25 mg total) by mouth daily. 60 tablet 1   sacubitril-valsartan (ENTRESTO) 24-26 MG Take 1 tablet by mouth 2 (two) times daily for 14 days, THEN 2 tablets 2 (two) times daily for 14 days. (Patient not taking: Reported on 06/14/2023) 84 tablet 0   No current facility-administered medications for this encounter.   No Known Allergies  Social History   Socioeconomic History  Marital status: Single    Spouse name: Not on file   Number of children: 3   Years of education: Not on file   Highest education level: Not on file  Occupational History   Occupation: Guest Games developer: [employer]    Comment: [employer]   Tobacco Use   Smoking status: Every Day    Current packs/day: 0.50    Average packs/day: 0.5 packs/day for 44.7 years (22.4 ttl pk-yrs)    Types: Cigarettes    Start date: 09/21/1978   Smokeless tobacco: Never   Tobacco comments:    About 1/2 pack per day.  Vaping Use   Vaping status: Never Used  Substance and Sexual Activity   Alcohol  use: Yes    Alcohol/week: 2.0 standard drinks of alcohol    Types: 2 Cans of beer per week    Comment: 3 beer per night for sleep   Drug use: No   Sexual activity: Not Currently    Birth control/protection: None  Other Topics Concern   Not on file  Social History Narrative   Lives with daughters, and 2 grandchildren. Total of 4 grandchildren, likes to dance for exercise. Works at [employer] and [employer] now.   Social Determinants of Health   Financial Resource Strain: Not on file  Food Insecurity: Not on file  Transportation Needs: Not on file  Physical Activity: Not on file  Stress: Not on file  Social Connections: Not on file  Intimate Partner Violence: Not on file   Family History  Problem Relation Age of Onset   Cancer Mother 55       Bone   Alcohol abuse Father 44   Hypertension Sister    Hyperlipidemia Sister    Hypertension Sister    Thyroid disease Daughter    Diabetes Maternal Aunt    Cancer Maternal Aunt 109       Bone    Diabetes Maternal Grandmother    Cancer Maternal Aunt 59       Bone    Wt Readings from Last 3 Encounters:  06/14/23 57.8 kg (127 lb 6.4 oz)  06/04/23 58.7 kg (129 lb 6.4 oz)  05/20/23 57 kg (125 lb 9.6 oz)   BP 136/80   Pulse 100   Wt 57.8 kg (127 lb 6.4 oz)   LMP 04/27/2013 Comment: Perimenapausal   SpO2 96%   BMI 24.07 kg/m   PHYSICAL EXAM: General:  NAD. No resp difficulty HEENT: Normal Neck: Supple. No JVD. Carotids 2+ bilat; no bruits. No lymphadenopathy or thryomegaly appreciated. Cor: PMI nondisplaced. Regular rate & rhythm. No rubs, gallops or murmurs. Lungs: Clear Abdomen: Soft, nontender, nondistended. No hepatosplenomegaly. No bruits or masses. Good bowel sounds. Extremities: No cyanosis, clubbing, rash, edema Neuro: Alert & oriented x 3, cranial nerves grossly intact. Moves all 4 extremities w/o difficulty. Affect pleasant.  ASSESSMENT & PLAN: 1. Chronic systolic HF/chest pressure - Echo (4/21): EF 35-40% global HK  grade II DD. Unclear etiology suspect HTN +/- ETOH - R/LHC  (5/21): normal cors, EF 40-45% RHC ok - cMRI (10/21): LVEF 50% w/ diffuse mid myocardial gadolinium uptake non-specific can be seen in non ischemic DCM. RV normal.  - Echo (4/23): EF 55-60%, G1DD, normal RV - Echo (8/24): EF 45-50%, RV ok - NYHA II. Volume OK today. - Restart Entresto 24/26 mg bid. - Continue spironolactone 25 mg daily  - Continue carvedilol 12.5 mg bid  - Add SGLT2i next visit - we again  discussed need for ETOH cessation. Advised to reduce consumption  - Recent labs reviewed, repeat BMET in 2 weeks  2. Severe HTN - BP up a bit - Restart Entresto as above  3. ETOH use - Discussed possibility that this could be cause of reduced EF - Discussed cessation  4. Tobacco use - Still smoking ~ 1/2 ppd  - Encouraged cessation  5. HLD - Recent LDL 120, --> started on statin by PCP  - Repeat lipids/LFTs next visit  Follow up in 3-4 weeks with PharmD (add SGLT2i, check CMET and lipids), and 4 months with Dr. Gala Romney.  FMLA paperwork filled out today for patient's daughter  Jacklynn Ganong, FNP  9:16 AM

## 2023-06-14 ENCOUNTER — Encounter (HOSPITAL_COMMUNITY): Payer: Self-pay

## 2023-06-14 ENCOUNTER — Other Ambulatory Visit (HOSPITAL_COMMUNITY): Payer: Self-pay

## 2023-06-14 ENCOUNTER — Ambulatory Visit (HOSPITAL_COMMUNITY)
Admission: RE | Admit: 2023-06-14 | Discharge: 2023-06-14 | Disposition: A | Discharge status: Home / Self Care | Payer: Medicaid Other | Source: Ambulatory Visit | Attending: Family Medicine | Admitting: Family Medicine

## 2023-06-14 VITALS — BP 136/80 | HR 100 | Wt 127.4 lb

## 2023-06-14 DIAGNOSIS — K219 Gastro-esophageal reflux disease without esophagitis: Secondary | ICD-10-CM | POA: Diagnosis not present

## 2023-06-14 DIAGNOSIS — I5022 Chronic systolic (congestive) heart failure: Secondary | ICD-10-CM | POA: Diagnosis present

## 2023-06-14 DIAGNOSIS — I110 Hypertensive heart disease with heart failure: Secondary | ICD-10-CM | POA: Diagnosis not present

## 2023-06-14 DIAGNOSIS — E785 Hyperlipidemia, unspecified: Secondary | ICD-10-CM | POA: Insufficient documentation

## 2023-06-14 DIAGNOSIS — F1090 Alcohol use, unspecified, uncomplicated: Secondary | ICD-10-CM | POA: Diagnosis not present

## 2023-06-14 DIAGNOSIS — I10 Essential (primary) hypertension: Secondary | ICD-10-CM

## 2023-06-14 DIAGNOSIS — Z720 Tobacco use: Secondary | ICD-10-CM | POA: Diagnosis not present

## 2023-06-14 DIAGNOSIS — F17210 Nicotine dependence, cigarettes, uncomplicated: Secondary | ICD-10-CM | POA: Insufficient documentation

## 2023-06-14 DIAGNOSIS — F1010 Alcohol abuse, uncomplicated: Secondary | ICD-10-CM | POA: Diagnosis not present

## 2023-06-14 MED ORDER — ENTRESTO 24-26 MG PO TABS: 1.0000 | ORAL_TABLET | Freq: Two times a day (BID) | ORAL | 3 refills | Status: DC

## 2023-06-14 NOTE — Patient Instructions (Signed)
Medication Changes:  RESTART: ENTRESTO 24/26 TWICE DAILY- PLEASE GIVE YOUR PHARMACY YOUR MEDICAID INSURANCE INFORMATION FOR THIS  Follow-Up in: 3-4 WEEKS AS SCHEDULED WITH PHARMACY   THEN 4 MONTHS WITH DR. Gala Romney PLEASE CALL OUR OFFICE AROUND NOVEMBER TO GET SCHEDULED FOR YOUR APPOINTMENT. PHONE NUMBER IS [phone] OPTION 2   At the [facility], you and your health needs are our priority. We have a designated team specialized in the treatment of Heart Failure. This Care Team includes your primary Heart Failure Specialized Cardiologist (physician), Advanced Practice Providers (APPs- Physician Assistants and Nurse Practitioners), and Pharmacist who all work together to provide you with the care you need, when you need it.   You may see any of the following providers on your designated Care Team at your next follow up:  Dr. Arvilla Meres Dr. Marca Ancona Dr. Marcos Eke, NP Robbie Lis, [place] [facility] [place], [place] Brynda Peon, NP Karle Plumber, PharmD   Please be sure to bring in all your medications bottles to every appointment.   Need to Contact us:  If you have any questions or concerns before your next appointment please send [place] a message through [place] or call our office at [phone].    TO LEAVE A MESSAGE FOR THE NURSE SELECT OPTION 2, PLEASE LEAVE A MESSAGE INCLUDING: YOUR NAME DATE OF BIRTH CALL BACK NUMBER REASON FOR CALL**this is important as we prioritize the call backs  YOU WILL RECEIVE A CALL BACK THE SAME DAY AS LONG AS YOU CALL BEFORE 4:00 PM

## 2023-06-18 ENCOUNTER — Inpatient Hospital Stay: Admission: RE | Admit: 2023-06-18 | Payer: Medicaid Other | Source: Ambulatory Visit

## 2023-06-21 ENCOUNTER — Ambulatory Visit (INDEPENDENT_AMBULATORY_CARE_PROVIDER_SITE_OTHER): Payer: Medicaid Other | Admitting: Student

## 2023-06-21 ENCOUNTER — Ambulatory Visit (HOSPITAL_COMMUNITY)
Admission: RE | Admit: 2023-06-21 | Discharge: 2023-06-21 | Disposition: A | Discharge status: Home / Self Care | Payer: Medicaid Other | Source: Ambulatory Visit | Attending: Family Medicine | Admitting: Family Medicine

## 2023-06-21 VITALS — BP 144/73 | HR 75 | Ht 61.0 in | Wt 130.0 lb

## 2023-06-21 DIAGNOSIS — R0789 Other chest pain: Secondary | ICD-10-CM | POA: Diagnosis not present

## 2023-06-21 DIAGNOSIS — R053 Chronic cough: Secondary | ICD-10-CM

## 2023-06-21 DIAGNOSIS — Z720 Tobacco use: Secondary | ICD-10-CM

## 2023-06-21 MED ORDER — ALBUTEROL SULFATE HFA 108 (90 BASE) MCG/ACT IN AERS: 2.0000 | INHALATION_SPRAY | Freq: Four times a day (QID) | RESPIRATORY_TRACT | 2 refills | Status: DC | PRN

## 2023-06-21 NOTE — Progress Notes (Signed)
    SUBJECTIVE:   CHIEF COMPLAINT / HPI:   HTN Patient reports she is unable to obtain Entresto due to cost.  She reports he spoke with her cardiologist, and they resent Entresto prescription.  She has Medicaid, and this medication should be covered-advised her to discuss with pharmacist.  Happy to send to different pharmacy if necessary.  She is compliant with spironolactone and carvedilol, and without side effects.  Cough  chest pain  tobacco use Patient states that she has been having increased cough.  Cough is been occurring for many months.  She still smokes 6 to 7 cigarettes daily.  She was unable to obtain low-dose CT scan this past Friday due to weather.  Chest pain only occurs with exertion.  She has mild chest pain in the office today which began after she had to " rush over here" after originally presented to the wrong office. Pain is located in the center of her chest, tightness but not crushing not worsened with breathing, does not hurt when she pushes on it, denies trauma, is associated with cough.   OBJECTIVE:   BP (!) 140/67   Pulse 75   Ht 5\' 1"  (1.549 m)   Wt 130 lb (59 kg)   LMP 04/27/2013 Comment: Perimenapausal   SpO2 100%   BMI 24.56 kg/m    General: NAD, pleasant Cardio: RRR, no MRG. Cap Refill <2s. Respiratory: CTAB, normal wob on RA GI: Abdomen is soft, not tender, not distended. BS present Skin: Warm and dry  ASSESSMENT/PLAN:   Assessment & Plan Chronic cough Acute on chronic cough. Overall well-appearing, lungs CTAB and normal work breathing on room air, mildly prolonged expiratory phase.  No allergic symptoms.  No systemic symptoms.  Worsens with exertion and being outside.  Differential: COPD versus asthma, bronchitis, viral URI. -Trial albuterol -Consider pulmonary function tests, antihistamines -Follow-up in 2 to 3 weeks Other chest pain Tightness in center of chest after exertion.  Resolved with rest.  Well-appearing in the office, benign  physical exam, patient talking without shortness of breath.  EKG obtained in office: Normal sinus rhythm, no evidence of ischemia.  May be related to cough (see above). Differential: Stable angina, bronchitis, COPD. Low concern for MI, PE, Pneumonia, cardiomyopathy.  -Strict ED precautions provided Tobacco use Smoking cessation counseling. Plan to reduce smoking 50% by December 15. -Needs to reschedule low-dose CT scan    Tiffany Kocher, DO [facility]

## 2023-06-21 NOTE — Patient Instructions (Signed)
It was great to see you! Thank you for allowing me to participate in your care!   I recommend that you always bring your medications to each appointment as this makes it easy to ensure we are on the correct medications and helps [place] not miss when refills are needed.  Our plans for today:  - Take 2 puffs of albuterol when you feel your chest is tight and you cough - Please pick-up Entresto and start taking this medication - By December 15, cut back to 3 cigarettes per day    Take care and seek immediate care sooner if you develop any concerns. Please remember to show up 15 minutes before your scheduled appointment time!  Tiffany Kocher, DO [facility]

## 2023-06-21 NOTE — Assessment & Plan Note (Signed)
Tightness in center of chest after exertion.  Resolved with rest.  Well-appearing in the office, benign physical exam, patient talking without shortness of breath.  EKG obtained in office: Normal sinus rhythm, no evidence of ischemia.  May be related to cough (see above). Differential: Stable angina, bronchitis, COPD. Low concern for MI, PE, Pneumonia, cardiomyopathy.  -Strict ED precautions provided

## 2023-06-29 ENCOUNTER — Other Ambulatory Visit (HOSPITAL_COMMUNITY): Payer: Self-pay

## 2023-07-02 NOTE — Progress Notes (Incomplete)
***In Progress***    Advanced Heart Failure Clinic Note  Primary Care: Dr. Shirlean Mylar [facility])  Primary Cardiologist: Dr. Eden Emms (not seen since 2016) HF Cardiologist: Dr. Gala Romney   HPI:  Angela Serrano is a 62 y.o. woman with HTN, GERD with esophageal stricture s/p dilation in 2016 and tobacco use referred by Dr. Leary Roca for further evaluation of her HF.   Saw Dr. Eden Emms in 2016 for moderate pericardial effusion found incidentally on chest CT. TEE 09/2014 EF 55-60% and normal valves. Treated with NSAIDs for pleuropericarditis.    Echo 12/2019 EF 35-40% global HK grade II DD.    We saw her for the first time 01/2020. Was not feeling well. NYHA III. Underwent R/L cath: EF 40-45% no CAD. Normal RHC.    cMRI 07/02/20 which showed EF up to 50% w/ diffuse mid myocardial gadolinium uptake non-specific can be seen in non ischemic DCM. RV normal.    Echo 12/2021 showed EF 55-60%, G1DD, normal RV   Follow up 05/20/23, last seen 10/2020. Off most GDMT   Echo 04/2023 showed EF 45-50%.   She returned for HF follow up with APP on 06/14/23. Overall felt fine. She had SOB walking up steps. A couple episodes of chest "stinging" occurred when she was upset, resolved spontaneously. Occasional palpitations. Denied dizziness, edema, or PND/Orthopnea. Appetite ok. No fever or chills. Weight at home 126-127 pounds. Was taking all medications but not Entresto (cost prohibitive). She had a stressful year, quit her job in 11/2022. She's smoking 6-8 cig/day, 3-5 tall boy/beers a day, no drugs. She mentioned living with her daughter and working as a Conservation officer, nature at [employer].   Cardiac Studies - Echo (04/2023): EF 45-50%, G1DD, normal RV   - Echo (12/2021): EF 55-60%, G1DD, normal RV   - cMRI (06/2020): LVEF 50% with diffuse HK in apex, normal RV, tri leaflet AV with mild appearing AR, non-specific diffuse mid myocardia LGE, can be seen in non-ischemic DCM   - R/LHC (01/2020): normal cors, EF 40-45%, normal hemodynamics Ao =  143/76 (103) LV = 132/4 RA = 2 RV = 26/4 PA = 26/8 (17) PCW = 6 Fick cardiac output/index = 3.2/2.4 PVR = 2.9 WU Ao sat = 98% PA sat = 69%, 69%  Today she returns to HF clinic for pharmacist medication titration. At last visit with APP, Entresto 24/26 mg BID was restarted.    Overall feeling ***. Dizziness, lightheadedness, fatigue:  Chest pain or palpitations:  How is your breathing?: *** SOB: Able to complete all ADLs. Activity level ***  Weight at home pounds. Takes furosemide/torsemide/bumex *** mg *** daily.  LEE PND/Orthopnea  Appetite *** Low-salt diet:   Physical Exam Cost/affordability of meds   HF Medications: Carvedilol 12.5 mg BID Entresto 24/26 mg BID Spironolactone 25 mg daily  Has the patient been experiencing any side effects to the medications prescribed?  {YES NO:22349}  Does the patient have any problems obtaining medications due to transportation or finances?   {YES NO:22349}  Understanding of regimen: {excellent/good/fair/poor:19665} Understanding of indications: {excellent/good/fair/poor:19665} Potential of compliance: {excellent/good/fair/poor:19665} Patient understands to avoid NSAIDs. Patient understands to avoid decongestants.    Pertinent Lab Values: 05/20/23 Serum creatinine 0.67, BUN 9, Potassium 4.1, Sodium 139, BNP 10.1  Vital Signs: Weight: *** (last clinic weight: 127 lbs) Blood pressure: ***  Heart rate: ***   Assessment/Plan: 1. Chronic systolic HF/chest pressure - Echo (12/2019): EF 35-40% global HK grade II DD. Unclear etiology suspect HTN +/- ETOH - R/LHC  (01/2020): normal  cors, EF 40-45% RHC ok - cMRI (06/2020): LVEF 50% w/ diffuse mid myocardial gadolinium uptake non-specific can be seen in non ischemic DCM. RV normal.  - Echo (12/2021): EF 55-60%, G1DD, normal RV - Echo (04/2023): EF 45-50%, RV ok - NYHA II. Volume OK today. - Continue carvedilol 12.5 mg BID  - Restart Entresto 24/26 mg BID. - Continue  spironolactone 25 mg daily  - Add SGLT2i next visit - we again discussed need for ETOH cessation. Advised to reduce consumption  - Recent labs reviewed, repeat BMET in 2 weeks   2. Severe HTN - BP up a bit - Restart Entresto as above   3. ETOH use - Discussed possibility that this could be cause of reduced EF - Discussed cessation   4. Tobacco use - Still smoking ~ 1/2 ppd  - Encouraged cessation - Goal is to cut back to 3 cigarettes per day by December 15th per Dr. Claudean Severance   5. HLD - Recent LDL 120 - Continue atorvastatin 40 mg daily - Repeat lipids/LFTs next visit   Follow up in 3-4 weeks with PharmD (add SGLT2i, check CMET and lipids), and 4 months with Dr. Gala Romney.   FMLA paperwork filled out today for patient's daughter      Follow up ***   Karle Plumber, PharmD, BCPS, BCCP, CPP Heart Failure Clinic Pharmacist [phone]

## 2023-07-06 ENCOUNTER — Telehealth (HOSPITAL_COMMUNITY): Payer: Self-pay

## 2023-07-06 ENCOUNTER — Other Ambulatory Visit (HOSPITAL_COMMUNITY): Payer: Self-pay

## 2023-07-06 ENCOUNTER — Ambulatory Visit (HOSPITAL_COMMUNITY)
Admission: RE | Admit: 2023-07-06 | Discharge: 2023-07-06 | Disposition: A | Discharge status: Home / Self Care | Payer: Medicaid Other | Source: Ambulatory Visit | Attending: Cardiology | Admitting: Cardiology

## 2023-07-06 ENCOUNTER — Other Ambulatory Visit (HOSPITAL_COMMUNITY): Payer: Self-pay | Admitting: Pharmacist

## 2023-07-06 VITALS — BP 130/76 | HR 85 | Wt 127.6 lb

## 2023-07-06 DIAGNOSIS — F17210 Nicotine dependence, cigarettes, uncomplicated: Secondary | ICD-10-CM | POA: Insufficient documentation

## 2023-07-06 DIAGNOSIS — E7800 Pure hypercholesterolemia, unspecified: Secondary | ICD-10-CM

## 2023-07-06 DIAGNOSIS — E785 Hyperlipidemia, unspecified: Secondary | ICD-10-CM | POA: Diagnosis not present

## 2023-07-06 DIAGNOSIS — Z7984 Long term (current) use of oral hypoglycemic drugs: Secondary | ICD-10-CM | POA: Insufficient documentation

## 2023-07-06 DIAGNOSIS — Z79899 Other long term (current) drug therapy: Secondary | ICD-10-CM | POA: Diagnosis not present

## 2023-07-06 DIAGNOSIS — R0789 Other chest pain: Secondary | ICD-10-CM | POA: Insufficient documentation

## 2023-07-06 DIAGNOSIS — I110 Hypertensive heart disease with heart failure: Secondary | ICD-10-CM | POA: Diagnosis not present

## 2023-07-06 DIAGNOSIS — K219 Gastro-esophageal reflux disease without esophagitis: Secondary | ICD-10-CM | POA: Diagnosis not present

## 2023-07-06 DIAGNOSIS — I5022 Chronic systolic (congestive) heart failure: Secondary | ICD-10-CM | POA: Insufficient documentation

## 2023-07-06 LAB — COMPREHENSIVE METABOLIC PANEL
ALT: 15 U/L (ref 0–44)
AST: 19 U/L (ref 15–41)
Albumin: 3.9 g/dL (ref 3.5–5.0)
Alkaline Phosphatase: 88 U/L (ref 38–126)
Anion gap: 10 (ref 5–15)
BUN: 11 mg/dL (ref 6–20)
CO2: 23 mmol/L (ref 22–32)
Calcium: 9.5 mg/dL (ref 8.9–10.3)
Chloride: 105 mmol/L (ref 98–111)
Creatinine, Ser: 0.72 mg/dL (ref 0.44–1.00)
GFR, Estimated: >60 mL/min (ref >60)
Glucose, Bld: 110 mg/dL — ABNORMAL HIGH (ref 70–99)
Potassium: 4.6 mmol/L (ref 3.5–5.1)
Sodium: 138 mmol/L (ref 135–145)
Total Bilirubin: 0.6 mg/dL (ref 0.3–1.2)
Total Protein: 7.1 g/dL (ref 6.5–8.1)

## 2023-07-06 LAB — LIPID PANEL
Cholesterol: 234 mg/dL — ABNORMAL HIGH (ref 0–200)
HDL: 97 mg/dL (ref >40)
LDL Cholesterol: 124 mg/dL — ABNORMAL HIGH (ref 0–99)
Total CHOL/HDL Ratio: 2.4 {ratio}
Triglycerides: 64 mg/dL (ref <150)
VLDL: 13 mg/dL (ref 0–40)

## 2023-07-06 MED ORDER — FARXIGA 10 MG PO TABS: 10.0000 mg | ORAL_TABLET | Freq: Every day | ORAL | 11 refills | Status: DC

## 2023-07-06 MED ORDER — ATORVASTATIN CALCIUM 40 MG PO TABS: 40.0000 mg | ORAL_TABLET | Freq: Every day | ORAL | 5 refills | Status: DC

## 2023-07-06 NOTE — Patient Instructions (Signed)
It was a pleasure seeing you today!  MEDICATIONS: -We are changing your medications today -Start Entresto 24/26 mg (1 tablet) twice daily -Start Farxiga 10 mg (1 tablet) daily -Call if you have questions about your medications.  LABS: -We will call you if your labs need attention.  NEXT APPOINTMENT: Return to clinic in 6 weeks with Pharmacy Clinic.  In general, to take care of your heart failure: -Limit your fluid intake to 2 Liters (half-gallon) per day.   -Limit your salt intake to ideally 2-3 grams (2000-3000 mg) per day. -Weigh yourself daily and record, and bring that "weight diary" to your next appointment.  (Weight gain of 2-3 pounds in 1 day typically means fluid weight.) -The medications for your heart are to help your heart and help you live longer.   -Please contact us before stopping any of your heart medications.  Call the clinic at [phone] with questions or to reschedule future appointments.

## 2023-07-06 NOTE — Telephone Encounter (Signed)
Advanced Heart Failure Patient Advocate Encounter  Prior authorization for Marcelline Deist has been submitted and approved. Test billing returns refill too soon rejection, unable to confirm copay at this time.  Key: [id number] Effective: 07/06/2023 to 07/05/2024  Burnell Blanks, CPhT Rx Patient Advocate Phone: [phone]

## 2023-07-06 NOTE — Progress Notes (Signed)
Advanced Heart Failure Clinic Note   Primary Care: Dr. Shirlean Mylar [facility])  Primary Cardiologist: Dr. Eden Emms (not seen since 2016) HF Cardiologist: Dr. Gala Romney   HPI:  Ms. Edinger is a 62 y.o. woman with HTN, GERD with esophageal stricture s/p dilation in 2016 and tobacco use referred by Dr. Leary Roca for further evaluation of her HF.   Saw Dr. Eden Emms in 2016 for moderate pericardial effusion found incidentally on chest CT. TEE 09/2014 EF 55-60% and normal valves. Treated with NSAIDs for pleuropericarditis.    Echo 12/2019 EF 35-40% global HK grade II DD.    We saw her for the first time 01/2020. Was not feeling well. NYHA III. Underwent R/L cath: EF 40-45% no CAD. Normal RHC.    cMRI 07/02/20 which showed EF up to 50% w/ diffuse mid myocardial gadolinium uptake non-specific can be seen in non ischemic DCM. RV normal.    Echo 12/2021 showed EF 55-60%, G1DD, normal RV   Follow up 05/20/23, last seen 10/2020. Off most GDMT.   Echo 04/2023 showed EF 45-50%.   She returned for HF follow up with APP on 06/14/23. Overall felt fine. She had SOB walking up steps. A couple episodes of chest "stinging" occurred when she was upset, resolved spontaneously. Occasional palpitations. Denied dizziness, edema, or PND/Orthopnea. Appetite ok. No fever or chills. Weight at home 126-127 pounds. Was taking all medications but not Entresto (cost prohibitive). She had a stressful year, quit her job in 11/2022. She's smoking 6-8 cig/day, 3-5 tall boy/beers a day, no drugs. She mentioned living with her daughter and working as a Conservation officer, nature at [employer].    Today she returns to HF clinic for pharmacist medication titration. At last visit with APP, Entresto 24/26 mg BID was restarted. Unfortunately, she was not able to restart this due to problems with insurance. However, she called [facility] Medicaid this morning and they spoke with [facility] and were able to allow the prescription to process for $4. She is planning to pick this up  after the visit. Overall feeling good today. She has more energy on certain days than on others. No dizziness or lightheadedness. Experiences fatigue and shortness of breath after climbing two flights of stairs. Reports a normal EKG despite experiencing chest pain during the last visit with MD. Has occasional CP that feels like a "pinpoint" sensation when she is outside in the sun, reports this lasts for about 5 minutes before subsiding. No palpitations. She can walk long distances on flat ground without any problems if she walks at a slow pace. Occasionally weighs herself at home, typically between 127-130 lbs. No LEE, PND or orthopnea. Appetite is good and has improved since last visit. Follows a low-salt diet.   HF Medications: Carvedilol 12.5 mg BID Entresto 24/26 mg BID - not taking Spironolactone 25 mg daily  Has the patient been experiencing any side effects to the medications prescribed? No  Does the patient have any problems obtaining medications due to transportation or finances? No; resolved - patient contacted [facility] Medicaid this morning who then contacted [facility] and was able to get [facility] covered.  Understanding of regimen: good Understanding of indications: good Potential of compliance: good Patient understands to avoid NSAIDs. Patient understands to avoid decongestants.    Pertinent Lab Values: 05/20/23 Serum creatinine 0.67, BUN 9, Potassium 4.1, Sodium 139, BNP 10.1 CMET today pending  Vital Signs: Weight: 127.6 lbs (last clinic weight: 127 lbs) Blood pressure: 130/76 mmHg  Heart rate: 85 bpm  Assessment/Plan: 1. Chronic systolic HF/chest pressure - Echo (12/2019): EF 35-40% global HK grade II DD. Unclear etiology suspect HTN +/- ETOH - R/LHC  (01/2020): normal cors, EF 40-45% RHC ok - cMRI (06/2020): LVEF 50% w/ diffuse mid myocardial gadolinium uptake non-specific can be seen in non ischemic DCM. RV normal.  - Echo (12/2021): EF 55-60%, G1DD, normal RV - Echo (04/2023):  EF 45-50%, RV ok - NYHA II. Euvolemic on exam. -CMET today pending - Continue carvedilol 12.5 mg BID  - Restart Entresto 24/26 mg BID. She will pick up from pharmacy today.  - Continue spironolactone 25 mg daily  - Start Farxiga 10 mg daily - Previously discussed need for ETOH cessation. Was advised to reduce consumption.   2. Severe HTN - BP 130/78 mmHg - Restart Entresto as above   3. ETOH use - Previously discussed possibility that this could be cause of reduced EF - Cessation was discussed   4. Tobacco use - Was smoking ~ 1/2 ppd  - Previously encouraged cessation - Goal is to cut back to 3 cigarettes per day by December 15th per Dr. Claudean Severance   5. HLD - Recent LDL 120 (05/04/23); repeat lipid panel and CMET today - Continue atorvastatin 40 mg daily    Follow up in 4 weeks at pharmacy clinic.   Karle Plumber, PharmD, BCPS, BCCP, CPP Heart Failure Clinic Pharmacist [phone]

## 2023-07-16 ENCOUNTER — Encounter: Payer: Self-pay | Admitting: Student

## 2023-07-16 ENCOUNTER — Ambulatory Visit (INDEPENDENT_AMBULATORY_CARE_PROVIDER_SITE_OTHER): Payer: Medicaid Other | Admitting: Student

## 2023-07-16 VITALS — BP 124/78 | HR 72 | Ht 61.5 in | Wt 127.8 lb

## 2023-07-16 DIAGNOSIS — F17200 Nicotine dependence, unspecified, uncomplicated: Secondary | ICD-10-CM

## 2023-07-16 DIAGNOSIS — I10 Essential (primary) hypertension: Secondary | ICD-10-CM | POA: Diagnosis present

## 2023-07-16 DIAGNOSIS — R053 Chronic cough: Secondary | ICD-10-CM

## 2023-07-16 DIAGNOSIS — Z23 Encounter for immunization: Secondary | ICD-10-CM

## 2023-07-16 MED ORDER — NICOTINE 7 MG/24HR TD PT24: 7.0000 mg | MEDICATED_PATCH | Freq: Every day | TRANSDERMAL | 1 refills | Status: DC

## 2023-07-16 NOTE — Assessment & Plan Note (Signed)
Patient interested in cessation.  Would like to try patches.  Would also like to see Dr. Raymondo Band. -7 mg patches every 24 hours - Other resources provided - Referral sent to pharmacy

## 2023-07-16 NOTE — Patient Instructions (Addendum)
It was great to see you! Thank you for allowing me to participate in your care!   I recommend that you always bring your medications to each appointment as this makes it easy to ensure we are on the correct medications and helps [place] not miss when refills are needed.  Our plans for today:  - Please reschedule your low dose CT scan - I have sent Nicotine patches to your pharmacy. They are worn all day, and should be replaced every 24 hours. Please do not smoke while wearing your patches. - I have sent a referral to Dr. Raymondo Band for smoking cessation counseling   Tobacco Patient Instructions  Quitting smoking is one of the most important decisions you can make for your current and future health. Consider what you dislike about smoking and how quitting could personally benefit you. Try to cut down. Aim for reducing the amount you smoke by 50% over the next 4 weeks.  My target quit date is: 09/05/2023  Starting today, Be a Quitter!  Remind yourself why you want to quit.  Delay your first cigarette of the day for as long as possible.  Start cleaning out all pockets, drawers, and your car of cigarettes.  Getting Through the Cravings Once You Are Smoke Free: Each craving will last about 10 minutes, whether or not you smoke. Here's how to get through the cravings without cigarettes:  DELAY: Tell yourself that you'll wait for the next craving. Do it every time! DEEP BREATHS: One reason smoking feels good is because you breathe in deeply to inhale. Take four slow, deep breaths and feel the relaxation without the hamful effects of cigarettes. DRINK WATER: Drink a glass of cool water. It will give your hands and mouth something to do and will help flush the nicotine out of your system faster. DIVERT: Do something else -- brush your teeth, take a walk, call a friend who can offer you support. Just moving onto something other than thinking about cigarettes will move you through the craving.   Frequently  Asked Questions  What can I do when I get the urge to smoke? To get through the urge to smoke, try the following:  Review your reasons for quitting and think of all the benefits to your health, your finances, and your family.  Remind yourself that there is no such thing as just one cigarette -- or even one puff.  Ride out the desire to smoke. Use the 4 Os -- Delay, Deep Breaths, Drink Water and Divert to get you through. The craving will go away eventually. Do not fool yourself into thinking you can have just one cigarette.  Any tips on how to deal with stress? Stress is a natural part of life. The key is to deal with it without reaching for a cigarette. Taking deep breaths, counting backwards from 10 and asking yourself 1-how big a deal is this?"  Writing down your feelings, talking with a friend and doing things like positive self-talk and meditation are some other ways that people deal with daily stress.  What if I start smoking again? Slips happen. Most people try to quit smoking a few times before they are successful. Don't beat yourself up if this happens to you! Ask yourself if this was a slip or a relapse. A slip is a one-time mistake that is quickly corrected. A relapse is going back to your old smoking habits.   If you slip, don't give up. Think of it as a learning  experience. Ask yourself what went wrong and renew your commitment to staying away from smoking for good.  If you relapse, try not to get discouraged. Ask yourself the question "What caused me to start smoking?" Figure out what helped you and what didn't when you tried to quit. Knowing why you relapsed is useful information for your next attempt to quit.   You can call 1 800 QUIT NOW [phone])---you will be connected with a Careers information officer. They can also mail you nicotine gums, lozenges, and patches to quit.    Take care and seek immediate care sooner if you develop any concerns. Please remember to show up 15  minutes before your scheduled appointment time!  Tiffany Kocher, DO [facility]

## 2023-07-16 NOTE — Progress Notes (Signed)
    SUBJECTIVE:   CHIEF COMPLAINT / HPI:   Chronic cough Resolved.  Has not been using the albuterol inhaler.  HTN  CHF Patient presents for follow-up of hypertension.  Patient is back on Entresto.  Her blood pressures have been in goal.  She is asymptomatic today.  Tobacco use Patient reports difficulty cutting back after her last appointment.  Still smoking 6/7 cigarettes.  He is interested in smoking cessation counseling.   OBJECTIVE:   BP 124/78   Pulse 72   Ht 5' 1.5" (1.562 m)   Wt 127 lb 12.8 oz (58 kg)   LMP 04/27/2013 Comment: Perimenapausal   SpO2 98%   BMI 23.76 kg/m    General: NAD, pleasant Cardio: RRR, no MRG.  Respiratory: CTAB, normal wob on RA Skin: Warm and dry  ASSESSMENT/PLAN:   Assessment & Plan Primary hypertension In goal today.  Asymptomatic. - Continue Entresto, Aldactone, Coreg Chronic cough Resolved.  May have component of allergic rhinitis.  Discussed OTC antihistamines as needed.  Defer further workup at this time. Tobacco use disorder Patient interested in cessation.  Would like to try patches.  Would also like to see Dr. Raymondo Band. -7 mg patches every 24 hours - Other resources provided - Referral sent to pharmacy Encounter for immunization Flu and Tdap today.  Tiffany Kocher, DO [facility]

## 2023-08-02 ENCOUNTER — Telehealth: Payer: Self-pay | Admitting: Pharmacist

## 2023-08-02 NOTE — Telephone Encounter (Signed)
Patient contacted for follow-up of need for tobacco cessation - Rx Clinic.   Scheduled for visit 11/14 at 9:00 AM Rx - Clinic for tobacco cessation.   Total time with patient call and documentation of interaction: 11 minutes.

## 2023-08-02 NOTE — Telephone Encounter (Signed)
-----   Message from Maricao K sent at 07/23/2023  9:00 AM EDT ----- Regarding: RE: Referral It looks like he is already scheduled for smoke cessation somewhere else? ----- Message ----- From: Kathrin Ruddy, RPH-CPP Sent: 07/21/2023   7:49 AM EDT To: Bevelyn Ngo Admin Subject: FW: Referral                                   Please contact and schedule into Pharmacy Clinic (tobacco intake reduction/cessation) ----- Message ----- From: Tiffany Kocher, DO Sent: 07/16/2023  11:37 AM EDT To: Kathrin Ruddy, RPH-CPP Subject: Referral                                       Hey Dr. Raymondo Band,  She would like to see you for smoking cessation.  She has tried Wellbutrin in the past without success.  Today we are trying patches.  Currently smoking 6 to 7 cigarettes daily.  Started her on the 7 mg patches, counseled her on use and provided other quit resources.  Her desired quit date is September 03, 2023.

## 2023-08-04 ENCOUNTER — Other Ambulatory Visit (HOSPITAL_COMMUNITY): Payer: Self-pay

## 2023-08-04 NOTE — Progress Notes (Signed)
Advanced Heart Failure Clinic Note   Primary Care: Dr. Shirlean Mylar [facility])  Primary Cardiologist: Dr. Eden Emms (not seen since 2016) HF Cardiologist: Dr. Gala Romney   HPI:  Angela Serrano is a 62 y.o. woman with HTN, GERD with esophageal stricture s/p dilation in 2016 and tobacco use referred by Dr. Leary Roca for further evaluation of her HF.   Saw Dr. Eden Emms in 2016 for moderate pericardial effusion found incidentally on chest CT. TEE 09/2014 EF 55-60% and normal valves. Treated with NSAIDs for pleuropericarditis.    Echo 12/2019 EF 35-40% global HK grade II DD.    We saw her for the first time 01/2020. Was not feeling well. NYHA III. Underwent R/L cath: EF 40-45% no CAD. Normal RHC.    cMRI 07/02/20 which showed EF up to 50% w/ diffuse mid myocardial gadolinium uptake non-specific can be seen in non ischemic DCM. RV normal.    Echo 12/2021 showed EF 55-60%, G1DD, normal RV   Follow up 05/20/23, last seen 10/2020. Off most GDMT.   Echo 04/2023 showed EF 45-50%.   She returned for HF follow up with APP on 06/14/23. Overall felt fine. She had SOB walking up steps. A couple episodes of chest "stinging" occurred when she was upset, resolved spontaneously. Occasional palpitations. Denied dizziness, edema, or PND/Orthopnea. Appetite ok. No fever or chills. Weight at home 126-127 pounds. Was taking all medications but not Entresto (cost prohibitive). She had a stressful year, quit her job in 11/2022. She's smoking 6-8 cig/day, 3-5 tall boy/beers a day, no drugs. She mentioned living with her daughter and working as a Conservation officer, nature at [employer].    Today she returns to HF clinic for pharmacist medication titration. At recent visits to HF Clinic, Entresto 24/26 mg BID was restarted and Farxiga 10 mg daily was initiated. She was able to start the [facility] but has not started the [place] yet. States she "just hasn't picked it up yet", was not aware of any cost or access barriers to starting. Overall she is feeling well  today. Does get SOB when walking ~1 block. This is stable from last visit. No dizziness, lightheadedness, CP or palpitations. She does not weigh herself at home. Weight is up 2 lbs from last clinic visit. She does not need a loop diuretic. No LEE, PND or orthopnea. Recently started Chantix for smoking cessation. Goal is to quit by December 14th, she is being helped by the pharmacist at the [facility]. Very motivated to quit, as her daughter is pregnant and due in February.     HF Medications: Carvedilol 12.5 mg BID Entresto 24/26 mg BID  Spironolactone 25 mg daily Farxiga 10 mg daily - has not started yet  Has the patient been experiencing any side effects to the medications prescribed? No  Does the patient have any problems obtaining medications due to transportation or finances? No; has [facility] Medicaid  Understanding of regimen: good Understanding of indications: good Potential of compliance: good Patient understands to avoid NSAIDs. Patient understands to avoid decongestants.    Pertinent Lab Values: 07/06/23: Serum creatinine 0.72, BUN 11, Potassium 4.6, Sodium 138 BMET today pending   Vital Signs: Weight: 130 lbs (last clinic weight: 127.6 lbs) Blood pressure: 118/76 mmHg  Heart rate: 79 bpm   Assessment/Plan: 1. Chronic systolic HF/chest pressure - Echo (12/2019): EF 35-40% global HK grade II DD. Unclear etiology suspect HTN +/- ETOH - R/LHC  (01/2020): normal cors, EF 40-45% RHC ok - cMRI (06/2020): LVEF 50% w/  diffuse mid myocardial gadolinium uptake non-specific can be seen in non ischemic DCM. RV normal.  - Echo (12/2021): EF 55-60%, G1DD, normal RV - Echo (04/2023): EF 45-50%, RV ok - NYHA II. Euvolemic on exam. - BMET today pending - Continue carvedilol 12.5 mg BID  - Continue Entresto 24/26 mg BID.  - Continue spironolactone 25 mg daily  - Start Farxiga 10 mg daily. Had not started yet after being prescribed last visit. She will let us know if there are  any barriers to filling at the pharmacy.  - Previously discussed need for ETOH cessation. Was advised to reduce consumption.   2. Severe HTN - BP controlled - Continue current regimen    3. ETOH use - Previously discussed possibility that this could be cause of reduced EF   4. Tobacco use - Was smoking ~ 1/2 ppd  - Previously encouraged cessation - She has started Chantix and is motivated to quit   5. HLD - Recent LDL 120 (05/04/23); repeat lipid panel on 07/06/23 with LDL 124. She had not been taking her atorvastatin and was instructed to restart. - Continue atorvastatin 40 mg daily. Repeat lipid panel 6-8 weeks    Follow up in 3 months with Dr. Elyse Jarvis, PharmD, BCPS, [facility], CPP Heart Failure Clinic Pharmacist [phone]

## 2023-08-05 ENCOUNTER — Ambulatory Visit: Payer: Medicaid Other | Admitting: Pharmacist

## 2023-08-05 ENCOUNTER — Encounter: Payer: Self-pay | Admitting: Pharmacist

## 2023-08-05 VITALS — BP 106/60 | HR 81 | Wt 125.6 lb

## 2023-08-05 DIAGNOSIS — Z23 Encounter for immunization: Secondary | ICD-10-CM

## 2023-08-05 DIAGNOSIS — F17200 Nicotine dependence, unspecified, uncomplicated: Secondary | ICD-10-CM | POA: Diagnosis not present

## 2023-08-05 MED ORDER — VARENICLINE TARTRATE 0.5 MG PO TABS: 0.5000 mg | ORAL_TABLET | Freq: Two times a day (BID) | ORAL | 2 refills | Status: DC

## 2023-08-05 NOTE — Progress Notes (Signed)
Reviewed and agree with Dr Koval's plan.   

## 2023-08-05 NOTE — Progress Notes (Signed)
S:     62 y.o. female who presents for hypertension evaluation, education, and management.   PMH is significant for HF, HTN, tobacco dependence.  Patient was referred and last seen by Primary Care Provider, Dr. Claudean Severance, on 07/16/23.   At last visit, she discussed her interest in smoking cessation and was instructed to utilize nicotine replacement 7 mg patches (did not start). She has tried bupropion in the past and did not like its side effects (depressed mood).  Today, patient arrives in good spirits and presents with her eldest daughter. Her daughter is expecting her first child in February and is very supportive of her quitting smoking.    Age when started using tobacco on a daily basis [age]. Most amount smoked: 12 cigarettes/day (< 15/day) Brand smoked [employer], [employer]. Number of cigarettes/day 10.  Estimated nicotine content per cigarette (mg) 1 mg.  Estimated nicotine intake per day 10 mg.   Denies waking to smoke at night, smokes 0 times per night.    Fagerstrom Score Question Scoring Patient Score  How soon after waking do you smoke your first cigarette? <5 mins (3) 5-30 mins (2) 31-60 mins (1) >60 mins (0) 2  Do you find it difficult NOT to smoke in places where you shouldn't? Yes(1) No (0)   Which cigarette would you most hate to give up? Cigarette before work First one in AM (1) Any other one (0) 0   How many cigarettes do you smoke/day? 10 or less (0) 11-20 (1) 21-30 (2) >30 (3) 1  Do you smoke more during the first few hours after waking? Yes (1) No (0)   Do you smoke if you are so ill you cannot get out of bed? Yes (1) No (0)     Total Score  3   Score interpretation: low 1-2, low-to-moderate 3-4, moderate 5-7, high >7  ROS: All systems negative.  Physical exam: normal appearance, thought content, judgment, behavior, mood. Breathing normal, self-reported episodes of wheezing, minimal use of albuterol.   Most recent quit attempt  08/19/2022. Longest time ever been tobacco free 2 days after hospital stay. Did not quit smoking with pregnancies but reduced nicotine intake significantly by using marijuana at the same time.  Medications used in past cessation efforts include:  -- NRT (7 mg patches in 2022, lozenges) -- Peppermint sticks -- bupropion: experienced side effects (depression)  Rates IMPORTANCE of quitting tobacco on 1-10 scale of 10. Rates CONFIDENCE of quitting tobacco on 1-10 scale of 8.   Most common triggers to use tobacco include drinking beer, going outside (less frequently with colder weather)  Motivation to quit: daughter is expecting her first child, want to see a difference in energy levels (ex: working in the yard), worried about cancer  Stress relief: working in the yard, watching HGTV, playing games, exercise   O: Clinical ASCVD: No  The 10-year ASCVD risk score (Arnett DK, et al., 2019) is: 10%   Values used to calculate the score:     Age: 62 years     Sex: Female     Is Non-Hispanic African American: Yes     Diabetic: No     Tobacco smoker: Yes     Systolic Blood Pressure: 124 mmHg     Is BP treated: Yes     HDL Cholesterol: 97 mg/dL     Total Cholesterol: 234 mg/dL  Patient is participating in a Managed Medicaid Plan:  Yes   A/P: Tobacco use disorder with moderate  nicotine dependence of 40 years duration in a patient who is good candidate for success because of future granddaughter joining the family in February 2025. She is "tired" of being dependent on smoking and is motivated to quit. -Initiated varenicline 0.5 mg by mouth once daily with food x7 days, then 0.5 mg by mouth twice daily with food thereafter. Patient counseled on purpose, proper use, and potential adverse effects, including GI upset. -- Plan to decrease smoking to 5 cigarettes/day in two weeks. -- Planned quit date: December 14th (absolute quit date by February 25th when daughter's baby is due)  -- Consider  spirometry at a future visit -Provided information on 1 800-QUIT NOW support program.   Covid vaccine requested and provided today.   Written patient instructions provided. Patient verbalized understanding of treatment plan.  Total time in face to face counseling 41 minutes.    Follow-up:  Pharmacist 08/26/23 PCP clinic visit PRN Patient seen with Lendon Ka, PharmD Candidate

## 2023-08-05 NOTE — Assessment & Plan Note (Signed)
Tobacco use disorder with moderate nicotine dependence of 40 years duration in a patient who is good candidate for success because of future granddaughter joining the family in February 2025. She is "tired" of being dependent on smoking and is motivated to quit. -Initiated varenicline 0.5 mg by mouth once daily with food x7 days, then 0.5 mg by mouth twice daily with food thereafter. Patient counseled on purpose, proper use, and potential adverse effects, including GI upset. -- Plan to decrease smoking to 5 cigarettes/day in two weeks. -- Planned quit date: December 14th (absolute quit date by February 25th when daughter's baby is due)  -- Consider spirometry at a future visit -Provided information on 1 800-QUIT NOW support program.

## 2023-08-05 NOTE — Patient Instructions (Signed)
Nice to see you today!  Medication Changes: START varenicline (Chantix) 0.5 mg ONCE daily for 7 days, then TWICE daily thereafter.  Continue all other medication the same.  Avoid triggers like smoking when angry or upset and when drinking alcohol.   Tobacco Patient Instructions  Quitting smoking is one of the most important decisions you can make for your current and future health. Consider what you dislike about smoking and how quitting could personally benefit you. Try to cut down. Aim for reducing the amount you smoke by 5 cigarettes/daily over the next 2 weeks.  My target quit date is: 09/04/23  Starting today, Be a Quitter!  Remind yourself why you want to quit.  Delay your first cigarette of the day for as long as possible.  Start cleaning out all pockets, drawers, and your car of cigarettes.  Getting Through the Cravings Once You Are Smoke Free: Each craving will last about 10 minutes, whether or not you smoke. Here's how to get through the cravings without cigarettes:  DELAY: Tell yourself that you'll wait for the next craving. Do it every time! DEEP BREATHS: One reason smoking feels good is because you breathe in deeply to inhale. Take four slow, deep breaths and feel the relaxation without the hamful effects of cigarettes. DRINK WATER: Drink a glass of cool water. It will give your hands and mouth something to do and will help flush the nicotine out of your system faster. DIVERT: Do something else -- brush your teeth, take a walk, call a friend who can offer you support. Just moving onto something other than thinking about cigarettes will move you through the craving.   Frequently Asked Questions  What can I do when I get the urge to smoke? To get through the urge to smoke, try the following:  Review your reasons for quitting and think of all the benefits to your health, your finances, and your family.  Remind yourself that there is no such thing as just one cigarette --  or even one puff.  Ride out the desire to smoke. Use the 4 Os -- Delay, Deep Breaths, Drink Water and Divert to get you through. The craving will go away eventually. Do not fool yourself into thinking you can have just one cigarette.  Any tips on how to deal with stress? Stress is a natural part of life. The key is to deal with it without reaching for a cigarette. Taking deep breaths, counting backwards from 10 and asking yourself 1-how big a deal is this?"  Writing down your feelings, talking with a friend and doing things like positive self-talk and meditation are some other ways that people deal with daily stress.  What if I start smoking again? Slips happen. Most people try to quit smoking a few times before they are successful. Don't beat yourself up if this happens to you! Ask yourself if this was a slip or a relapse. A slip is a one-time mistake that is quickly corrected. A relapse is going back to your old smoking habits.   If you slip, don't give up. Think of it as a learning experience. Ask yourself what went wrong and renew your commitment to staying away from smoking for good.  If you relapse, try not to get discouraged. Ask yourself the question "What caused me to start smoking?" Figure out what helped you and what didn't when you tried to quit. Knowing why you relapsed is useful information for your next attempt to quit.

## 2023-08-16 ENCOUNTER — Ambulatory Visit (HOSPITAL_COMMUNITY)
Admission: RE | Admit: 2023-08-16 | Discharge: 2023-08-16 | Disposition: A | Discharge status: Home / Self Care | Payer: Medicaid Other | Source: Ambulatory Visit | Attending: Internal Medicine | Admitting: Internal Medicine

## 2023-08-16 VITALS — BP 118/76 | HR 79 | Wt 130.0 lb

## 2023-08-16 DIAGNOSIS — Z720 Tobacco use: Secondary | ICD-10-CM | POA: Insufficient documentation

## 2023-08-16 DIAGNOSIS — F1090 Alcohol use, unspecified, uncomplicated: Secondary | ICD-10-CM | POA: Insufficient documentation

## 2023-08-16 DIAGNOSIS — E785 Hyperlipidemia, unspecified: Secondary | ICD-10-CM | POA: Diagnosis not present

## 2023-08-16 DIAGNOSIS — I110 Hypertensive heart disease with heart failure: Secondary | ICD-10-CM | POA: Diagnosis not present

## 2023-08-16 DIAGNOSIS — R0789 Other chest pain: Secondary | ICD-10-CM | POA: Insufficient documentation

## 2023-08-16 DIAGNOSIS — I5022 Chronic systolic (congestive) heart failure: Secondary | ICD-10-CM | POA: Diagnosis not present

## 2023-08-16 LAB — BASIC METABOLIC PANEL
Anion gap: 8 (ref 5–15)
BUN: 10 mg/dL (ref 6–20)
CO2: 22 mmol/L (ref 22–32)
Calcium: 9.1 mg/dL (ref 8.9–10.3)
Chloride: 113 mmol/L — ABNORMAL HIGH (ref 98–111)
Creatinine, Ser: 0.67 mg/dL (ref 0.44–1.00)
GFR, Estimated: >60 mL/min (ref >60)
Glucose, Bld: 98 mg/dL (ref 70–99)
Potassium: 4.2 mmol/L (ref 3.5–5.1)
Sodium: 143 mmol/L (ref 135–145)

## 2023-08-16 NOTE — Patient Instructions (Signed)
It was a pleasure seeing you today!  MEDICATIONS: -We are changing your medications today -Start Farxiga 10 mg (1 tablet) daily -Call if you have questions about your medications.  LABS: -We will call you if your labs need attention.  NEXT APPOINTMENT: Return to clinic in 3 months with Dr. Gala Romney. Please call the clinic at [phone] in December/January to schedule. .  In general, to take care of your heart failure: -Limit your fluid intake to 2 Liters (half-gallon) per day.   -Limit your salt intake to ideally 2-3 grams (2000-3000 mg) per day. -Weigh yourself daily and record, and bring that "weight diary" to your next appointment.  (Weight gain of 2-3 pounds in 1 day typically means fluid weight.) -The medications for your heart are to help your heart and help you live longer.   -Please contact us before stopping any of your heart medications.  Call the clinic at [phone] with questions or to reschedule future appointments.

## 2023-08-26 ENCOUNTER — Ambulatory Visit: Payer: Medicaid Other | Admitting: Pharmacist

## 2023-09-03 ENCOUNTER — Ambulatory Visit (INDEPENDENT_AMBULATORY_CARE_PROVIDER_SITE_OTHER): Payer: Medicaid Other | Admitting: Student

## 2023-09-03 VITALS — BP 124/65 | HR 80 | Ht 61.0 in | Wt 130.0 lb

## 2023-09-03 DIAGNOSIS — M79671 Pain in right foot: Secondary | ICD-10-CM | POA: Diagnosis present

## 2023-09-03 DIAGNOSIS — Z720 Tobacco use: Secondary | ICD-10-CM | POA: Diagnosis not present

## 2023-09-03 DIAGNOSIS — H539 Unspecified visual disturbance: Secondary | ICD-10-CM | POA: Diagnosis not present

## 2023-09-03 DIAGNOSIS — I10 Essential (primary) hypertension: Secondary | ICD-10-CM

## 2023-09-03 NOTE — Patient Instructions (Signed)
It was great to see you! Thank you for allowing me to participate in your care!   I recommend that you always bring your medications to each appointment as this makes it easy to ensure we are on the correct medications and helps [place] not miss when refills are needed.  Our plans for today:  - Please do the exercises below - I recommend you make a follow-up to see Dr. Raymondo Band upfront - I recommend you get an eye exam by an eye doctor for the flash of light you saw yesterday - Please return if heel symptoms fail to improve or worsen  Take care and seek immediate care sooner if you develop any concerns. Please remember to show up 15 minutes before your scheduled appointment time!  Tiffany Kocher, DO [facility]

## 2023-09-03 NOTE — Progress Notes (Signed)
SUBJECTIVE:   CHIEF COMPLAINT / HPI:   Hypertension Patient presents for hypertension follow-up.  Blood pressure well-controlled today.  Reports compliance with all her medications including Entresto, spironolactone and carvedilol.   Tobacco use Patient is currently undergoing tobacco cessation counseling.  Recently saw Dr. Raymondo Band and started on varenicline 08/05/2023.  She has reduced her smoking from 5 packs/week to 2.5 packs/week, and her daughter has noticed a difference in her smoking habits.  Additionally, patient has noticed less cravings and an increased dislike for the taste of cigarettes.  Her planned quit date is tomorrow, however her absolute quit date is not until February before the birth of her grandchild.  Right heel pain Previously seen for bilateral heel pain, was instructed to use orthotic inserts.  Since using the orthotic inserts, her left heel has significantly improved however she still having residual right heel pain.  Additionally, she is having increased hours at work and this is significantly increasing her pain.  She reports that she can usually stand about 4 hours before needing a break, and working more than 6 hours/day greatly increases her pain. She is currently applying for disability through DSS for her congestive heart failure, and is looking for work restrictions to allow more frequent breaks.  Vision disturbance Yesterday patient reports a bright white flash in her right visual field, lingered temporarily and then resolved.  Additionally she had a headache and increased perspiration, which lasted less than an hour.  She reports this occurred after she became very angry related to a work incident.  She had no chest pain, palpitations, shortness of breath, focal neurologic deficits, LOC, falls.  PERTINENT  PMH / PSH: Congestive heart failure, NYHA class II  OBJECTIVE:   BP 124/65   Pulse 80   Ht 5\' 1"  (1.549 m)   Wt 130 lb (59 kg)   LMP 04/27/2013  Comment: Perimenapausal   SpO2 99%   BMI 24.56 kg/m    General: NAD, pleasant Cardio: RRR, no MRG. Cap Refill <2s. Respiratory: CTAB, normal wob on RA Right foot: No gross deformity, no ecchymosis, no swelling. TTP over insertion site of Achilles tendon and along the length of Achilles tendon.  F ROM.  5/5 strength and normal sensation. Skin: Warm and dry Neuro: CN II: PERRL CN III, IV,VI: EOMI CV V: Normal sensation in V1, V2, V3 CVII: Symmetric smile and brow raise CN VIII: Normal hearing CN IX,X: Symmetric palate raise  CN XI: 5/5 shoulder shrug CN XII: Symmetric tongue protrusion  UE and LE strength 5/5 2+ LE reflexes  Normal sensation in UE and LE bilaterally   ASSESSMENT/PLAN:   Assessment & Plan Pain of right heel Primarily suspect Achilles tendinopathy based on exam.  Differential includes: Haglund's deformity, retrocalcaneal bursitis, plantar fasciitis, fat pad atrophy, calcaneal fracture. - Eccentric Achilles exercises demonstrated in office today - Note for work restrictions provided, see communication for detail - Follow-up if symptoms fail to improve, consider referral to sports medicine for diagnostic ultrasound v. shockwave therapy Tobacco use Good progress toward cessation. - Continue ovarian cycling 0.5 mg tablet twice daily, and at least 2 weeks after quit date Primary hypertension Well-controlled.  Compliant with medications and without side effects. - Continue Entresto 24-26 mg daily, spironolactone 25 mg daily, carvedilol 12.5 mg twice daily Vision disturbance Differential includes: Migraine with aura versus retinal detachment.  No focal neurologic deficits, well-appearing and stable vital signs. - Recommend follow-up with ophthalmology - Recommend follow-up with PCP if symptoms recur  Tiffany Kocher, DO [facility]

## 2023-09-08 ENCOUNTER — Ambulatory Visit: Payer: Medicaid Other | Admitting: Pharmacist

## 2023-09-23 ENCOUNTER — Telehealth: Payer: Self-pay | Admitting: Pharmacist

## 2023-09-23 NOTE — Telephone Encounter (Signed)
 Patient contacted for follow-up of tobacco intake reduction/cessation efforts  Since last contact patient reports reduced use of cigarettes to 1 pack every three days.   Follow-up visit scheduled 10/04/2023   Total time with patient call and documentation of interaction: 12 minutes.

## 2023-10-04 ENCOUNTER — Ambulatory Visit: Payer: Medicaid Other | Admitting: Pharmacist

## 2023-10-09 ENCOUNTER — Other Ambulatory Visit: Payer: Self-pay | Admitting: Student

## 2023-10-09 DIAGNOSIS — I5022 Chronic systolic (congestive) heart failure: Secondary | ICD-10-CM

## 2023-11-01 ENCOUNTER — Telehealth: Payer: Self-pay | Admitting: Pharmacist

## 2023-11-01 NOTE — Telephone Encounter (Signed)
 Reviewed and agree with Dr Macky Lower plan.

## 2023-11-01 NOTE — Telephone Encounter (Signed)
 Patient contacted for follow/up of tobacco intake reduction  Since last contact patient reports stressors and continuing to smoke 1 pack every 3 days.   Willing to reschedule. Made appointment on 2/19 at 9:00 AM  Total time with patient call and documentation of interaction: 8 minutes.

## 2023-11-10 ENCOUNTER — Encounter: Payer: Self-pay | Admitting: Pharmacist

## 2023-11-10 ENCOUNTER — Telehealth: Payer: Medicaid Other | Admitting: Pharmacist

## 2023-11-10 DIAGNOSIS — F17200 Nicotine dependence, unspecified, uncomplicated: Secondary | ICD-10-CM

## 2023-11-10 NOTE — Patient Instructions (Signed)
 Nice to meet with you today!  Congratulations on the birth of your granddaughter.    Medication Changes: Re-START  - varenticline 0.5mg  once daily with food  Continue all other medication the same.    Tobacco Patient Instructions  Quitting smoking is one of the most important decisions you can make for your current and future health. Consider what you dislike about smoking and how quitting could personally benefit you. Try to cut down. Aim for reducing the amount you smoke from 6-7 per day to 5 cigarettes or less per day in the next two weeks.    Starting today, Be a Quitter!  Remind yourself why you want to quit.  Delay your first cigarette of the day for as long as possible.  Start cleaning out all pockets, drawers, and your car of cigarettes.  Getting Through the Cravings Once You Are Smoke Free: Each craving will last about 10 minutes, whether or not you smoke. Here's how to get through the cravings without cigarettes:  DELAY: Tell yourself that you'll wait for the next craving. Do it every time! DEEP BREATHS: One reason smoking feels good is because you breathe in deeply to inhale. Take four slow, deep breaths and feel the relaxation without the hamful effects of cigarettes. DRINK WATER: Drink a glass of cool water. It will give your hands and mouth something to do and will help flush the nicotine out of your system faster. DIVERT: Do something else -- brush your teeth, take a walk, call a friend who can offer you support. Just moving onto something other than thinking about cigarettes will move you through the craving.   Frequently Asked Questions  What can I do when I get the urge to smoke? To get through the urge to smoke, try the following:  Review your reasons for quitting and think of all the benefits to your health, your finances, and your family.  Remind yourself that there is no such thing as just one cigarette -- or even one puff.  Ride out the desire to smoke. Use  the 4 Os -- Delay, Deep Breaths, Drink Water and Divert to get you through. The craving will go away eventually. Do not fool yourself into thinking you can have just one cigarette.  Any tips on how to deal with stress? Stress is a natural part of life. The key is to deal with it without reaching for a cigarette. Taking deep breaths, counting backwards from 10 and asking yourself 1-how big a deal is this?"  Writing down your feelings, talking with a friend and doing things like positive self-talk and meditation are some other ways that people deal with daily stress.  What if I start smoking again? Slips happen. Most people try to quit smoking a few times before they are successful. Don't beat yourself up if this happens to you! Ask yourself if this was a slip or a relapse. A slip is a one-time mistake that is quickly corrected. A relapse is going back to your old smoking habits.   If you slip, don't give up. Think of it as a learning experience. Ask yourself what went wrong and renew your commitment to staying away from smoking for good.  If you relapse, try not to get discouraged. Ask yourself the question "What caused me to start smoking?" Figure out what helped you and what didn't when you tried to quit. Knowing why you relapsed is useful information for your next attempt to quit.

## 2023-11-10 NOTE — Progress Notes (Signed)
   S:   Chief Complaint  Patient presents with   Medication Management    Tobacco Intake Reduction   Due to weather issues - visit was conducted virtually.  Patient was on the phone call from a room at [facility] where she was holding a newborn infant (born at ~ 5AM today).  I was at home during interaction.   63 y.o. female working on assistance with tobacco dependence.  PMH is significant for CHF.   Patient was referred and last seen by Primary Care Provider, Dr. Claudean Severance, on 09/03/2023.   At last visit in pharmacy clinic, 08/05/2023, .   Most amount smoked: 12 cigarettes/day (< 15/day) Brand smoked [employer], [employer]. Number of cigarettes/day 7 Estimated nicotine content per cigarette (mg) 1 mg.  Estimated nicotine intake per day 7 mg.   Denies waking to smoke at night, smokes 0 times per night.    Medications used in past cessation efforts include:  varenicline  Rates IMPORTANCE of quitting tobacco as high.   Motivation to quit: health  Reports smoking in the AM - first cigarette with breakfast ~ 1 hour after waking.  Reports smoking cigarette prior to going into work - midday. Reports smoking ~ 5:00 PM after work (denies smoking at work) Reports avoiding smoking for the last 1-1.5 hours prior to bedtime.   Notes that she has reduced beer intake (1 can can last her 2 hours)  She also notes that drinking beer is not a specific trigger for smoking on a consistent basis currently.   O: Clinical ASCVD: Yes   CHF The 10-year ASCVD risk score (Arnett DK, et al., 2019) is: 10%   Values used to calculate the score:     Age: 50 years     Sex: Female     Is Non-Hispanic African American: Yes     Diabetic: No     Tobacco smoker: Yes     Systolic Blood Pressure: 124 mmHg     Is BP treated: Yes     HDL Cholesterol: 97 mg/dL     Total Cholesterol: 234 mg/dL  Review of Systems  All other systems reviewed and are negative.   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  She was very happy to report the birth of her granddaughter this morning.  She remains motivated to quit. -As she is  only intermittently using varenicline, we discussed reiniiation of varenicline 0.5 mg by mouth once daily with food  -- Plan to decrease smoking to 5 cigarettes/day in two weeks. -- Planned for setting quit date in near future.   -- Consider spirometry at a future visit  Written patient instructions provided. Patient verbalized understanding of treatment plan.  Total time in face to face virtual interaction/counseling 16 minutes.    Follow-up:  Pharmacist 2 weeks PCP clinic visit TBD

## 2023-11-10 NOTE — Assessment & Plan Note (Signed)
 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  She was very happy to report the birth of her granddaughter this morning.  She remains motivated to quit. -As she is  only intermittently using varenicline, we discussed reiniiation of varenicline 0.5 mg by mouth once daily with food  -- Plan to decrease smoking to 5 cigarettes/day in two weeks. -- Planned for setting quit date in near future.   -- Consider spirometry at a future visit

## 2023-11-11 NOTE — Progress Notes (Signed)
 Reviewed and agree with Dr Macky Lower plan.

## 2023-11-24 ENCOUNTER — Ambulatory Visit: Payer: Medicaid Other | Admitting: Pharmacist

## 2023-11-24 ENCOUNTER — Encounter: Payer: Self-pay | Admitting: Pharmacist

## 2023-11-24 VITALS — BP 143/81 | HR 89 | Wt 129.6 lb

## 2023-11-24 DIAGNOSIS — F17200 Nicotine dependence, unspecified, uncomplicated: Secondary | ICD-10-CM

## 2023-11-24 NOTE — Patient Instructions (Signed)
 Nice to see you today! Great work and let's keep working towards your goal of quitting by 03/31!   Medication Changes:  Continue all other medication the same.   Tobacco Patient Instructions  Quitting smoking is one of the most important decisions you can make for your current and future health. Consider what you dislike about smoking and how quitting could personally benefit you. Try to cut down. Aim for reducing the amount you smoke by 7 cigarettes over the next 4 weeks.  My target quit date is: 12/20/2023  Starting today, Be a Quitter!  Remind yourself why you want to quit.  Delay your first cigarette of the day for as long as possible.  Start cleaning out all pockets, drawers, and your car of cigarettes.  Getting Through the Cravings Once You Are Smoke Free: Each craving will last about 10 minutes, whether or not you smoke. Here's how to get through the cravings without cigarettes:  DELAY: Tell yourself that you'll wait for the next craving. Do it every time! DEEP BREATHS: One reason smoking feels good is because you breathe in deeply to inhale. Take four slow, deep breaths and feel the relaxation without the hamful effects of cigarettes. DRINK WATER: Drink a glass of cool water. It will give your hands and mouth something to do and will help flush the nicotine out of your system faster. DIVERT: Do something else -- brush your teeth, take a walk, call a friend who can offer you support. Just moving onto something other than thinking about cigarettes will move you through the craving.   Frequently Asked Questions  What can I do when I get the urge to smoke? To get through the urge to smoke, try the following:  Review your reasons for quitting and think of all the benefits to your health, your finances, and your family.  Remind yourself that there is no such thing as just one cigarette -- or even one puff.  Ride out the desire to smoke. Use the 4 Os -- Delay, Deep Breaths, Drink  Water and Divert to get you through. The craving will go away eventually. Do not fool yourself into thinking you can have just one cigarette.  Any tips on how to deal with stress? Stress is a natural part of life. The key is to deal with it without reaching for a cigarette. Taking deep breaths, counting backwards from 10 and asking yourself 1-how big a deal is this?"  Writing down your feelings, talking with a friend and doing things like positive self-talk and meditation are some other ways that people deal with daily stress.  What if I start smoking again? Slips happen. Most people try to quit smoking a few times before they are successful. Don't beat yourself up if this happens to you! Ask yourself if this was a slip or a relapse. A slip is a one-time mistake that is quickly corrected. A relapse is going back to your old smoking habits.   If you slip, don't give up. Think of it as a learning experience. Ask yourself what went wrong and renew your commitment to staying away from smoking for good.  If you relapse, try not to get discouraged. Ask yourself the question "What caused me to start smoking?" Figure out what helped you and what didn't when you tried to quit. Knowing why you relapsed is useful information for your next attempt to quit.

## 2023-11-24 NOTE — Assessment & Plan Note (Signed)
 Tobacco use disorder with history of moderate nicotine dependence of 40 years duration in a patient who is excellent candidate for success. Granddaughter is now 43 weeks old, pt reports motivation to quit to spend more time with grandchildren. Remains motivated to quit and has made progress with intake reduction.  Now reports smoking 6 cigarettes per day.    -Continued varenicline, pt only takes once per week or every other week as needed. Patient counseled on purpose, proper use, and potential adverse effects, including GI upset. -Provided information on 1 800-QUIT NOW support program.  -Goal to quit by end of the month. Goal within 10-14 days to reduce smoking during work and increase time spent not smoking in the morning after waking from 1 hour to 2.

## 2023-11-24 NOTE — Progress Notes (Signed)
 S:   Chief Complaint  Patient presents with   Medication Management    Tobacco Intake Reduction / Cessation   63 y.o. female who presents for evaluation/assistance with tobacco dependence.  PMH is significant for CHF, HTN.   Patient was referred and last seen by Primary Care Provider, Dr. Claudean Severance, on 09/03/2023.   At last visit via telehealth, 11/10/23.   Most amount smoked: 12 cigarettes/day (< 15/day) Brand smoked [employer]. Number of cigarettes/day 7 Estimated nicotine content per cigarette (mg) 1 mg Estimated nicotine intake per day 7 mg Denies waking to smoke at night, smokes 0 times per night    Medications used in past cessation efforts include: varenicline   Rates IMPORTANCE of quitting tobacco as high    Motivation to quit: health, spend quality time with grandchildren  Reports smoking in the AM - first cigarette with breakfast ~ 1 hour after waking (most difficult to quit).  Reports smoking cigarette prior to going into work - midday. Reports smoking ~ 5:00 PM after work (denies smoking at work) Reports avoiding smoking for the last 2 hours prior to bedtime.   Most recent quit attempt 08/19/2022. Longest time ever been tobacco free 2 days after hospital stay.    O: Clinical ASCVD: Yes   CHF The 10-year ASCVD risk score (Arnett DK, et al., 2019) is: 14.9%   Values used to calculate the score:     Age: 57 years     Sex: Female     Is Non-Hispanic African American: Yes     Diabetic: No     Tobacco smoker: Yes     Systolic Blood Pressure: 143 mmHg     Is BP treated: Yes     HDL Cholesterol: 97 mg/dL     Total Cholesterol: 234 mg/dL  Review of Systems  All other systems reviewed and are negative.   Physical Exam Vitals reviewed.  Constitutional:      Appearance: Normal appearance.  Pulmonary:     Effort: Pulmonary effort is normal.  Neurological:     Mental Status: She is alert.  Psychiatric:        Mood and Affect: Mood normal.         Behavior: Behavior normal.        Thought Content: Thought content normal.        Judgment: Judgment normal.    Patient is participating in a Managed Medicaid Plan:  Yes  A/P: Tobacco use disorder with history of moderate nicotine dependence of 40 years duration in a patient who is excellent candidate for success. Granddaughter is now 11 weeks old, pt reports motivation to quit to spend more time with grandchildren. Remains motivated to quit and has made progress with intake reduction.  Now reports smoking 6 cigarettes per day.    -Continued varenicline, pt only takes once per week or every other week as needed. Patient counseled on purpose, proper use, and potential adverse effects, including GI upset. -Provided information on 1 800-QUIT NOW support program.  -Goal to quit by end of the month. Goal within 10-14 days to reduce smoking during work and increase time spent not smoking in the morning after waking from 1 hour to 2.  HTN - elevated in office today. Patient reported non-adherence with taking pills today.   Follow - next visit.   Written patient instructions provided. Patient verbalized understanding of treatment plan.  Total time in face to face counseling 24 minutes.    Follow-up:  Pharmacist None -  Phone call in early April.  PCP clinic visit 12/07/23 Patient seen with Threasa Heads, PharmD Candidate and Mack Guise, PharmD Candidate.

## 2023-11-29 NOTE — Progress Notes (Signed)
 Reviewed and agree with Dr Macky Lower plan.

## 2023-12-06 ENCOUNTER — Telehealth (HOSPITAL_COMMUNITY): Payer: Self-pay

## 2023-12-06 NOTE — Telephone Encounter (Signed)
 Called and left patient a voice message to confirm/remind patient of their appointment at the [facility] on 12/07/23.   And reminded to bring in all medications and/or complete list.

## 2023-12-07 ENCOUNTER — Ambulatory Visit (HOSPITAL_COMMUNITY)
Admission: RE | Admit: 2023-12-07 | Discharge: 2023-12-07 | Disposition: A | Discharge status: Home / Self Care | Payer: Medicaid Other | Source: Ambulatory Visit | Attending: Family Medicine | Admitting: Family Medicine

## 2023-12-07 ENCOUNTER — Encounter (HOSPITAL_COMMUNITY): Payer: Self-pay

## 2023-12-07 VITALS — BP 120/80 | HR 77 | Wt 131.4 lb

## 2023-12-07 DIAGNOSIS — Z79899 Other long term (current) drug therapy: Secondary | ICD-10-CM | POA: Diagnosis not present

## 2023-12-07 DIAGNOSIS — Z720 Tobacco use: Secondary | ICD-10-CM

## 2023-12-07 DIAGNOSIS — E7800 Pure hypercholesterolemia, unspecified: Secondary | ICD-10-CM

## 2023-12-07 DIAGNOSIS — K222 Esophageal obstruction: Secondary | ICD-10-CM | POA: Insufficient documentation

## 2023-12-07 DIAGNOSIS — I5022 Chronic systolic (congestive) heart failure: Secondary | ICD-10-CM | POA: Diagnosis not present

## 2023-12-07 DIAGNOSIS — I110 Hypertensive heart disease with heart failure: Secondary | ICD-10-CM | POA: Diagnosis not present

## 2023-12-07 DIAGNOSIS — F17210 Nicotine dependence, cigarettes, uncomplicated: Secondary | ICD-10-CM | POA: Insufficient documentation

## 2023-12-07 DIAGNOSIS — E785 Hyperlipidemia, unspecified: Secondary | ICD-10-CM | POA: Diagnosis not present

## 2023-12-07 DIAGNOSIS — F1010 Alcohol abuse, uncomplicated: Secondary | ICD-10-CM | POA: Diagnosis not present

## 2023-12-07 DIAGNOSIS — R5383 Other fatigue: Secondary | ICD-10-CM | POA: Insufficient documentation

## 2023-12-07 DIAGNOSIS — Z7984 Long term (current) use of oral hypoglycemic drugs: Secondary | ICD-10-CM | POA: Diagnosis not present

## 2023-12-07 DIAGNOSIS — I10 Essential (primary) hypertension: Secondary | ICD-10-CM | POA: Diagnosis not present

## 2023-12-07 LAB — COMPREHENSIVE METABOLIC PANEL
ALT: 18 U/L (ref 0–44)
AST: 23 U/L (ref 15–41)
Albumin: 3.8 g/dL (ref 3.5–5.0)
Alkaline Phosphatase: 78 U/L (ref 38–126)
Anion gap: 9 (ref 5–15)
BUN: 12 mg/dL (ref 6–20)
CO2: 21 mmol/L — ABNORMAL LOW (ref 22–32)
Calcium: 8.9 mg/dL (ref 8.9–10.3)
Chloride: 103 mmol/L (ref 98–111)
Creatinine, Ser: 0.57 mg/dL (ref 0.44–1.00)
GFR, Estimated: >60 mL/min (ref >60)
Glucose, Bld: 81 mg/dL (ref 70–99)
Potassium: 4.1 mmol/L (ref 3.5–5.1)
Sodium: 133 mmol/L — ABNORMAL LOW (ref 135–145)
Total Bilirubin: 0.5 mg/dL (ref 0.0–1.2)
Total Protein: 6.8 g/dL (ref 6.5–8.1)

## 2023-12-07 MED ORDER — ATORVASTATIN CALCIUM 40 MG PO TABS: 40.0000 mg | ORAL_TABLET | Freq: Every day | ORAL | 3 refills | Status: DC

## 2023-12-07 MED ORDER — SPIRONOLACTONE 25 MG PO TABS: 25.0000 mg | ORAL_TABLET | Freq: Every day | ORAL | 3 refills | Status: DC

## 2023-12-07 NOTE — Progress Notes (Signed)
 ADVANCED HF CLINIC NOTE   Primary Care: Dr. Shirlean Mylar [facility])  Primary Cardiologist: Dr. Eden Emms (not seen since 2016) HF Cardiologist: Dr. Gala Romney    HPI: Angela Serrano is a 63 y.o. woman with HTN, GERD with esophageal stricture s/p dilation in 2016 and tobacco use.  Saw Dr. Eden Emms in 2016 for moderate pericardial effusion found incidentally on chest CT.  TEE 1/16 EF 55-60% and normal valves. Treated with NSAIDs for pleuropericarditis.   Echo 4/21 EF 35-40% global HK grade II DD.   We saw her for the first time 01/2020. Was not feeling well. NYHA III. Underwent R/L cath: EF 40-45% no CAD.  Normal RHC.   cMRI 07/02/20 which showed EF up to 50% w/ diffuse mid myocardial gadolinium uptake non-specific can be seen in non ischemic DCM. RV normal.   Echo 4/23 showed EF 55-60%, G1DD, normal RV  Follow up 05/20/23, last seen 10/2020. Off most GDMT  Echo 8/24 showed EF 45-50%.  Today she returns for HF follow up. Overall feeling fine. She has SOB walking further distances on flat ground. She feels fatigued. Occasional dizziness, no falls. 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 Denies palpitations, abnormal bleeding, CP, or PND/Orthopnea. Appetite ok. No fever or chills. Weight at home 125-128 pounds. Taking all medications. Smoking 1/2 ppd, drinking 3-5 tall boy beers/day, no drugs.  Cardiac Studies - Echo (8/24): EF 45-50%, G1DD, normal RV  - Echo (4/23): EF 55-60%, G1DD, normal RV  - cMRI (10/21): LVEF 50% with diffuse HK in apex, normal RV, tri leaflet AV with mild appearing AR, non-specific diffuse mid myocardia LGE, can be seen in non-ischemic DCM  - R/LHC (5/21): normal cors, EF 40-45%, normal hemodynamics Ao = 143/76 (103) LV = 132/4 RA = 2 RV = 26/4 PA = 26/8 (17) PCW = 6 Fick cardiac output/index = 3.2/2.4 PVR = 2.9 WU Ao sat = 98% PA sat = 69%, 69%   Past Medical History:  Diagnosis Date   Acid reflux disease    Benign essential HTN    CHF (congestive  heart failure), NYHA class I (HCC) 01/17/2015   Poor dentition    Seasonal allergies 2011   sneezing and hoarseness   Current Outpatient Medications  Medication Sig Dispense Refill   albuterol (VENTOLIN HFA) 108 (90 Base) MCG/ACT inhaler Inhale 2 puffs into the lungs every 6 (six) hours as needed for wheezing or shortness of breath. 8 g 2   atorvastatin (LIPITOR) 40 MG tablet Take 1 tablet (40 mg total) by mouth daily. 30 tablet 5   carvedilol (COREG) 12.5 MG tablet Take 1 tablet (12.5 mg total) by mouth 2 (two) times daily. 180 tablet 3   FARXIGA 10 MG TABS tablet Take 1 tablet (10 mg total) by mouth daily before breakfast. 30 tablet 11   nitroGLYCERIN (NITROSTAT) 0.3 MG SL tablet PLACE 1 TABLET (0.3 MG TOTAL) UNDER THE TONGUE EVERY 5 (FIVE) MINUTES AS NEEDED FOR CHEST PAIN (IF DOES NOT IMPROVE AFTER 5 MINUTES, PLEASE SEEK IMMEDIATE MEDICAL CARE BY 911 OR EMERGENCY ROOM.). 300 tablet 1   sacubitril-valsartan (ENTRESTO) 24-26 MG Take 1 tablet by mouth 2 (two) times daily. 60 tablet 3   spironolactone (ALDACTONE) 25 MG tablet TAKE 1 TABLET (25 MG TOTAL) BY MOUTH DAILY. 90 tablet 2   varenicline (CHANTIX) 0.5 MG tablet Take 1 tablet (0.5 mg total) by mouth 2 (two) times daily. Use as directed. 60 tablet 2   No current facility-administered medications for  this encounter.   No Known Allergies  Social History   Socioeconomic History   Marital status: Single    Spouse name: Not on file   Number of children: 3   Years of education: Not on file   Highest education level: Some college, no degree  Occupational History   Occupation: Armed forces operational officer: [place]    Comment: [employer]   Tobacco Use   Smoking status: Every Day    Current packs/day: 0.50    Average packs/day: 0.5 packs/day for 45.2 years (22.6 ttl pk-yrs)    Types: Cigarettes    Start date: 09/21/1978   Smokeless tobacco: Never   Tobacco comments:    About 1/2 pack per day.  Vaping Use   Vaping status:  Never Used  Substance and Sexual Activity   Alcohol use: Yes    Alcohol/week: 2.0 standard drinks of alcohol    Types: 2 Cans of beer per week    Comment: 3 beer per night for sleep   Drug use: No   Sexual activity: Not Currently    Birth control/protection: None  Other Topics Concern   Not on file  Social History Narrative   Lives with daughters, and 2 grandchildren. Total of 4 grandchildren, likes to dance for exercise. Works at [employer] and [employer] now.   Social Drivers of Corporate investment banker Strain: Low Risk  (09/04/2023)   Overall Financial Resource Strain (CARDIA)    Difficulty of Paying Living Expenses: Not very hard  Food Insecurity: No Food Insecurity (09/04/2023)   Hunger Vital Sign    Worried About Running Out of Food in the Last Year: Never true    Ran Out of Food in the Last Year: Never true  Transportation Needs: No Transportation Needs (09/04/2023)   PRAPARE - Administrator, Civil Service (Medical): No    Lack of Transportation (Non-Medical): No  Physical Activity: Insufficiently Active (09/04/2023)   Exercise Vital Sign    Days of Exercise per Week: 4 days    Minutes of Exercise per Session: 20 min  Stress: No Stress Concern Present (09/04/2023)   [employer] of Occupational Health - Occupational Stress Questionnaire    Feeling of Stress : Only a little  Social Connections: Moderately Isolated (09/04/2023)   Social Connection and Isolation Panel [NHANES]    Frequency of Communication with Friends and Family: Twice a week    Frequency of Social Gatherings with Friends and Family: Once a week    Attends Religious Services: More than 4 times per year    Active Member of [employer] or Organizations: No    Attends Engineer, structural: Not on file    Marital Status: Never married  Intimate Partner Violence: Not on file   Family History  Problem Relation Age of Onset   Cancer Mother 54       Bone   Alcohol abuse Father 81    Hypertension Sister    Hyperlipidemia Sister    Hypertension Sister    Thyroid disease Daughter    Diabetes Maternal Aunt    Cancer Maternal Aunt 60       Bone    Diabetes Maternal Grandmother    Cancer Maternal Aunt 40       Bone    Wt Readings from Last 3 Encounters:  12/07/23 59.6 kg (131 lb 6.4 oz)  11/24/23 58.8 kg (129 lb 9.6 oz)  09/03/23 59 kg (130 lb)  BP 120/80   Pulse 77   Wt 59.6 kg (131 lb 6.4 oz)   LMP 04/27/2013 Comment: Perimenapausal   SpO2 100%   BMI 24.83 kg/m   PHYSICAL EXAM: General:  NAD. No resp difficulty, walked into clinic HEENT: Normal Neck: Supple. No JVD. Cor: Regular rate & rhythm. No rubs, gallops or murmurs. Lungs: Clear Abdomen: Soft, nontender, nondistended.  Extremities: No cyanosis, clubbing, rash, edema Neuro: Alert & oriented x 3, moves all 4 extremities w/o difficulty. Affect pleasant.  ASSESSMENT & PLAN: 1. Chronic systolic HF/chest pressure - Echo (4/21): EF 35-40% global HK grade II DD. Unclear etiology suspect HTN +/- ETOH - R/LHC  (5/21): normal cors, EF 40-45% RHC ok - cMRI (10/21): LVEF 50% w/ diffuse mid myocardial gadolinium uptake non-specific can be seen in non ischemic DCM. RV normal.  - Echo (4/23): EF 55-60%, G1DD, normal RV - Echo (8/24): EF 45-50%, RV ok - NYHA II. Volume OK today. Does not need daily loops - Continue Entresto 24/26 mg bid. - Continue spironolactone 25 mg daily  - Continue carvedilol 12.5 mg bid  - Continue Farxiga 10 mg daily. No GU symptoms - we again discussed need for ETOH cessation. Advised to reduce consumption  - Labs today. - Update echo next visit.  2. Severe HTN - BP now well controlled - GDMT as above  3. ETOH use - Discussed possibility that this could be cause of reduced EF - Discussed cessation  4. Tobacco use - Still smoking ~ 1/2 ppd  - Encouraged cessation - She has Chantix  5. HLD - Recent LDL 124 - Continue statin - Repeat lipids/LFTs   Follow up in 3 months  with Dr. Gala Romney + echo  Angela Ganong, FNP  2:47 PM

## 2023-12-07 NOTE — Patient Instructions (Signed)
 There has been no changes to your medications.  Labs done today, your results will be available in MyChart, we will contact you for abnormal readings.  Your physician has requested that you have an echocardiogram. Echocardiography is a painless test that uses sound waves to create images of your heart. It provides your doctor with information about the size and shape of your heart and how well your heart's chambers and valves are working. This procedure takes approximately one hour. There are no restrictions for this procedure. Please do NOT wear cologne, perfume, aftershave, or lotions (deodorant is allowed). Please arrive 15 minutes prior to your appointment time.  Please note: We ask at that you not bring children with you during ultrasound (echo/ vascular) testing. Due to room size and safety concerns, children are not allowed in the ultrasound rooms during exams. Our front office staff cannot provide observation of children in our lobby area while testing is being conducted. An adult accompanying a patient to their appointment will only be allowed in the ultrasound room at the discretion of the ultrasound technician under special circumstances. We apologize for any inconvenience.  Your physician recommends that you schedule a follow-up appointment in: 3 months.  If you have any questions or concerns before your next appointment please send [place] a message through [place] or call our office at [phone].    TO LEAVE A MESSAGE FOR THE NURSE SELECT OPTION 2, PLEASE LEAVE A MESSAGE INCLUDING: YOUR NAME DATE OF BIRTH CALL BACK NUMBER REASON FOR CALL**this is important as we prioritize the call backs  YOU WILL RECEIVE A CALL BACK THE SAME DAY AS LONG AS YOU CALL BEFORE 4:00 PM  At the [facility], you and your health needs are our priority. As part of our continuing mission to provide you with exceptional heart care, we have created designated Provider Care Teams. These Care  Teams include your primary Cardiologist (physician) and Advanced Practice Providers (APPs- Physician Assistants and Nurse Practitioners) who all work together to provide you with the care you need, when you need it.   You may see any of the following providers on your designated Care Team at your next follow up: Dr Arvilla Meres Dr Marca Ancona Dr. Dorthula Nettles Dr. Clearnce Hasten Amy Filbert Schilder, NP Robbie Lis, [place] [facility] [place], [place] Brynda Peon, NP [place] Lee, NP Clarisa Kindred, NP Karle Plumber, PharmD Enos Fling, PharmD   Please be sure to bring in all your medications bottles to every appointment.    Thank you for choosing [facility]

## 2023-12-08 LAB — LIPID PANEL
Cholesterol: 220 mg/dL — ABNORMAL HIGH (ref 0–200)
HDL: 99 mg/dL
LDL Cholesterol: 98 mg/dL (ref 0–99)
Total CHOL/HDL Ratio: 2.2 ratio
Triglycerides: 116 mg/dL
VLDL: 23 mg/dL (ref 0–40)

## 2023-12-16 ENCOUNTER — Other Ambulatory Visit: Payer: Self-pay | Admitting: Student

## 2023-12-21 ENCOUNTER — Telehealth: Payer: Self-pay | Admitting: Pharmacist

## 2023-12-21 NOTE — Telephone Encounter (Signed)
 Reviewed and agree with Dr Macky Lower plan.

## 2023-12-21 NOTE — Telephone Encounter (Signed)
-----   Message from Madelon Lips sent at 11/24/2023  4:16 PM EST ----- Regarding: Quit ? Planned quit date 3/31 - call for follow up on 4/1 or 4/2   She is off on 4/1 and 4/2  - any time works

## 2023-12-21 NOTE — Telephone Encounter (Signed)
.  tmko 

## 2023-12-21 NOTE — Telephone Encounter (Signed)
 Patient contacted for follow/up of tobacco intake reduction / cessation attempt.   Since last appointment patient reports reducing smoking from 7 to 6 cigarettes throughout the day. Patient reports a goal of 1 pack per week (~3 cigarettes per day).  Medications currently being used; Varenicline - 0.5 mg BID  Patient denies any significant side effects from tobacco cessation therapy.   Patient is participating in a Managed Medicaid Plan:  Yes  Total time with patient call and documentation of interaction: 7 minutes. Follow-up phone call planned: end of April

## 2024-01-18 ENCOUNTER — Telehealth: Payer: Self-pay | Admitting: Pharmacist

## 2024-01-18 DIAGNOSIS — F17200 Nicotine dependence, unspecified, uncomplicated: Secondary | ICD-10-CM

## 2024-01-18 NOTE — Telephone Encounter (Signed)
-----   Message from Cristal Don sent at 12/21/2023  9:55 AM EDT ----- Regarding: Reduce to 1 pack per week by end of April 2025 Call on Tuesday or Wednesday ----- Message ----- From: Jveon Pound G, RPH-CPP Sent: 12/20/2023  12:00 AM EDT To: Abbey Abbe, RPH-CPP Subject: Quit ?                                         Planned quit date 3/31 - call for follow up on 4/1 or 4/2   She is off on 4/1 and 4/2  - any time works

## 2024-01-18 NOTE — Telephone Encounter (Signed)
 Patient contacted for follow/up of tobacco intake reduction / cessation attempt.   Since last contact patient reports she has been smoking about the same - ~ 1 pack every 4 days.   Medications currently being used;  Varenicline   Patient denies any significant side effects from tobacco cessation therapy.   Continues to rates IMPORTANCE of quitting tobacco as high.  Continues to rate CONFIDENCE of quitting tobacco as high.   Most common triggers to use tobacco include; Boredom   Motivation to quit: newborn, health.   Patient is participating in a Managed Medicaid Plan:  Yes  Total time with patient call and documentation of interaction: 11 minutes. Follow-up phone call planned: 3 weeks

## 2024-01-18 NOTE — Assessment & Plan Note (Signed)
 Patient contacted for follow/up of tobacco intake reduction / cessation attempt.   Since last contact patient reports she has been smoking about the same - ~ 1 pack every 4 days.   Medications currently being used;  Varenicline   Patient denies any significant side effects from tobacco cessation therapy.   Continues to rates IMPORTANCE of quitting tobacco as high.  Continues to rate CONFIDENCE of quitting tobacco as high.   Most common triggers to use tobacco include; Boredom

## 2024-01-19 NOTE — Telephone Encounter (Signed)
 Reviewed and agree with Dr Macky Lower plan.

## 2024-01-28 DIAGNOSIS — R92323 Mammographic fibroglandular density, bilateral breasts: Secondary | ICD-10-CM | POA: Diagnosis not present

## 2024-01-28 DIAGNOSIS — Z1231 Encounter for screening mammogram for malignant neoplasm of breast: Secondary | ICD-10-CM | POA: Diagnosis not present

## 2024-01-28 LAB — HM MAMMOGRAPHY

## 2024-02-08 ENCOUNTER — Telehealth: Payer: Self-pay | Admitting: Pharmacist

## 2024-02-08 DIAGNOSIS — F17200 Nicotine dependence, unspecified, uncomplicated: Secondary | ICD-10-CM

## 2024-02-08 NOTE — Telephone Encounter (Signed)
 Patient contacted for follow/up of tobacco intake reduction / cessation attempt.   Since last contact patient reports "smoking about the same"  Medications currently being used;  Varenicline  - 0.5mg  BID  Patient denies any significant side effects from tobacco cessation therapy.  Rates CONFIDENCE in remaining quit from tobacco as high.  Discussed goals and patient agreed that smoking 2 packs or less per week would be her goal for the next month.  Total time with patient call and documentation of interaction: 11 minutes. Follow-up phone call planned: 5 weeks

## 2024-02-08 NOTE — Telephone Encounter (Signed)
-----   Message from Cristal Don sent at 01/18/2024  3:33 PM EDT ----- Regarding: Tobacco Cessation Planned quit prior to Mammogram scheduled for mid-May.  Agreed to call back ~ 3 weeks from 4/29

## 2024-02-08 NOTE — Assessment & Plan Note (Signed)
 Patient contacted for follow/up of tobacco intake reduction / cessation attempt.   Since last contact patient reports "smoking about the same"  Medications currently being used;  Varenicline  - 0.5mg  BID  Patient denies any significant side effects from tobacco cessation therapy.  Rates CONFIDENCE in remaining quit from tobacco as high.  Discussed goals and patient agreed that smoking 2 packs or less per week would be her goal for the next month.  Total time with patient call and documentation of interaction: 11 minutes. Follow-up phone call planned: 5 weeks

## 2024-02-09 NOTE — Telephone Encounter (Signed)
 Reviewed and agree with Dr Macky Lower plan.

## 2024-02-28 ENCOUNTER — Ambulatory Visit: Attending: Family Medicine

## 2024-02-28 ENCOUNTER — Ambulatory Visit: Admitting: Student

## 2024-02-28 ENCOUNTER — Encounter: Payer: Self-pay | Admitting: Student

## 2024-02-28 VITALS — BP 110/60 | HR 94 | Ht 61.0 in | Wt 129.0 lb

## 2024-02-28 DIAGNOSIS — R55 Syncope and collapse: Secondary | ICD-10-CM | POA: Diagnosis not present

## 2024-02-28 DIAGNOSIS — M79662 Pain in left lower leg: Secondary | ICD-10-CM

## 2024-02-28 DIAGNOSIS — I5022 Chronic systolic (congestive) heart failure: Secondary | ICD-10-CM | POA: Diagnosis not present

## 2024-02-28 LAB — POCT GLYCOSYLATED HEMOGLOBIN (HGB A1C): Hemoglobin A1C: 5.5 % (ref 4.0–5.6)

## 2024-02-28 MED ORDER — SPIRONOLACTONE 25 MG PO TABS: 12.5000 mg | ORAL_TABLET | Freq: Every day | ORAL | 3 refills | Status: AC

## 2024-02-28 MED ORDER — NAPROXEN 500 MG PO TABS: 500.0000 mg | ORAL_TABLET | Freq: Two times a day (BID) | ORAL | 0 refills | Status: DC

## 2024-02-28 NOTE — Progress Notes (Signed)
  SUBJECTIVE:   CHIEF COMPLAINT / HPI:   Left leg pain Reports injury to left leg that occurred 1 year ago, when she hit it against a forklift.  She did not seek medical care at that time.  Over the past 1 year, she experiences pain and swelling in the same part of her shin.  Episodes last for couple days, then typically resolve.  She currently has a flare, with swelling and pain over her anterior shin.  She uses ice, elevation and OTC analgesics as needed.  She is weightbearing.  Pre-syncope Multiple episodes of sweating, palpitations, feeling lightheaded, blurry vision.  Not positional.  Resolves within seconds to minutes.  No chest pain, shortness of breath.  OBJECTIVE:   BP 110/60   Pulse 94   Ht 5\' 1"  (1.549 m)   Wt 129 lb (58.5 kg)   LMP 04/27/2013 Comment: Perimenapausal   SpO2 97%   BMI 24.37 kg/m    General: NAD, pleasant, Cardio: RRR, no MRG. Cap Refill <2s. Respiratory: CTAB, normal wob on RA MSK: Tenderness/swelling over left anterior tibia midshaft. Skin: Warm and dry Neuro: CN II: PERRL CVII: Symmetric smile and brow raise CN VIII: Normal hearing CN IX,X: Symmetric palate raise  LE strength 5/5 Normal sensation in LE bilaterally   Orthostatic VS for the past 72 hrs (Last 3 readings):  Orthostatic BP Patient Position BP Location Cuff Size Orthostatic Pulse  02/28/24 1507 117/75 Standing Right Arm Normal 88  02/28/24 1506 126/76 Sitting Right Arm Normal 83  02/28/24 1505 144/80 Supine Right Arm Normal 77     ASSESSMENT/PLAN:   Assessment & Plan Pain in left shin Remitting relapsing swelling of anterior left shin, for 1 year since injury sustained at work.  Concern for stress fracture. - X-ray ordered - OTC Tylenol , ibuprofen  - Recommend rest, ice, elevation - Note provided for work for patient to be able to sit Pre-syncope Orthostatics positive.  Currently on Entresto , spironolactone  and carvedilol  for her heart failure, will attempt to decrease doses  without stopping medication.  EKG today normal sinus, however given comorbidities will order Zio patch.  She has echocardiogram and cardiology follow-up this month. -Decrease spironolactone  from 25 to 12.5 - TSH, CBC, BMP, A1c   Lavada Porteous, DO [facility]

## 2024-02-28 NOTE — Patient Instructions (Signed)
 It was great to see you! Thank you for allowing me to participate in your care!   I recommend that you always bring your medications to each appointment as this makes it easy to ensure we are on the correct medications and helps us  not miss when refills are needed.  Our plans for today:  - I have ordered a Zio patch, this is a monitor that you will wear for 14 days.  I recommend you schedule an appointment to place Zio patch once you receive in the mail.  This will be able to record any events that happen. - Additionally, your cardiologist did order an echocardiogram for you.  I recommend you obtain this. - I have decreased your spironolactone  to 12.5 mg (half tablet), because your blood pressures are dropping too low.  Standing for prolonged amount of times can also lead to dropping blood pressure and I provided a work note for you.  An x-ray was ordered for you---you do not need an appointment to have this completed.  I recommend going to [facility] [address] [place]  If the results are normal,I will send you a letter  I will call you with results if anything is abnormal    Take care and seek immediate care sooner if you develop any concerns. Please remember to show up 15 minutes before your scheduled appointment time!  Lavada Porteous, DO [facility]

## 2024-02-28 NOTE — Progress Notes (Unsigned)
 EP to read.

## 2024-02-29 ENCOUNTER — Ambulatory Visit
Admission: RE | Admit: 2024-02-29 | Discharge: 2024-02-29 | Disposition: A | Discharge status: Home / Self Care | Source: Ambulatory Visit | Attending: Family Medicine

## 2024-02-29 ENCOUNTER — Ambulatory Visit: Payer: Self-pay | Admitting: Student

## 2024-02-29 DIAGNOSIS — M79662 Pain in left lower leg: Secondary | ICD-10-CM

## 2024-02-29 DIAGNOSIS — M79605 Pain in left leg: Secondary | ICD-10-CM | POA: Diagnosis not present

## 2024-02-29 LAB — CBC
Hematocrit: 39.7 % (ref 34.0–46.6)
Hemoglobin: 13.3 g/dL (ref 11.1–15.9)
MCH: 31.7 pg (ref 26.6–33.0)
MCHC: 33.5 g/dL (ref 31.5–35.7)
MCV: 95 fL (ref 79–97)
Platelets: 329 10*3/uL (ref 150–450)
RBC: 4.19 x10E6/uL (ref 3.77–5.28)
RDW: 12.5 % (ref 11.7–15.4)
WBC: 8.4 10*3/uL (ref 3.4–10.8)

## 2024-02-29 LAB — BASIC METABOLIC PANEL WITH GFR
BUN/Creatinine Ratio: 19 (ref 12–28)
BUN: 11 mg/dL (ref 8–27)
CO2: 19 mmol/L — ABNORMAL LOW (ref 20–29)
Calcium: 9.7 mg/dL (ref 8.7–10.3)
Chloride: 105 mmol/L (ref 96–106)
Creatinine, Ser: 0.57 mg/dL (ref 0.57–1.00)
Glucose: 89 mg/dL (ref 70–99)
Potassium: 4.7 mmol/L (ref 3.5–5.2)
Sodium: 141 mmol/L (ref 134–144)
eGFR: 104 mL/min/{1.73_m2} (ref >59)

## 2024-02-29 LAB — TSH RFX ON ABNORMAL TO FREE T4: TSH: 2.01 u[IU]/mL (ref 0.450–4.500)

## 2024-03-03 ENCOUNTER — Encounter: Payer: Self-pay | Admitting: Student

## 2024-03-03 ENCOUNTER — Ambulatory Visit (INDEPENDENT_AMBULATORY_CARE_PROVIDER_SITE_OTHER)

## 2024-03-03 VITALS — BP 111/83 | HR 100 | Ht 61.0 in | Wt 127.6 lb

## 2024-03-03 DIAGNOSIS — R55 Syncope and collapse: Secondary | ICD-10-CM

## 2024-03-03 NOTE — Patient Instructions (Signed)
 It was great to see you! Thank you for allowing me to participate in your care!  Our plans for today:  - Zio heart monitor applied today - Follow up with cardiologist at up coming visit    Take care and seek immediate care sooner if you develop any concerns.   Dr. Glenn Lange, DO [facility]

## 2024-03-03 NOTE — Progress Notes (Unsigned)
    SUBJECTIVE:   CHIEF COMPLAINT / HPI:   Patient presents for application of Zio patch ordered by PCP for further evaluation of presyncopal episodes.  She is feeling well currently.  OBJECTIVE:   BP 111/83   Pulse 100   Ht 5' 1 (1.549 m)   Wt 127 lb 9.6 oz (57.9 kg)   LMP 04/27/2013 Comment: Perimenapausal   SpO2 99%   BMI 24.11 kg/m    General: NAD, pleasant Cardiac: Well-perfused Respiratory: Normal effort   ASSESSMENT/PLAN:   Zio patch successfully applied, discussed how to use it and how to record symptoms.  Patient understands to mail it back after 14 days  Dr. Glenn Lange, DO [facility]

## 2024-03-04 NOTE — Addendum Note (Signed)
 Addended by: Sidonie Drape on: 03/04/2024 03:43 PM   Modules accepted: Level of Service

## 2024-03-13 NOTE — Progress Notes (Incomplete)
 ADVANCED HF CLINIC NOTE   Primary Care: Dr. Reche Robins [facility])  Primary Cardiologist: Dr. Delford (not seen since 2016) HF Cardiologist: Dr. Cherrie   Chief complaint: HF  HPI: Angela Serrano is a 63 y.o. woman with HTN, GERD with esophageal stricture s/p dilation in 2016 and tobacco use.  Saw Dr. Nishan in 2016 for moderate pericardial effusion found incidentally on chest CT.  TEE 1/16 EF 55-60% and normal valves. Treated with NSAIDs for pleuropericarditis.   Echo 4/21 EF 35-40% global HK grade II DD.   We saw her for the first time 01/2020. Underwent R/L cath: EF 40-45% no CAD.  Normal RHC.   cMRI 07/02/20 which showed EF up to 50% w/ diffuse mid myocardial gadolinium uptake non-specific can be seen in non ischemic DCM. RV normal.   Echo 4/23 showed EF 55-60%, G1DD, normal RV  Follow up 05/20/23, last seen 10/2020. Off most GDMT  Echo 8/24 showed EF 45-50%.  Today she returns for HF follow up. With her daughter. Still working as Conservation officer, nature at [employer]. Was having dizzy episodes so spiro cut back and now better. Also Zio placed  No edema, CP, orthopnea or PND. Smoking 1/2 ppd. Still drinking on most days - 2-3 beers/day. Daughter urging her to quit.   Echo today 03/14/24 EF 60-65% mild AI. Personally reviewed   Cardiac Studies - Echo (8/24): EF 45-50%, G1DD, normal RV - Echo (4/23): EF 55-60%, G1DD, normal RV - cMRI (10/21): LVEF 50% with diffuse HK in apex, normal RV, tri leaflet AV with mild appearing AR, non-specific diffuse mid myocardia LGE, can be seen in non-ischemic DCM  - R/LHC (5/21): normal cors, EF 40-45%, normal hemodynamics Ao = 143/76 (103) LV = 132/4 RA = 2 RV = 26/4 PA = 26/8 (17) PCW = 6 Fick cardiac output/index = 3.2/2.4 PVR = 2.9 WU Ao sat = 98% PA sat = 69%, 69%   Past Medical History:  Diagnosis Date   Acid reflux disease    Benign essential HTN    CHF (congestive heart failure), NYHA class I (HCC) 01/17/2015   Poor dentition    Seasonal  allergies 2011   sneezing and hoarseness   Current Outpatient Medications  Medication Sig Dispense Refill   atorvastatin  (LIPITOR) 40 MG tablet Take 1 tablet (40 mg total) by mouth daily. 90 tablet 3   carvedilol  (COREG ) 12.5 MG tablet Take 1 tablet (12.5 mg total) by mouth 2 (two) times daily. 180 tablet 3   FARXIGA  10 MG TABS tablet Take 1 tablet (10 mg total) by mouth daily before breakfast. 30 tablet 11   sacubitril -valsartan  (ENTRESTO ) 24-26 MG TAKE 1 TABLET BY MOUTH 2 (TWO) TIMES DAILY FOR 14 DAYS, THEN 2 TABLETS 2 (TWO) TIMES DAILY FOR 14 DAYS. 84 tablet 2   spironolactone  (ALDACTONE ) 25 MG tablet Take 0.5 tablets (12.5 mg total) by mouth daily. 90 tablet 3   No current facility-administered medications for this encounter.   No Known Allergies  Social History   Socioeconomic History   Marital status: Single    Spouse name: Not on file   Number of children: 3   Years of education: Not on file   Highest education level: Some college, no degree  Occupational History   Occupation: Armed forces operational officer: [employer]    Comment: [employer]   Tobacco Use   Smoking status: Every Day    Current packs/day: 0.50    Average packs/day: 0.5 packs/day for 45.5  years (22.7 ttl pk-yrs)    Types: Cigarettes    Start date: 09/21/1978   Smokeless tobacco: Never   Tobacco comments:    About 1/2 pack per day.  Vaping Use   Vaping status: Never Used  Substance and Sexual Activity   Alcohol use: Yes    Alcohol/week: 2.0 standard drinks of alcohol    Types: 2 Cans of beer per week    Comment: 3 beer per night for sleep   Drug use: No   Sexual activity: Not Currently    Birth control/protection: None  Other Topics Concern   Not on file  Social History Narrative   Lives with daughters, and 2 grandchildren. Total of 4 grandchildren, likes to dance for exercise. Works at [employer] and [employer] now.   Social Drivers of Corporate investment banker Strain: Low Risk  (09/04/2023)    Overall Financial Resource Strain (CARDIA)    Difficulty of Paying Living Expenses: Not very hard  Food Insecurity: No Food Insecurity (09/04/2023)   Hunger Vital Sign    Worried About Running Out of Food in the Last Year: Never true    Ran Out of Food in the Last Year: Never true  Transportation Needs: No Transportation Needs (09/04/2023)   PRAPARE - Administrator, Civil Service (Medical): No    Lack of Transportation (Non-Medical): No  Physical Activity: Insufficiently Active (09/04/2023)   Exercise Vital Sign    Days of Exercise per Week: 4 days    Minutes of Exercise per Session: 20 min  Stress: No Stress Concern Present (09/04/2023)   [employer] of Occupational Health - Occupational Stress Questionnaire    Feeling of Stress : Only a little  Social Connections: Moderately Isolated (09/04/2023)   Social Connection and Isolation Panel    Frequency of Communication with Friends and Family: Twice a week    Frequency of Social Gatherings with Friends and Family: Once a week    Attends Religious Services: More than 4 times per year    Active Member of Clubs or Organizations: No    Attends Engineer, structural: Not on file    Marital Status: Never married  Intimate Partner Violence: Not on file   Family History  Problem Relation Age of Onset   Cancer Mother 85       Bone   Alcohol abuse Father 62   Hypertension Sister    Hyperlipidemia Sister    Hypertension Sister    Thyroid  disease Daughter    Diabetes Maternal Aunt    Cancer Maternal Aunt 60       Bone    Diabetes Maternal Grandmother    Cancer Maternal Aunt 82       Bone    Wt Readings from Last 3 Encounters:  03/14/24 57.1 kg (125 lb 12.8 oz)  03/03/24 57.9 kg (127 lb 9.6 oz)  02/28/24 58.5 kg (129 lb)   Vitals:   03/14/24 0841  BP: 118/72  Pulse: 74  SpO2: 98%  Weight: 57.1 kg (125 lb 12.8 oz)  Height: 5' 1 (1.549 m)   Orthostatic VS for the past 24 hrs (Last 3 readings):  BP-  Lying Pulse- Lying BP- Sitting Pulse- Sitting BP- Standing at 0 minutes Pulse- Standing at 0 minutes  03/14/24 0922 118/64 74 110/60 74 112/66 66      PHYSICAL EXAM: General:  Well appearing. No resp difficulty HEENT: normal Neck: supple. no JVD. Carotids 2+ bilat; no bruits. No lymphadenopathy or thryomegaly  appreciated. Cor: PMI nondisplaced. Regular rate & rhythm. No rubs, gallops or murmurs. Lungs: clear Abdomen: soft, nontender, nondistended. No hepatosplenomegaly. No bruits or masses. Good bowel sounds. Extremities: no cyanosis, clubbing, rash, edema Neuro: alert & orientedx3, cranial nerves grossly intact. moves all 4 extremities w/o difficulty. Affect pleasant   ASSESSMENT & PLAN:   1. Chronic systolic HF/chest pressure - Echo (4/21): EF 35-40% global HK grade II DD. Unclear etiology suspect HTN +/- ETOH - R/LHC  (5/21): normal cors, EF 40-45% RHC ok - cMRI (10/21): LVEF 50% w/ diffuse mid myocardial gadolinium uptake non-specific can be seen in non ischemic DCM. RV normal.  - Echo (4/23): EF 55-60%, G1DD, normal RV - Echo (8/24): EF 45-50%, RV ok - Echo today 03/14/24 EF 60-65% mild AI. Personally reviewed - NYHA II. Volume ok - Continue Entresto  24/26 mg bid. - Continue spironolactone  12.5 mg daily  - Continue carvedilol  12.5 mg bid  - Continue Farxiga  10 mg daily.  - Discussed need for ETOH cessation - EF normalized can graduate HF Clinic and f/u [facility] for routine cardiac f/u  2. Severe HTN - BP improved - recently orthostatic and spiro cut back - not orthostatic today  3. ETOH use - Discussed possibility that this could be cause of reduced EF - Discussed cessation  4. Tobacco use - Still smoking ~ 1/2 ppd  - Discussed need for cessation   Mumin Denomme, MD  9:52 AM

## 2024-03-14 ENCOUNTER — Ambulatory Visit (HOSPITAL_COMMUNITY)
Admission: RE | Admit: 2024-03-14 | Discharge: 2024-03-14 | Discharge status: Home / Self Care | Source: Ambulatory Visit | Attending: Family Medicine | Admitting: Family Medicine

## 2024-03-14 ENCOUNTER — Ambulatory Visit (HOSPITAL_COMMUNITY)
Admission: RE | Admit: 2024-03-14 | Discharge: 2024-03-14 | Disposition: A | Discharge status: Home / Self Care | Source: Ambulatory Visit | Attending: Internal Medicine | Admitting: Internal Medicine

## 2024-03-14 ENCOUNTER — Encounter (HOSPITAL_COMMUNITY): Payer: Self-pay | Admitting: Internal Medicine

## 2024-03-14 VITALS — BP 118/72 | HR 74 | Ht 61.0 in | Wt 125.8 lb

## 2024-03-14 DIAGNOSIS — R0789 Other chest pain: Secondary | ICD-10-CM | POA: Diagnosis not present

## 2024-03-14 DIAGNOSIS — K219 Gastro-esophageal reflux disease without esophagitis: Secondary | ICD-10-CM | POA: Insufficient documentation

## 2024-03-14 DIAGNOSIS — Z720 Tobacco use: Secondary | ICD-10-CM

## 2024-03-14 DIAGNOSIS — K222 Esophageal obstruction: Secondary | ICD-10-CM | POA: Insufficient documentation

## 2024-03-14 DIAGNOSIS — I351 Nonrheumatic aortic (valve) insufficiency: Secondary | ICD-10-CM | POA: Diagnosis not present

## 2024-03-14 DIAGNOSIS — I5022 Chronic systolic (congestive) heart failure: Secondary | ICD-10-CM

## 2024-03-14 DIAGNOSIS — F1090 Alcohol use, unspecified, uncomplicated: Secondary | ICD-10-CM | POA: Insufficient documentation

## 2024-03-14 DIAGNOSIS — Z79899 Other long term (current) drug therapy: Secondary | ICD-10-CM | POA: Insufficient documentation

## 2024-03-14 DIAGNOSIS — Z716 Tobacco abuse counseling: Secondary | ICD-10-CM | POA: Diagnosis not present

## 2024-03-14 DIAGNOSIS — Z7984 Long term (current) use of oral hypoglycemic drugs: Secondary | ICD-10-CM | POA: Insufficient documentation

## 2024-03-14 DIAGNOSIS — I110 Hypertensive heart disease with heart failure: Secondary | ICD-10-CM | POA: Insufficient documentation

## 2024-03-14 DIAGNOSIS — I10 Essential (primary) hypertension: Secondary | ICD-10-CM

## 2024-03-14 DIAGNOSIS — F1010 Alcohol abuse, uncomplicated: Secondary | ICD-10-CM

## 2024-03-14 DIAGNOSIS — F17210 Nicotine dependence, cigarettes, uncomplicated: Secondary | ICD-10-CM | POA: Diagnosis not present

## 2024-03-14 LAB — ECHOCARDIOGRAM COMPLETE
Area-P 1/2: 4.83 cm2
Calc EF: 68.1 %
S' Lateral: 2.6 cm
Single Plane A2C EF: 68.2 %
Single Plane A4C EF: 67.4 %

## 2024-03-14 NOTE — Patient Instructions (Signed)
 CONGRATULATIONS YOU HAVE GRADUATED FROM THE ADVANCED HEART FAILURE CLINIC   Follow-Up in: PLEASE FOLLOW UP WITH [facility] IN  6 MONTHS- REFERRAL PLACED. IF YOU DON'T HEAR FROM THEM PLEASE CALL TO SCHEDULE [phone]  At the Advanced Heart Failure Clinic, you and your health needs are our priority. We have a designated team specialized in the treatment of Heart Failure. This Care Team includes your primary Heart Failure Specialized Cardiologist (physician), Advanced Practice Providers (APPs- Physician Assistants and Nurse Practitioners), and Pharmacist who all work together to provide you with the care you need, when you need it.   You may see any of the following providers on your designated Care Team at your next follow up:  Dr. Toribio Fuel Dr. Ezra Shuck Dr. Ria Commander Dr. Odis Brownie Greig Mosses, NP Caffie Shed, [place] [facility] [place], [place] Beckey Coe, NP [place] Lee, NP Tinnie Redman, PharmD   Please be sure to bring in all your medications bottles to every appointment.   Need to Contact Us :  If you have any questions or concerns before your next appointment please send us  a message through [place] or call our office at [phone].    TO LEAVE A MESSAGE FOR THE NURSE SELECT OPTION 2, PLEASE LEAVE A MESSAGE INCLUDING: YOUR NAME DATE OF BIRTH CALL BACK NUMBER REASON FOR CALL**this is important as we prioritize the call backs  YOU WILL RECEIVE A CALL BACK THE SAME DAY AS LONG AS YOU CALL BEFORE 4:00 PM

## 2024-03-15 ENCOUNTER — Ambulatory Visit (HOSPITAL_COMMUNITY): Payer: Self-pay | Admitting: Family Medicine

## 2024-03-23 ENCOUNTER — Telehealth: Payer: Self-pay | Admitting: Pharmacist

## 2024-03-23 DIAGNOSIS — F17200 Nicotine dependence, unspecified, uncomplicated: Secondary | ICD-10-CM

## 2024-03-23 NOTE — Assessment & Plan Note (Signed)
 Patient contacted for follow/up of tobacco intake reduction.   Since last contact patient reports smoking about the same Three packs per week  Discussing goal - she desires to shoot for a goal of quitting smoking completely by her Birthday - End of August (2 months). States she is smoking less during the day.    Medications currently being used;  Varenicline  - 0.5mg  BID  Patient denies any significant side effects from tobacco cessation therapy.   Rates CONFIDENCE of quitting tobacco by her birthday on 1-10 scale of 8.

## 2024-03-23 NOTE — Telephone Encounter (Signed)
 Patient contacted for follow/up of tobacco intake reduction.   Since last contact patient reports smoking about the same Three packs per week  Discussing goal - she desires to shoot for a goal of quitting smoking completely by her Birthday - End of August (2 months). States she is smoking less during the day.    Medications currently being used;  Varenicline  - 0.5mg  BID  Patient denies any significant side effects from tobacco cessation therapy.   Rates CONFIDENCE of quitting tobacco by her birthday on 1-10 scale of 8.  Total time with patient call and documentation of interaction: 12 minutes.

## 2024-03-23 NOTE — Telephone Encounter (Signed)
 Reviewed and agree with Dr Macky Lower plan.

## 2024-03-23 NOTE — Telephone Encounter (Signed)
-----   Message from Maude Lagos sent at 02/08/2024  4:41 PM EDT ----- Regarding: Intake Reduction or QUIT Goal was 2 packs or less per week.   Still using varenicline ?

## 2024-04-04 DIAGNOSIS — R55 Syncope and collapse: Secondary | ICD-10-CM | POA: Diagnosis not present

## 2024-04-07 ENCOUNTER — Telehealth: Payer: Self-pay | Admitting: Student

## 2024-04-07 NOTE — Telephone Encounter (Signed)
 Attempted to call x 2.  No answer.  Left HIPAA compliant voicemail.  Patient is active on MyChart, will message her about results of Zio patch to her MyChart.

## 2024-05-15 ENCOUNTER — Telehealth: Payer: Self-pay | Admitting: Pharmacist

## 2024-05-15 NOTE — Telephone Encounter (Signed)
 Patient contacted for follow-up of tobacco cessation  Since last contact patient reports smoking about the same  Current Medications include: varenicline  0.5 mg tablet twice daily with meals Patient denies any significant medication related side effects.  Total time with patient call and documentation of interaction: 3 minutes.  Offered to schedule a visit and patient agreed, says she will call back to schedule at a later time.  Follow-up phone call planned: 7-8 weeks

## 2024-05-15 NOTE — Telephone Encounter (Signed)
 Duplicate-opened in error.

## 2024-07-24 ENCOUNTER — Telehealth: Payer: Self-pay | Admitting: Pharmacist

## 2024-07-24 NOTE — Telephone Encounter (Signed)
 Attempted to contact patient for follow-up of tobacco use  Patient has no upcoming visits.   Left HIPAA compliant voice mail requesting call back to direct phone: [phone]  Total time with patient call and documentation of interaction: 3 minutes.  Follow-up phone call planned: 2 months.

## 2024-07-25 NOTE — Telephone Encounter (Signed)
 Reviewed and agree with Dr Rennis plan.

## 2024-10-10 ENCOUNTER — Ambulatory Visit: Payer: Self-pay | Admitting: Student

## 2024-10-10 ENCOUNTER — Encounter: Payer: Self-pay | Admitting: Student

## 2024-10-10 ENCOUNTER — Telehealth: Payer: Self-pay | Admitting: Pharmacist

## 2024-10-10 VITALS — BP 148/86 | HR 108 | Ht 61.0 in

## 2024-10-10 DIAGNOSIS — Z23 Encounter for immunization: Secondary | ICD-10-CM

## 2024-10-10 DIAGNOSIS — I5022 Chronic systolic (congestive) heart failure: Secondary | ICD-10-CM

## 2024-10-10 DIAGNOSIS — R053 Chronic cough: Secondary | ICD-10-CM

## 2024-10-10 DIAGNOSIS — Z122 Encounter for screening for malignant neoplasm of respiratory organs: Secondary | ICD-10-CM | POA: Diagnosis not present

## 2024-10-10 MED ORDER — ALBUTEROL SULFATE HFA 108 (90 BASE) MCG/ACT IN AERS: 2.0000 | INHALATION_SPRAY | Freq: Four times a day (QID) | RESPIRATORY_TRACT | 2 refills | Status: AC | PRN

## 2024-10-10 NOTE — Telephone Encounter (Signed)
 Patient contacted for follow/up of tobacco intake reduction / cessation attempt.   Request to schedule for PFT from Dr. Howell.   Since last contact patient reports smoking 1 pack every three days.   Continues to rates IMPORTANCE of quitting tobacco as high.   Most common triggers to use tobacco include; stress   PFT scheduled  10/23/2024  Total time with patient call and documentation of interaction: 12 minutes.

## 2024-10-10 NOTE — Addendum Note (Signed)
 Addended by: Reed Dady on: 10/10/2024 08:58 AM   Modules accepted: Orders

## 2024-10-10 NOTE — Patient Instructions (Signed)
 It was great to see you! Thank you for allowing me to participate in your care!   I recommend that you always bring your medications to each appointment as this makes it easy to ensure we are on the correct medications and helps us  not miss when refills are needed.  Our plans for today:  - Call [phone] to schedule cardiology appt -I have sent albuterol  inhaler, please pick this up at your pharmacy.  You can take 2 puffs every 4-6 hours as needed for cough and shortness of breath. -I have sent a referral to our pharmacy clinic, they will call you to schedule appointment to do lung testing - We ordered a CT scan of your lungs to screen for lung cancer given your smoking history, they will call to schedule appointment  Take care and seek immediate care sooner if you develop any concerns. Please remember to show up 15 minutes before your scheduled appointment time!  Gladis Church, DO [facility]

## 2024-10-10 NOTE — Progress Notes (Signed)
" ° ° °  SUBJECTIVE:   CHIEF COMPLAINT / HPI:   Discussed the use of AI scribe software for clinical note transcription with the patient, who gave verbal consent to proceed.  History of Present Illness Angela Serrano is a 64 year old female with heart failure who presents with a persistent cough and chest discomfort.  Cough - Persistent since around Christmas - Initially associated with chills and body aches, which have resolved - No fever - wheezing intermittently - Worsens with exposure to cold air  Dyspnea and wheezing - Shortness of breath and wheezing triggered by cold exposure - No prior use of inhaler  Tobacco use - Smokes approximately one pack every three days now - >20 pack years hx  Pulmonary history - 2015 CT showed mild central lower emphysema   OBJECTIVE:   BP (!) 148/86   Pulse (!) 108   Ht 5' 1 (1.549 m)   LMP 04/27/2013 Comment: Perimenapausal   SpO2 98%   BMI 23.77 kg/m    General: NAD, pleasant Cardio: RRR, no MRG.  Respiratory: CTAB, prolonged expiratory phase, normal wob on RA Skin: Warm and dry  ASSESSMENT/PLAN:   Assessment & Plan Chronic cough Differential: Postviral cough, COPD/asthma -Benign exam today, but prior CT evidence of central lobar emphysema.  Prolonged expiratory phase today. - Try albuterol  inhaler - Pharmacy referral for spirometry Chronic systolic heart failure (HCC) - Echo in June 2025 with improved ejection fraction, graduated from heart failure clinic - Reports adherence to GDMT, but did not medications today - Needs follow-up with cardiology, number provided - Follow-up in 2 weeks to recheck blood pressure Screening for malignant neoplasm of respiratory organ -Greater than 20-pack-year history, current smoker. - Low-dose CT scan ordered   Angela Church, DO [facility]  "

## 2024-10-11 NOTE — Progress Notes (Signed)
 " Cardiology Office Note:    Date:  10/14/2024   ID:  CORDA SHUTT, DOB [birth year], MRN [id number]  PCP:  Howell Lunger, DO  Cardiologist:  Lonni LITTIE Nanas, MD  Electrophysiologist:  None   Referring MD: Cherrie Toribio SAUNDERS, MD   Chief Complaint  Patient presents with   Congestive Heart Failure    History of Present Illness:    Angela Serrano is a 64 y.o. female with a hx of heart failure with recovered EF, hypertension, GERD, esophageal stricture, tobacco use who presents for an initial visit for evaluation of her heart failure.  Previously followed with advanced heart failure.  Echocardiogram 12/2019 showed EF 35 to 40%.  Underwent RHC/LHC 01/2020, normal coronary arteries.  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  Echo 12/2021 showed EF 55 to 60%.  Follow-up 04/2023 had been off both GDMT, EF 45 to 50%.  Echo 02/2024 showed EF improved to 60 to 65%.  She reports has been having burning in chest with cold weather.  Also reports some dyspnea.  Also having lightheadedness but denies any syncope.  Denies any lower extremity edema.  Reports has been off all her medications, though will take her Entresto  and carvedilol  and spironolactone  about once per week.  She is smoking 5 to 6 cigarettes/day.  She is drinking 2 beers per day.   Past Medical History:  Diagnosis Date   Acid reflux disease    Benign essential HTN    CHF (congestive heart failure), NYHA class I (HCC) 01/17/2015   Poor dentition    Seasonal allergies 2011   sneezing and hoarseness    Past Surgical History:  Procedure Laterality Date   COLONOSCOPY N/A 10/10/2014   Procedure: COLONOSCOPY;  Surgeon: Norleen LOISE Kiang, MD;  Location: [facility] ENDOSCOPY;  Service: Endoscopy;  Laterality: N/A;   ESOPHAGOGASTRODUODENOSCOPY N/A 09/25/2014   Procedure: ESOPHAGOGASTRODUODENOSCOPY (EGD);  Surgeon: Gwendlyn ONEIDA Buddy, MD;  Location: [facility] ENDOSCOPY;  Service: Endoscopy;  Laterality: N/A;   MYOMECTOMY   2004   RIGHT/LEFT HEART CATH AND CORONARY ANGIOGRAPHY N/A 02/16/2020   Procedure: RIGHT/LEFT HEART CATH AND CORONARY ANGIOGRAPHY;  Surgeon: Cherrie Toribio SAUNDERS, MD;  Location: [facility] INVASIVE CV LAB;  Service: Cardiovascular;  Laterality: N/A;   SAVORY DILATION N/A 09/25/2014   Procedure: SAVORY DILATION;  Surgeon: Gwendlyn ONEIDA Buddy, MD;  Location: [facility] ENDOSCOPY;  Service: Endoscopy;  Laterality: N/A;   TEE WITHOUT CARDIOVERSION N/A 10/10/2014   Procedure: TRANSESOPHAGEAL ECHOCARDIOGRAM (TEE);  Surgeon: Norleen LOISE Kiang, MD;  Location: [facility] ENDOSCOPY;  Service: Endoscopy;  Laterality: N/A;   TUBAL LIGATION  1990   TUBAL LIGATION Bilateral 1990    Current Medications: Active Medications[1]   Allergies:   Patient has no known allergies.   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  Vaping Use   Vaping  status: Never Used  Substance and Sexual Activity   Alcohol use: Yes    Alcohol/week: 2.0 standard drinks of alcohol    Types: 2 Cans of beer per week    Comment: 3 beer per night for sleep   Drug use: No   Sexual activity: Not Currently    Birth control/protection: None  Other Topics Concern   Not on file  Social History Narrative   Lives with daughters, and 2 grandchildren. Total of 4 grandchildren, likes to dance for exercise. Works at [employer] and [employer] now.   Social Drivers of Health   Tobacco Use: High Risk (10/13/2024)   Patient History    Smoking Tobacco Use: Every  Day    Smokeless Tobacco Use: Never    Passive Exposure: Not on file  Financial Resource Strain: Low Risk (09/04/2023)   Overall Financial Resource Strain (CARDIA)    Difficulty of Paying Living Expenses: Not very hard  Food Insecurity: No Food Insecurity (09/04/2023)   Hunger Vital Sign    Worried About Running Out of Food in the Last Year: Never true    Ran Out of Food in the Last Year: Never true  Transportation Needs: No Transportation Needs (09/04/2023)   PRAPARE - Administrator, Civil Service (Medical): No    Lack of Transportation (Non-Medical): No  Physical Activity: Insufficiently Active (09/04/2023)   Exercise Vital Sign    Days of Exercise per Week: 4 days    Minutes of Exercise per Session: 20 min  Stress: No Stress Concern Present (09/04/2023)   [employer] of Occupational Health - Occupational Stress Questionnaire    Feeling of Stress : Only a little  Social Connections: Moderately Isolated (09/04/2023)   Social Connection and Isolation Panel    Frequency of Communication with Friends and Family: Twice a week    Frequency of Social Gatherings with Friends and Family: Once a week    Attends Religious Services: More than 4 times per year    Active Member of Clubs or Organizations: No    Attends Banker Meetings: Not on file    Marital Status: Never married  Depression (PHQ2-9): Medium Risk (10/10/2024)   Depression (PHQ2-9)    PHQ-2 Score: 5  Alcohol Screen: Medium Risk (09/04/2023)   Alcohol Screen    Last Alcohol Screening Score (AUDIT): 13  Housing: Unknown (09/04/2023)   Housing Stability Vital Sign    Unable to Pay for Housing in the Last Year: No    Number of Times Moved in the Last Year: Not on file    Homeless in the Last Year: No  Utilities: Not on file  Health Literacy: Not on file     Family History: The patient's family history includes Alcohol abuse (age of onset: 19) in her father; Cancer (age of onset: 26) in her  mother; Cancer (age of onset: 60) in her maternal aunt; Cancer (age of onset: 33) in her maternal aunt; Diabetes in her maternal aunt and maternal grandmother; Hyperlipidemia in her sister; Hypertension in her sister and sister; Thyroid  disease in her daughter.  ROS:   Please see the history of present illness.     All other systems reviewed and are negative.  EKGs/Labs/Other Studies Reviewed:    The following studies were reviewed today:   EKG:   10/13/2024: Normal sinus rhythm, rate 84, no ST abnormalities  Recent Labs: 12/07/2023: ALT 18 02/28/2024: Hemoglobin 13.3; Platelets 329; TSH 2.010 10/13/2024: BUN 9; Creatinine, Ser 0.60; Magnesium 2.3;  Potassium 5.0; Sodium 143  Recent Lipid Panel    Component Value Date/Time   CHOL 220 (H) 12/07/2023 1458   CHOL 233 (H) 05/04/2023 1622   TRIG 116 12/07/2023 1458   HDL 99 12/07/2023 1458   HDL 103 05/04/2023 1622   CHOLHDL 2.2 12/07/2023 1458   VLDL 23 12/07/2023 1458   LDLCALC 98 12/07/2023 1458   LDLCALC 120 (H) 05/04/2023 1622    Physical Exam:    VS:  BP (!) 140/90 (BP Location: Right Arm, Patient Position: Sitting, Cuff Size: Normal)   Pulse 84   Ht 5' 1 (1.549 m)   Wt 125 lb 9.6 oz (57 kg)   LMP 04/27/2013 Comment: Perimenapausal   SpO2 96%   BMI 23.73 kg/m     Wt Readings from Last 3 Encounters:  10/13/24 125 lb 9.6 oz (57 kg)  03/14/24 125 lb 12.8 oz (57.1 kg)  03/03/24 127 lb 9.6 oz (57.9 kg)     GEN:  Well nourished, well developed in no acute distress HEENT: Normal NECK: No JVD; No carotid bruits LYMPHATICS: No lymphadenopathy CARDIAC: RRR, no murmurs, rubs, gallops RESPIRATORY:  Clear to auscultation without rales, wheezing or rhonchi  ABDOMEN: Soft, non-tender, non-distended MUSCULOSKELETAL:  No edema; No deformity  SKIN: Warm and dry NEUROLOGIC:  Alert and oriented x 3 PSYCHIATRIC:  Normal affect   ASSESSMENT:    1. Heart failure with recovered ejection fraction (HFrecEF) (HCC)   2. Primary  hypertension   3. Pure hypercholesterolemia   4. Tobacco use   5. Alcohol use    PLAN:    Heart failure with recovered EF: Echocardiogram 12/2019 showed EF 35 to 40%.  Underwent RHC/LHC 01/2020, normal coronary arteries.  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  Echo 12/2021 showed EF 55 to 60%.  Follow-up 04/2023 had been off both GDMT, EF 45 to 50%.  Echo 02/2024 showed EF improved to 60 to 65%.  Suspected due to hypertension  alcohol - She is supposed to be on Entresto  49-51 mg twice daily, spironolactone  12.5 mg daily, carvedilol  12.5 mg twice daily, Farxiga  10 mg daily but reports she stopped taking all her medications.  Will restart medications, but start with Entresto  24-26 mg twice daily.  Check BMET, magnesium - Recommend alcohol cessation  Hypertension: BP elevated but has been off her medications as above, will restart  Tobacco use: Counseled on the risk of tobacco use and cessation strongly recommended  Alcohol use: Alcohol cessation recommended as above  Hyperlipidemia: On atorvastatin  40 mg daily reports has been off medication, will refill  RTC in 3 months   Medication Adjustments/Labs and Tests Ordered: Current medicines are reviewed at length with the patient today.  Concerns regarding medicines are outlined above.  Orders Placed This Encounter  Procedures   Basic metabolic panel with GFR   Magnesium   EKG 12-Lead   Meds ordered this encounter  Medications   atorvastatin  (LIPITOR) 40 MG tablet    Sig: Take 1 tablet (40 mg total) by mouth daily.    Dispense:  90 tablet    Refill:  3   FARXIGA  10 MG TABS tablet    Sig: Take 1 tablet (10 mg total) by mouth daily before breakfast.    Dispense:  90 tablet    Refill:  3   sacubitril -valsartan  (ENTRESTO ) 24-26 MG    Sig: Take 1 tablet by mouth 2 (two) times daily.    Dispense:  180 tablet  Refill:  2   carvedilol  (COREG ) 12.5 MG tablet    Sig: Take 1 tablet (12.5  mg total) by mouth 2 (two) times daily.    Dispense:  180 tablet    Refill:  3    Please cancel all previous orders for current medication. Change in dosage or pill size.    Patient Instructions  Medication Instructions:  RESTART ALL CARDIAC MEDICATIONS:  ATORVASTATIN  40 MG DAILY CARVEDILOL  12.5 MG TWICE DAILY FARXIGA  10 MG DAILY DECREASE ENTRESTO  TO 24-26 MG 1 TABLET DAILY *If you need a refill on your cardiac medications before your next appointment, please call your pharmacy*  Lab Work: BMET AND MAGNESIUM TODAY If you have labs (blood work) drawn today and your tests are completely normal, you will receive your results only by: MyChart Message (if you have MyChart) OR A paper copy in the mail If you have any lab test that is abnormal or we need to change your treatment, we will call you to review the results.  Testing/Procedures: NO TESTING  Follow-Up: At [facility], you and your health needs are our priority.  As part of our continuing mission to provide you with exceptional heart care, our providers are all part of one team.  This team includes your primary Cardiologist (physician) and Advanced Practice Providers or APPs (Physician Assistants and Nurse Practitioners) who all work together to provide you with the care you need, when you need it.  Your next appointment:   3 month(s)  Provider:   Lonni LITTIE Nanas, MD   Other Instructions           Signed, Lonni LITTIE Nanas, MD  10/14/2024 8:55 PM    [facility]     [1]  Current Meds  Medication Sig   albuterol  (VENTOLIN  HFA) 108 (90 Base) MCG/ACT inhaler Inhale 2 puffs into the lungs every 6 (six) hours as needed for wheezing or shortness of breath.   spironolactone  (ALDACTONE ) 25 MG tablet Take 0.5 tablets (12.5 mg total) by mouth daily.   varenicline  (CHANTIX ) 0.5 MG tablet Take 0.5 mg by mouth 2 (two) times daily.   [DISCONTINUED] atorvastatin  (LIPITOR) 40 MG  tablet Take 1 tablet (40 mg total) by mouth daily.   [DISCONTINUED] carvedilol  (COREG ) 12.5 MG tablet Take 1 tablet (12.5 mg total) by mouth 2 (two) times daily.   [DISCONTINUED] FARXIGA  10 MG TABS tablet Take 1 tablet (10 mg total) by mouth daily before breakfast.   [DISCONTINUED] sacubitril -valsartan  (ENTRESTO ) 24-26 MG TAKE 1 TABLET BY MOUTH 2 (TWO) TIMES DAILY FOR 14 DAYS, THEN 2 TABLETS 2 (TWO) TIMES DAILY FOR 14 DAYS.   "

## 2024-10-11 NOTE — Telephone Encounter (Signed)
 Reviewed and agree with Dr Rennis plan.

## 2024-10-13 ENCOUNTER — Encounter: Payer: Self-pay | Admitting: Cardiology

## 2024-10-13 ENCOUNTER — Other Ambulatory Visit (HOSPITAL_COMMUNITY): Payer: Self-pay | Admitting: Internal Medicine

## 2024-10-13 ENCOUNTER — Other Ambulatory Visit: Payer: Self-pay | Admitting: Cardiology

## 2024-10-13 ENCOUNTER — Ambulatory Visit: Attending: Cardiology | Admitting: Cardiology

## 2024-10-13 ENCOUNTER — Ambulatory Visit: Admission: RE | Admit: 2024-10-13 | Discharge: 2024-10-13 | Disposition: A | Source: Ambulatory Visit

## 2024-10-13 VITALS — BP 140/90 | HR 84 | Ht 61.0 in | Wt 125.6 lb

## 2024-10-13 DIAGNOSIS — Z122 Encounter for screening for malignant neoplasm of respiratory organs: Secondary | ICD-10-CM

## 2024-10-13 DIAGNOSIS — I5020 Unspecified systolic (congestive) heart failure: Secondary | ICD-10-CM | POA: Diagnosis present

## 2024-10-13 DIAGNOSIS — I10 Essential (primary) hypertension: Secondary | ICD-10-CM | POA: Diagnosis present

## 2024-10-13 DIAGNOSIS — E7800 Pure hypercholesterolemia, unspecified: Secondary | ICD-10-CM | POA: Diagnosis present

## 2024-10-13 DIAGNOSIS — F1090 Alcohol use, unspecified, uncomplicated: Secondary | ICD-10-CM | POA: Diagnosis present

## 2024-10-13 DIAGNOSIS — Z720 Tobacco use: Secondary | ICD-10-CM | POA: Diagnosis present

## 2024-10-13 LAB — MAGNESIUM: Magnesium: 2.3 mg/dL (ref 1.6–2.3)

## 2024-10-13 LAB — BASIC METABOLIC PANEL WITH GFR
BUN/Creatinine Ratio: 15 (ref 12–28)
BUN: 9 mg/dL (ref 8–27)
CO2: 22 mmol/L (ref 20–29)
Calcium: 9.4 mg/dL (ref 8.7–10.3)
Chloride: 105 mmol/L (ref 96–106)
Creatinine, Ser: 0.6 mg/dL (ref 0.57–1.00)
Glucose: 81 mg/dL (ref 70–99)
Potassium: 5 mmol/L (ref 3.5–5.2)
Sodium: 143 mmol/L (ref 134–144)
eGFR: 102 mL/min/1.73

## 2024-10-13 MED ORDER — ATORVASTATIN CALCIUM 40 MG PO TABS: 40.0000 mg | ORAL_TABLET | Freq: Every day | ORAL | 3 refills | Status: AC

## 2024-10-13 MED ORDER — FARXIGA 10 MG PO TABS: 10.0000 mg | ORAL_TABLET | Freq: Every day | ORAL | 3 refills | Status: DC

## 2024-10-13 MED ORDER — CARVEDILOL 12.5 MG PO TABS: 12.5000 mg | ORAL_TABLET | Freq: Two times a day (BID) | ORAL | 3 refills | Status: AC

## 2024-10-13 MED ORDER — SACUBITRIL-VALSARTAN 24-26 MG PO TABS: 1.0000 | ORAL_TABLET | Freq: Two times a day (BID) | ORAL | 2 refills | Status: DC

## 2024-10-13 NOTE — Patient Instructions (Signed)
 Medication Instructions:  RESTART ALL CARDIAC MEDICATIONS:  ATORVASTATIN  40 MG DAILY CARVEDILOL  12.5 MG TWICE DAILY FARXIGA  10 MG DAILY DECREASE ENTRESTO  TO 24-26 MG 1 TABLET DAILY *If you need a refill on your cardiac medications before your next appointment, please call your pharmacy*  Lab Work: BMET AND MAGNESIUM TODAY If you have labs (blood work) drawn today and your tests are completely normal, you will receive your results only by: MyChart Message (if you have MyChart) OR A paper copy in the mail If you have any lab test that is abnormal or we need to change your treatment, we will call you to review the results.  Testing/Procedures: NO TESTING  Follow-Up: At [facility], you and your health needs are our priority.  As part of our continuing mission to provide you with exceptional heart care, our providers are all part of one team.  This team includes your primary Cardiologist (physician) and Advanced Practice Providers or APPs (Physician Assistants and Nurse Practitioners) who all work together to provide you with the care you need, when you need it.  Your next appointment:   3 month(s)  Provider:   Lonni LITTIE Nanas, MD   Other Instructions

## 2024-10-15 ENCOUNTER — Ambulatory Visit: Payer: Self-pay | Admitting: Cardiology

## 2024-10-15 DIAGNOSIS — Z79899 Other long term (current) drug therapy: Secondary | ICD-10-CM

## 2024-10-15 DIAGNOSIS — I5020 Unspecified systolic (congestive) heart failure: Secondary | ICD-10-CM

## 2024-10-19 ENCOUNTER — Telehealth: Payer: Self-pay

## 2024-10-19 NOTE — Telephone Encounter (Signed)
 Received call from [facility] regarding patient's CT Lung Cancer screening results.   See below.   IMPRESSION: Lung-RADS 4A, suspicious. Follow up low-dose chest CT without contrast in 3 months (please use the following order, CT CHEST LCS NODULE FOLLOW-UP W/O CM) is recommended. Alternatively, PET may be considered when there is a solid component 8mm or larger. Irregular, linear nodule in the posterior right upper lobe measuring 11.1 mm, which may represent an area of scarring.  Forwarding to PCP.   Chiquita JAYSON English, RN

## 2024-10-20 NOTE — Telephone Encounter (Signed)
 BMP Normal 10/13/24

## 2024-10-23 ENCOUNTER — Ambulatory Visit: Admitting: Pharmacist

## 2024-10-24 ENCOUNTER — Ambulatory Visit: Payer: Self-pay | Admitting: Student

## 2024-10-24 DIAGNOSIS — R918 Other nonspecific abnormal finding of lung field: Secondary | ICD-10-CM

## 2024-10-24 MED ORDER — DAPAGLIFLOZIN PROPANEDIOL 10 MG PO TABS: 10.0000 mg | ORAL_TABLET | Freq: Every day | ORAL | 3 refills | Status: AC

## 2024-10-25 ENCOUNTER — Ambulatory Visit

## 2024-10-25 VITALS — BP 126/62 | HR 85 | Temp 97.9°F | Ht 61.0 in | Wt 125.8 lb

## 2024-10-25 DIAGNOSIS — F17200 Nicotine dependence, unspecified, uncomplicated: Secondary | ICD-10-CM

## 2024-10-25 DIAGNOSIS — J439 Emphysema, unspecified: Secondary | ICD-10-CM

## 2024-10-25 DIAGNOSIS — R911 Solitary pulmonary nodule: Secondary | ICD-10-CM

## 2024-10-25 MED ORDER — BREZTRI AEROSPHERE 160-9-4.8 MCG/ACT IN AERO: 2.0000 | INHALATION_SPRAY | Freq: Two times a day (BID) | RESPIRATORY_TRACT | Status: AC

## 2024-10-25 MED ORDER — BUDESONIDE-FORMOTEROL FUMARATE 160-4.5 MCG/ACT IN AERO: 2.0000 | INHALATION_SPRAY | Freq: Two times a day (BID) | RESPIRATORY_TRACT | 6 refills | Status: AC

## 2024-10-25 NOTE — Progress Notes (Unsigned)
 "  New Patient Pulmonology Office Visit   Subjective:  Patient ID: Angela Serrano, female    DOB: [birth year]  MRN: [id number]  Referred by: McDiarmid, Krystal BIRCH, MD  CC:  Chief Complaint  Patient presents with   Consult    Review CT scan from 10/19/2024.  Patient c/o cough with mucus production x one month    HPI Angela Serrano is a 64 y.o. female with ***  Discussed the use of AI scribe software for clinical note transcription with the patient, who gave verbal consent to proceed.  History of Present Illness Angela Serrano is a 64 year old female who presents with a persistent cough and for lung cancer screening. She was referred by her regular doctor for lung cancer screening due to a persistent cough and history of smoking.  She has been experiencing a persistent cough since contracting a virus around Christmas. The cough is severe, particularly at night, causing her to wake up with tearing and a choking sensation. Although slightly improved, it remains bothersome, especially in public settings. She also experiences mucus production and a 'nasty' taste associated with the cough.  She has a significant smoking history, having smoked since she was about 64 years old, totaling approximately forty years. Currently, she smokes about one pack every three days, having reduced from a previous rate of about half a pack to one pack per day.  A CT scan performed in January was done as part of a lung cancer screening program due to her smoking history. A previous CT scan in 2015 was performed to rule out a clot in her chest after she experienced chest pain. The recent scan was part of a lung cancer screening program due to her smoking history.  She uses DayQuil and NightQuil for symptom relief and reports some benefit. She also uses honey for her cough. No known allergies, but she mentions sinus issues following dental work that affected her right sinus.  Family history is notable for a brother with asthma  and allergies.     {PULM QUESTIONNAIRES (Optional):33196}  ROS  Allergies: Patient has no known allergies. Current Medications[1] Past Medical History:  Diagnosis Date   Acid reflux disease    Benign essential HTN    CHF (congestive heart failure), NYHA class I (HCC) 01/17/2015   Poor dentition    Seasonal allergies 2011   sneezing and hoarseness   Past Surgical History:  Procedure Laterality Date   COLONOSCOPY N/A 10/10/2014   Procedure: COLONOSCOPY;  Surgeon: Norleen LOISE Kiang, MD;  Location: [facility] ENDOSCOPY;  Service: Endoscopy;  Laterality: N/A;   ESOPHAGOGASTRODUODENOSCOPY N/A 09/25/2014   Procedure: ESOPHAGOGASTRODUODENOSCOPY (EGD);  Surgeon: Gwendlyn ONEIDA Buddy, MD;  Location: [facility] ENDOSCOPY;  Service: Endoscopy;  Laterality: N/A;   MYOMECTOMY  2004   RIGHT/LEFT HEART CATH AND CORONARY ANGIOGRAPHY N/A 02/16/2020   Procedure: RIGHT/LEFT HEART CATH AND CORONARY ANGIOGRAPHY;  Surgeon: Cherrie Toribio SAUNDERS, MD;  Location: [facility] INVASIVE CV LAB;  Service: Cardiovascular;  Laterality: N/A;   SAVORY DILATION N/A 09/25/2014   Procedure: SAVORY DILATION;  Surgeon: Gwendlyn ONEIDA Buddy, MD;  Location: [facility] ENDOSCOPY;  Service: Endoscopy;  Laterality: N/A;   TEE WITHOUT CARDIOVERSION N/A 10/10/2014   Procedure: TRANSESOPHAGEAL ECHOCARDIOGRAM (TEE);  Surgeon: Norleen LOISE Kiang, MD;  Location: [facility] ENDOSCOPY;  Service: Endoscopy;  Laterality: N/A;   TUBAL LIGATION  1990   TUBAL LIGATION Bilateral 1990   Family History  Problem Relation Age of Onset   Cancer Mother 36  Bone   Alcohol abuse Father 37   Hypertension Sister    Hyperlipidemia Sister    Hypertension Sister    Thyroid  disease Daughter    Diabetes Maternal Aunt    Cancer Maternal Aunt 72       Bone    Diabetes Maternal Grandmother    Cancer Maternal Aunt 96       Bone    Social History   Socioeconomic History   Marital status: Single    Spouse name: Not on file   Number of children: 3   Years of education: Not on  file   Highest education level: Some college, no degree  Occupational History   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  Vaping Use   Vaping status: Never Used  Substance and Sexual Activity   Alcohol use: Yes    Alcohol/week: 2.0 standard drinks of alcohol    Types: 2 Cans of beer per week    Comment: 3 beer per night for sleep   Drug use: No   Sexual activity: Not Currently    Birth control/protection: None  Other Topics Concern   Not on file  Social History Narrative   Lives with daughters, and 2 grandchildren. Total of 4 grandchildren, likes to dance for exercise. Works at [employer] and [employer] now.   Social Drivers of Health   Tobacco Use: High Risk (10/25/2024)   Patient History    Smoking Tobacco Use: Every Day    Smokeless Tobacco Use: Never    Passive Exposure: Not on file  Financial Resource Strain: Low Risk (09/04/2023)   Overall Financial Resource Strain (CARDIA)    Difficulty of Paying Living Expenses: Not very hard  Food Insecurity: No Food Insecurity (09/04/2023)   Hunger Vital Sign    Worried About Running Out of Food in the Last Year: Never true    Ran Out of Food in the Last Year: Never true  Transportation Needs: No Transportation Needs (09/04/2023)   PRAPARE - Administrator, Civil Service (Medical): No    Lack of Transportation (Non-Medical): No  Physical Activity: Insufficiently Active (09/04/2023)   Exercise Vital Sign    Days of Exercise per Week: 4 days    Minutes of Exercise per Session: 20 min  Stress: No Stress Concern Present (09/04/2023)   [employer] of Occupational Health - Occupational Stress Questionnaire     Feeling of Stress : Only a little  Social Connections: Moderately Isolated (09/04/2023)   Social Connection and Isolation Panel    Frequency of Communication with Friends and Family: Twice a week    Frequency of Social Gatherings with Friends and Family: Once a week    Attends Religious Services: More than 4 times per year    Active Member of Clubs or Organizations: No    Attends Banker Meetings: Not on file    Marital Status: Never married  Intimate Partner Violence: Not on file  Depression (PHQ2-9): Medium Risk (10/10/2024)   Depression (PHQ2-9)    PHQ-2 Score: 5  Alcohol Screen: Medium Risk (09/04/2023)   Alcohol Screen  Last Alcohol Screening Score (AUDIT): 13  Housing: Unknown (09/04/2023)   Housing Stability Vital Sign    Unable to Pay for Housing in the Last Year: No    Number of Times Moved in the Last Year: Not on file    Homeless in the Last Year: No  Utilities: Not on file  Health Literacy: Not on file         Objective:  BP 126/62   Pulse 85   Temp 97.9 F (36.6 C) (Temporal)   Ht 5' 1 (1.549 m)   Wt 125 lb 12.8 oz (57.1 kg)   LMP 04/27/2013 Comment: Perimenapausal   SpO2 98% Comment: room air  BMI 23.77 kg/m  {Pulm Vitals (Optional):32837}  Physical Exam  Diagnostic Review:  {Labs (Optional):32838}  Pft     No data to display               Results Radiology Chest CT (low dose) (10/19/2024): Emphysema and multiple irregular nodules in the right upper lobe, including a part-solid nodule and adjacent smaller nodules; no dense solid lesions. (Independently interpreted) Chest CT (contrast) (2015): No pulmonary embolism; emphysema present; no right lung nodule. (Independently interpreted)      Assessment & Plan:   Assessment & Plan    Thank you for the opportunity to take part in the care of Danecia L Dewalt   No follow-ups on file.   Sayward Horvath Pleas, MD [facility] Office: [phone]       [1]  Current Outpatient Medications:    albuterol  (VENTOLIN  HFA) 108 (90 Base) MCG/ACT inhaler, Inhale 2 puffs into the lungs every 6 (six) hours as needed for wheezing or shortness of breath., Disp: 8 g, Rfl: 2   atorvastatin  (LIPITOR) 40 MG tablet, Take 1 tablet (40 mg total) by mouth daily., Disp: 90 tablet, Rfl: 3   carvedilol  (COREG ) 12.5 MG tablet, Take 1 tablet (12.5 mg total) by mouth 2 (two) times daily., Disp: 180 tablet, Rfl: 3   dapagliflozin  propanediol (FARXIGA ) 10 MG TABS tablet, Take 1 tablet (10 mg total) by mouth daily before breakfast., Disp: 30 tablet, Rfl: 3   sacubitril -valsartan  (ENTRESTO ) 24-26 MG, TAKE 1 TABLET BY MOUTH TWICE A DAY, Disp: 180 tablet, Rfl: 3   spironolactone  (ALDACTONE ) 25 MG tablet, Take 0.5 tablets (12.5 mg total) by mouth daily., Disp: 90 tablet, Rfl: 3   varenicline  (CHANTIX ) 0.5 MG tablet, Take 0.5 mg by mouth 2 (two) times daily. (Patient not taking: Reported on 10/25/2024), Disp: , Rfl:  "

## 2024-10-25 NOTE — Patient Instructions (Signed)
" °  VISIT SUMMARY: Today, we discussed your persistent cough and performed a lung cancer screening due to your smoking history. We reviewed your CT scan results and addressed your chronic cough and emphysema.  YOUR PLAN: PULMONARY NODULE, RIGHT LUNG: Irregular nodules in your right upper lung were found on your CT scan. These nodules are not immediately concerning for cancer. -We will monitor the nodules with a follow-up CT scan in 8-10  weeks. -If the nodules persist or grow, we may need to consider a biopsy.  CHRONIC OBSTRUCTIVE PULMONARY DISEASE WITH EMPHYSEMA: Your chronic cough and emphysema are likely related to your long-term smoking history and may be worsened by a recent viral infection. -You have been prescribed an inhaler to manage your COPD. -Please rinse your mouth after using the inhaler to prevent thrush. -We have ordered a breathing test to assess your lung function. -Consider using over-the-counter allergy medication to reduce airway inflammation.  TOBACCO USE DISORDER: Your long-term smoking history is impacting your health. Reducing and quitting smoking is crucial. -We encourage you to quit smoking. -We discussed the potential use of Chantix  to help you quit smoking. -Support and resources for smoking cessation have been provided.    Contains text generated by [employer].   "

## 2024-10-27 NOTE — Assessment & Plan Note (Signed)
 Strongly encouraged patient to quit smoking altogether Orders:   Pulmonary function test; Future

## 2024-11-30 ENCOUNTER — Encounter

## 2024-12-04 ENCOUNTER — Ambulatory Visit

## 2024-12-21 ENCOUNTER — Other Ambulatory Visit

## 2025-01-29 ENCOUNTER — Ambulatory Visit: Admitting: Cardiology
